# Patient Record
Sex: Male | Born: 1960 | Race: White | Hispanic: No | Smoking: Current some day smoker
Health system: Southern US, Community
[De-identification: ages and names within clinical notes are randomized; demographics above are authoritative.]

## PROBLEM LIST (undated history)

## (undated) DIAGNOSIS — I700 Atherosclerosis of aorta: Secondary | ICD-10-CM

## (undated) DIAGNOSIS — M8448XA Pathological fracture, other site, initial encounter for fracture: Secondary | ICD-10-CM

## (undated) DIAGNOSIS — M25552 Pain in left hip: Secondary | ICD-10-CM

## (undated) DIAGNOSIS — M109 Gout, unspecified: Secondary | ICD-10-CM

## (undated) DIAGNOSIS — H3552 Pigmentary retinal dystrophy: Secondary | ICD-10-CM

## (undated) DIAGNOSIS — K219 Gastro-esophageal reflux disease without esophagitis: Secondary | ICD-10-CM

## (undated) DIAGNOSIS — G8929 Other chronic pain: Secondary | ICD-10-CM

## (undated) DIAGNOSIS — E119 Type 2 diabetes mellitus without complications: Secondary | ICD-10-CM

## (undated) DIAGNOSIS — H547 Unspecified visual loss: Secondary | ICD-10-CM

## (undated) DIAGNOSIS — G4730 Sleep apnea, unspecified: Secondary | ICD-10-CM

## (undated) DIAGNOSIS — I10 Essential (primary) hypertension: Secondary | ICD-10-CM

## (undated) DIAGNOSIS — E785 Hyperlipidemia, unspecified: Secondary | ICD-10-CM

## (undated) DIAGNOSIS — M545 Low back pain: Secondary | ICD-10-CM

## (undated) DIAGNOSIS — F419 Anxiety disorder, unspecified: Secondary | ICD-10-CM

## (undated) HISTORY — DX: Gastro-esophageal reflux disease without esophagitis: K21.9

## (undated) HISTORY — PX: WRIST SURGERY: SHX841

## (undated) HISTORY — DX: Type 2 diabetes mellitus without complications: E11.9

## (undated) HISTORY — DX: Hyperlipidemia, unspecified: E78.5

## (undated) HISTORY — PX: EYE SURGERY: SHX253

## (undated) HISTORY — DX: Essential (primary) hypertension: I10

## (undated) HISTORY — DX: Anxiety disorder, unspecified: F41.9

## (undated) HISTORY — PX: TONSILLECTOMY: SUR1361

## (undated) HISTORY — PX: TIBIA FRACTURE SURGERY: SHX806

## (undated) HISTORY — DX: Atherosclerosis of aorta: I70.0

## (undated) HISTORY — DX: Low back pain: M54.5

## (undated) HISTORY — PX: MULTIPLE TOOTH EXTRACTIONS: SHX2053

## (undated) HISTORY — DX: Pain in left hip: M25.552

## (undated) HISTORY — DX: Other chronic pain: G89.29

## (undated) HISTORY — DX: Hypomagnesemia: E83.42

## (undated) HISTORY — DX: Pathological fracture, other site, initial encounter for fracture: M84.48XA

---

## 1990-10-30 HISTORY — PX: CATARACT EXTRACTION: SUR2

## 1998-06-03 ENCOUNTER — Emergency Department (HOSPITAL_COMMUNITY): Admission: EM | Admit: 1998-06-03 | Discharge: 1998-06-03 | Payer: Self-pay | Admitting: Emergency Medicine

## 1998-09-10 ENCOUNTER — Ambulatory Visit (HOSPITAL_COMMUNITY): Admission: RE | Admit: 1998-09-10 | Discharge: 1998-09-10 | Payer: Self-pay | Admitting: *Deleted

## 1998-09-10 ENCOUNTER — Encounter: Payer: Self-pay | Admitting: *Deleted

## 1999-02-21 ENCOUNTER — Encounter: Payer: Self-pay | Admitting: *Deleted

## 1999-02-21 ENCOUNTER — Ambulatory Visit (HOSPITAL_COMMUNITY): Admission: RE | Admit: 1999-02-21 | Discharge: 1999-02-22 | Payer: Self-pay | Admitting: *Deleted

## 1999-10-31 HISTORY — PX: LAMINECTOMY: SHX219

## 1999-10-31 HISTORY — PX: BACK SURGERY: SHX140

## 1999-12-19 ENCOUNTER — Ambulatory Visit (HOSPITAL_COMMUNITY): Admission: RE | Admit: 1999-12-19 | Discharge: 1999-12-19 | Payer: Self-pay | Admitting: *Deleted

## 1999-12-19 ENCOUNTER — Encounter: Payer: Self-pay | Admitting: *Deleted

## 2000-01-02 ENCOUNTER — Ambulatory Visit (HOSPITAL_COMMUNITY): Admission: RE | Admit: 2000-01-02 | Discharge: 2000-01-03 | Payer: Self-pay | Admitting: *Deleted

## 2000-01-02 ENCOUNTER — Encounter: Payer: Self-pay | Admitting: *Deleted

## 2000-02-22 ENCOUNTER — Encounter: Payer: Self-pay | Admitting: *Deleted

## 2000-02-22 ENCOUNTER — Ambulatory Visit (HOSPITAL_COMMUNITY): Admission: RE | Admit: 2000-02-22 | Discharge: 2000-02-22 | Payer: Self-pay | Admitting: *Deleted

## 2000-06-24 ENCOUNTER — Encounter: Payer: Self-pay | Admitting: *Deleted

## 2000-06-24 ENCOUNTER — Ambulatory Visit (HOSPITAL_COMMUNITY): Admission: RE | Admit: 2000-06-24 | Discharge: 2000-06-24 | Payer: Self-pay | Admitting: *Deleted

## 2000-07-13 ENCOUNTER — Ambulatory Visit (HOSPITAL_COMMUNITY): Admission: RE | Admit: 2000-07-13 | Discharge: 2000-07-13 | Payer: Self-pay | Admitting: *Deleted

## 2000-07-13 ENCOUNTER — Encounter: Payer: Self-pay | Admitting: *Deleted

## 2004-08-22 ENCOUNTER — Ambulatory Visit: Payer: Self-pay | Admitting: Physician Assistant

## 2006-05-24 ENCOUNTER — Emergency Department: Payer: Self-pay | Admitting: Emergency Medicine

## 2006-06-02 ENCOUNTER — Emergency Department: Payer: Self-pay | Admitting: Emergency Medicine

## 2006-07-31 ENCOUNTER — Ambulatory Visit: Payer: Self-pay | Admitting: Specialist

## 2007-09-18 ENCOUNTER — Ambulatory Visit: Payer: Self-pay | Admitting: Specialist

## 2010-02-15 ENCOUNTER — Emergency Department: Payer: Self-pay | Admitting: Unknown Physician Specialty

## 2011-11-18 ENCOUNTER — Emergency Department: Payer: Self-pay | Admitting: Emergency Medicine

## 2011-11-18 LAB — COMPREHENSIVE METABOLIC PANEL
Albumin: 4.6 g/dL (ref 3.4–5.0)
Alkaline Phosphatase: 57 U/L (ref 50–136)
Anion Gap: 10 (ref 7–16)
BUN: 9 mg/dL (ref 7–18)
Bilirubin,Total: 0.5 mg/dL (ref 0.2–1.0)
Calcium, Total: 8.6 mg/dL (ref 8.5–10.1)
Chloride: 103 mmol/L (ref 98–107)
Co2: 30 mmol/L (ref 21–32)
Creatinine: 0.94 mg/dL (ref 0.60–1.30)
EGFR (African American): >60
EGFR (Non-African Amer.): >60
Glucose: 121 mg/dL — ABNORMAL HIGH (ref 65–99)
Osmolality: 285 (ref 275–301)
Potassium: 3.7 mmol/L (ref 3.5–5.1)
SGOT(AST): 56 U/L — ABNORMAL HIGH (ref 15–37)
SGPT (ALT): 52 U/L
Sodium: 143 mmol/L (ref 136–145)
Total Protein: 8.2 g/dL (ref 6.4–8.2)

## 2011-11-18 LAB — CBC
HCT: 45.8 % (ref 40.0–52.0)
HGB: 15.9 g/dL (ref 13.0–18.0)
MCH: 30.8 pg (ref 26.0–34.0)
MCHC: 34.8 g/dL (ref 32.0–36.0)
MCV: 89 fL (ref 80–100)
Platelet: 211 10*3/uL (ref 150–440)
RBC: 5.16 10*6/uL (ref 4.40–5.90)
RDW: 12.9 % (ref 11.5–14.5)
WBC: 9.7 10*3/uL (ref 3.8–10.6)

## 2011-11-18 LAB — ETHANOL
Ethanol %: 0.245 % — ABNORMAL HIGH (ref 0.000–0.080)
Ethanol: 245 mg/dL

## 2011-11-18 LAB — TSH: Thyroid Stimulating Horm: 2.79 u[IU]/mL

## 2011-11-19 LAB — URINALYSIS, COMPLETE
Bacteria: NONE SEEN
Bilirubin,UR: NEGATIVE
Blood: NEGATIVE
Glucose,UR: NEGATIVE mg/dL (ref 0–75)
Ketone: NEGATIVE
Leukocyte Esterase: NEGATIVE
Nitrite: NEGATIVE
Ph: 5 (ref 4.5–8.0)
Protein: NEGATIVE
RBC,UR: NONE SEEN /HPF (ref 0–5)
Specific Gravity: 1.002 (ref 1.003–1.030)
Squamous Epithelial: NONE SEEN
WBC UR: <1 /HPF (ref 0–5)

## 2011-11-19 LAB — DRUG SCREEN, URINE

## 2012-04-19 ENCOUNTER — Ambulatory Visit: Payer: Self-pay | Admitting: Unknown Physician Specialty

## 2012-04-22 LAB — PATHOLOGY REPORT

## 2012-08-23 ENCOUNTER — Ambulatory Visit: Payer: Self-pay | Admitting: Pain Medicine

## 2013-11-17 ENCOUNTER — Ambulatory Visit: Payer: Self-pay | Admitting: Unknown Physician Specialty

## 2013-11-20 LAB — PATHOLOGY REPORT

## 2014-12-11 DIAGNOSIS — E1169 Type 2 diabetes mellitus with other specified complication: Secondary | ICD-10-CM | POA: Diagnosis not present

## 2014-12-11 DIAGNOSIS — M545 Low back pain: Secondary | ICD-10-CM | POA: Diagnosis not present

## 2014-12-11 DIAGNOSIS — I10 Essential (primary) hypertension: Secondary | ICD-10-CM | POA: Diagnosis not present

## 2014-12-11 DIAGNOSIS — G8929 Other chronic pain: Secondary | ICD-10-CM | POA: Diagnosis not present

## 2014-12-11 DIAGNOSIS — E781 Pure hyperglyceridemia: Secondary | ICD-10-CM | POA: Diagnosis not present

## 2014-12-28 DIAGNOSIS — Z79899 Other long term (current) drug therapy: Secondary | ICD-10-CM | POA: Diagnosis not present

## 2014-12-28 DIAGNOSIS — M47816 Spondylosis without myelopathy or radiculopathy, lumbar region: Secondary | ICD-10-CM | POA: Diagnosis not present

## 2014-12-28 DIAGNOSIS — M545 Low back pain: Secondary | ICD-10-CM | POA: Diagnosis not present

## 2014-12-28 DIAGNOSIS — M25562 Pain in left knee: Secondary | ICD-10-CM | POA: Diagnosis not present

## 2014-12-28 DIAGNOSIS — F419 Anxiety disorder, unspecified: Secondary | ICD-10-CM | POA: Diagnosis not present

## 2014-12-28 DIAGNOSIS — G8929 Other chronic pain: Secondary | ICD-10-CM | POA: Diagnosis not present

## 2014-12-30 DIAGNOSIS — M160 Bilateral primary osteoarthritis of hip: Secondary | ICD-10-CM | POA: Diagnosis not present

## 2014-12-30 DIAGNOSIS — M47816 Spondylosis without myelopathy or radiculopathy, lumbar region: Secondary | ICD-10-CM | POA: Diagnosis not present

## 2014-12-30 DIAGNOSIS — M25562 Pain in left knee: Secondary | ICD-10-CM | POA: Diagnosis not present

## 2014-12-30 DIAGNOSIS — G8929 Other chronic pain: Secondary | ICD-10-CM | POA: Diagnosis not present

## 2015-01-06 DIAGNOSIS — M4806 Spinal stenosis, lumbar region: Secondary | ICD-10-CM | POA: Diagnosis not present

## 2015-01-06 DIAGNOSIS — M2578 Osteophyte, vertebrae: Secondary | ICD-10-CM | POA: Diagnosis not present

## 2015-01-06 DIAGNOSIS — M545 Low back pain: Secondary | ICD-10-CM | POA: Diagnosis not present

## 2015-01-06 DIAGNOSIS — M5440 Lumbago with sciatica, unspecified side: Secondary | ICD-10-CM | POA: Diagnosis not present

## 2015-01-06 DIAGNOSIS — M47816 Spondylosis without myelopathy or radiculopathy, lumbar region: Secondary | ICD-10-CM | POA: Diagnosis not present

## 2015-01-06 DIAGNOSIS — M1990 Unspecified osteoarthritis, unspecified site: Secondary | ICD-10-CM | POA: Diagnosis not present

## 2015-02-02 DIAGNOSIS — Z5181 Encounter for therapeutic drug level monitoring: Secondary | ICD-10-CM | POA: Diagnosis not present

## 2015-02-02 DIAGNOSIS — G8929 Other chronic pain: Secondary | ICD-10-CM | POA: Diagnosis not present

## 2015-02-02 DIAGNOSIS — F1120 Opioid dependence, uncomplicated: Secondary | ICD-10-CM | POA: Diagnosis not present

## 2015-02-02 DIAGNOSIS — Z79891 Long term (current) use of opiate analgesic: Secondary | ICD-10-CM | POA: Diagnosis not present

## 2015-02-02 DIAGNOSIS — R748 Abnormal levels of other serum enzymes: Secondary | ICD-10-CM | POA: Diagnosis not present

## 2015-02-16 DIAGNOSIS — M5416 Radiculopathy, lumbar region: Secondary | ICD-10-CM | POA: Diagnosis not present

## 2015-02-16 DIAGNOSIS — M25559 Pain in unspecified hip: Secondary | ICD-10-CM | POA: Diagnosis not present

## 2015-02-16 DIAGNOSIS — G9619 Other disorders of meninges, not elsewhere classified: Secondary | ICD-10-CM | POA: Diagnosis not present

## 2015-02-16 DIAGNOSIS — Z79891 Long term (current) use of opiate analgesic: Secondary | ICD-10-CM | POA: Diagnosis not present

## 2015-02-16 DIAGNOSIS — M545 Low back pain: Secondary | ICD-10-CM | POA: Diagnosis not present

## 2015-02-16 DIAGNOSIS — G8929 Other chronic pain: Secondary | ICD-10-CM | POA: Diagnosis not present

## 2015-03-11 DIAGNOSIS — E785 Hyperlipidemia, unspecified: Secondary | ICD-10-CM | POA: Diagnosis not present

## 2015-03-11 DIAGNOSIS — E669 Obesity, unspecified: Secondary | ICD-10-CM | POA: Diagnosis not present

## 2015-03-11 DIAGNOSIS — I10 Essential (primary) hypertension: Secondary | ICD-10-CM | POA: Diagnosis not present

## 2015-03-11 DIAGNOSIS — E781 Pure hyperglyceridemia: Secondary | ICD-10-CM | POA: Diagnosis not present

## 2015-03-11 DIAGNOSIS — E119 Type 2 diabetes mellitus without complications: Secondary | ICD-10-CM | POA: Diagnosis not present

## 2015-03-17 DIAGNOSIS — M94251 Chondromalacia, right hip: Secondary | ICD-10-CM | POA: Diagnosis not present

## 2015-03-17 DIAGNOSIS — M8700 Idiopathic aseptic necrosis of unspecified bone: Secondary | ICD-10-CM | POA: Diagnosis not present

## 2015-03-17 DIAGNOSIS — M87051 Idiopathic aseptic necrosis of right femur: Secondary | ICD-10-CM | POA: Diagnosis not present

## 2015-03-17 DIAGNOSIS — M94252 Chondromalacia, left hip: Secondary | ICD-10-CM | POA: Diagnosis not present

## 2015-03-17 DIAGNOSIS — R609 Edema, unspecified: Secondary | ICD-10-CM | POA: Diagnosis not present

## 2015-03-17 DIAGNOSIS — M87052 Idiopathic aseptic necrosis of left femur: Secondary | ICD-10-CM | POA: Diagnosis not present

## 2015-06-11 ENCOUNTER — Ambulatory Visit (INDEPENDENT_AMBULATORY_CARE_PROVIDER_SITE_OTHER): Payer: Medicare Other | Admitting: Family Medicine

## 2015-06-11 ENCOUNTER — Encounter: Payer: Self-pay | Admitting: Family Medicine

## 2015-06-11 VITALS — BP 128/82 | HR 59 | Temp 97.9°F | Resp 20 | Ht 68.0 in | Wt 246.7 lb

## 2015-06-11 DIAGNOSIS — E119 Type 2 diabetes mellitus without complications: Secondary | ICD-10-CM | POA: Diagnosis not present

## 2015-06-11 DIAGNOSIS — G8929 Other chronic pain: Secondary | ICD-10-CM

## 2015-06-11 DIAGNOSIS — E1140 Type 2 diabetes mellitus with diabetic neuropathy, unspecified: Secondary | ICD-10-CM | POA: Insufficient documentation

## 2015-06-11 DIAGNOSIS — E785 Hyperlipidemia, unspecified: Secondary | ICD-10-CM | POA: Diagnosis not present

## 2015-06-11 DIAGNOSIS — G4701 Insomnia due to medical condition: Secondary | ICD-10-CM

## 2015-06-11 DIAGNOSIS — F5105 Insomnia due to other mental disorder: Secondary | ICD-10-CM | POA: Insufficient documentation

## 2015-06-11 DIAGNOSIS — I10 Essential (primary) hypertension: Secondary | ICD-10-CM | POA: Insufficient documentation

## 2015-06-11 DIAGNOSIS — H3552 Pigmentary retinal dystrophy: Secondary | ICD-10-CM | POA: Insufficient documentation

## 2015-06-11 DIAGNOSIS — M5416 Radiculopathy, lumbar region: Secondary | ICD-10-CM

## 2015-06-11 DIAGNOSIS — F411 Generalized anxiety disorder: Secondary | ICD-10-CM | POA: Diagnosis not present

## 2015-06-11 DIAGNOSIS — F419 Anxiety disorder, unspecified: Secondary | ICD-10-CM | POA: Insufficient documentation

## 2015-06-11 DIAGNOSIS — E781 Pure hyperglyceridemia: Secondary | ICD-10-CM | POA: Diagnosis not present

## 2015-06-11 DIAGNOSIS — IMO0001 Reserved for inherently not codable concepts without codable children: Secondary | ICD-10-CM | POA: Insufficient documentation

## 2015-06-11 MED ORDER — ATENOLOL 100 MG PO TABS: 100.0000 mg | ORAL_TABLET | Freq: Every day | ORAL | Status: DC

## 2015-06-11 MED ORDER — TIZANIDINE HCL 4 MG PO TABS: 4.0000 mg | ORAL_TABLET | Freq: Every day | ORAL | Status: DC

## 2015-06-11 MED ORDER — LISINOPRIL 2.5 MG PO TABS: 2.5000 mg | ORAL_TABLET | Freq: Every day | ORAL | Status: DC

## 2015-06-11 MED ORDER — CLONAZEPAM 0.5 MG PO TABS: 0.5000 mg | ORAL_TABLET | Freq: Every evening | ORAL | Status: DC | PRN

## 2015-06-11 MED ORDER — OMEGA-3-ACID ETHYL ESTERS 1 G PO CAPS: 2.0000 g | ORAL_CAPSULE | Freq: Two times a day (BID) | ORAL | Status: DC

## 2015-06-11 MED ORDER — ATORVASTATIN CALCIUM 20 MG PO TABS: 20.0000 mg | ORAL_TABLET | Freq: Every day | ORAL | Status: DC

## 2015-06-11 MED ORDER — ESOMEPRAZOLE MAGNESIUM 40 MG PO CPDR: 40.0000 mg | DELAYED_RELEASE_CAPSULE | Freq: Every day | ORAL | Status: DC

## 2015-06-11 MED ORDER — METFORMIN HCL 1000 MG PO TABS: 1000.0000 mg | ORAL_TABLET | Freq: Two times a day (BID) | ORAL | Status: DC

## 2015-06-11 MED ORDER — CLONAZEPAM 1 MG PO TABS: 1.0000 mg | ORAL_TABLET | Freq: Two times a day (BID) | ORAL | Status: DC | PRN

## 2015-06-11 NOTE — Progress Notes (Signed)
Name: Joel Bryant   MRN: [id number]    DOB: Jan 31, 1961   Date:06/11/2015       Progress Note  Subjective  Chief Complaint  Chief Complaint  Patient presents with  . Follow-up    3 mo/medication refill  . Anxiety  . Hyperlipidemia  . Diabetes    Anxiety Presents for follow-up visit. The problem has been unchanged (Pt. was prescribed Clonazepam 0.5mg  BID at his last visit and he was originally on Clonazepam 1mg  BID PRN.). Symptoms include excessive worry and insomnia (difficulty falling asleep, but clonazepam helps with insomnia.). Patient reports no chest pain, nausea, palpitations, panic or shortness of breath. The severity of symptoms is moderate.   Past treatments include benzodiazephines. The treatment provided moderate relief. Compliance with prior treatments has been good.  Hyperlipidemia This is a chronic problem. Recent lipid tests were reviewed and are high. Exacerbating diseases include diabetes and obesity. Pertinent negatives include no chest pain or shortness of breath. Current antihyperlipidemic treatment includes statins.  Diabetes He presents for his follow-up diabetic visit. He has type 2 diabetes mellitus. Pertinent negatives for hypoglycemia include no headaches. Pertinent negatives for diabetes include no chest pain, no foot paresthesias, no polydipsia and no polyuria. Pertinent negatives for diabetic complications include no CVA. Current diabetic treatment includes oral agent (monotherapy). His weight is stable. An ACE inhibitor/angiotensin II receptor blocker is being taken. Eye exam is current.  Hypertension This is a chronic problem. The problem is controlled. Associated symptoms include anxiety. Pertinent negatives include no chest pain, headaches, palpitations or shortness of breath. Past treatments include beta blockers and ACE inhibitors. There is no history of kidney disease, CAD/MI or CVA.  Heartburn He complains of heartburn. He reports no abdominal pain, no  chest pain, no choking, no nausea or no sore throat. The symptoms are aggravated by certain foods. Risk factors include obesity. He has tried a PPI for the symptoms. The treatment provided significant relief.  Back Pain This is a chronic problem. The pain is present in the gluteal and lumbar spine. The pain radiates to the left thigh and right thigh. The pain is at a severity of 4/10. The pain is moderate. The symptoms are aggravated by position and bending. Pertinent negatives include no abdominal pain, bladder incontinence, chest pain or headaches. He has tried analgesics and muscle relaxant (Pt. is on Morphine by Pain Management) for the symptoms. The treatment provided moderate relief.     Past Medical History  Diagnosis Date  . Diabetes mellitus without complication   . Anxiety   . Hyperlipidemia   . Hypertension     Past Surgical History  Procedure Laterality Date  . Laminectomy  2001    lumbar  . Cataract extraction  1992    Insert Prosthetic Lens    Family History  Problem Relation Age of Onset  . Colon polyps Mother   . Retinitis pigmentosa Mother   . Retinitis pigmentosa Maternal Grandmother   . Cancer Maternal Grandfather   . Diabetes Paternal Grandmother     Social History   Social History  . Marital Status: Divorced    Spouse Name: N/A  . Number of Children: N/A  . Years of Education: N/A   Occupational History  . Not on file.   Social History Main Topics  . Smoking status: Former Smoker    Types: Cigarettes  . Smokeless tobacco: Never Used     Comment: Quit smoking 4 years now  . Alcohol Use: 0.0 oz/week  0 Standard drinks or equivalent per week     Comment: Sober 1 month and 12 days   . Drug Use: No  . Sexual Activity: Not on file   Other Topics Concern  . Not on file   Social History Narrative  . No narrative on file     Current outpatient prescriptions:  .  atenolol (TENORMIN) 100 MG tablet, Take 1 tablet by mouth daily., Disp: , Rfl:    .  atorvastatin (LIPITOR) 20 MG tablet, Take 1 tablet by mouth at bedtime., Disp: , Rfl:  .  clonazePAM (KLONOPIN) 1 MG tablet, Take 1 tablet by mouth 2 (two) times daily as needed., Disp: , Rfl:  .  esomeprazole (NEXIUM) 40 MG capsule, Take 1 capsule by mouth daily., Disp: , Rfl:  .  furosemide (LASIX) 40 MG tablet, Take by mouth., Disp: , Rfl:  .  lisinopril (PRINIVIL,ZESTRIL) 2.5 MG tablet, Take 1 tablet by mouth daily., Disp: , Rfl:  .  metFORMIN (GLUCOPHAGE) 1000 MG tablet, Take 1 tablet by mouth 2 (two) times daily., Disp: , Rfl:  .  omega-3 acid ethyl esters (LOVAZA) 1 G capsule, Take 2 capsules by mouth 2 (two) times daily., Disp: , Rfl:  .  tiZANidine (ZANAFLEX) 4 MG tablet, Take 1 tablet by mouth daily., Disp: , Rfl:   No Known Allergies   Review of Systems  HENT: Negative for sore throat.   Respiratory: Negative for choking and shortness of breath.   Cardiovascular: Negative for chest pain and palpitations.  Gastrointestinal: Positive for heartburn. Negative for nausea and abdominal pain.  Genitourinary: Negative for bladder incontinence.  Musculoskeletal: Positive for back pain.  Neurological: Negative for headaches.  Endo/Heme/Allergies: Negative for polydipsia.  Psychiatric/Behavioral: The patient has insomnia (difficulty falling asleep, but clonazepam helps with insomnia.).      Objective  Filed Vitals:   06/11/15 1112  BP: 128/82  Pulse: 59  Temp: 97.9 F (36.6 C)  TempSrc: Oral  Resp: 20  Height: 5\' 8"  (1.727 m)  Weight: 246 lb 11.2 oz (111.902 kg)  SpO2: 96%    Physical Exam  Constitutional: He is oriented to person, place, and time and well-developed, well-nourished, and in no distress.  Cardiovascular: Normal rate and regular rhythm.   Pulmonary/Chest: Effort normal and breath sounds normal.  Abdominal: Soft. Bowel sounds are normal.  Musculoskeletal:       Back:  Neurological: He is alert and oriented to person, place, and time.  Skin: Skin is  warm and dry.  Psychiatric: Memory, affect and judgment normal.  Nursing note and vitals reviewed.   Assessment & Plan  1. Essential hypertension BP stable and controlled on present therapy. - atenolol (TENORMIN) 100 MG tablet; Take 1 tablet (100 mg total) by mouth daily.  Dispense: 90 tablet; Refill: 1  2. Diabetes mellitus without complication  - lisinopril (PRINIVIL,ZESTRIL) 2.5 MG tablet; Take 1 tablet (2.5 mg total) by mouth daily.  Dispense: 90 tablet; Refill: 1 - metFORMIN (GLUCOPHAGE) 1000 MG tablet; Take 1 tablet (1,000 mg total) by mouth 2 (two) times daily.  Dispense: 180 tablet; Refill: 1 - HgB A1c  3. Generalized anxiety disorder Restarted back on clonazepam 1 mg twice a day as needed. Patient is educated in detail about the potential interactions between benzodiazepines and opioids (he is on morphine, prescribed by pain clinic for chronic low back pain). He is also aware of the dependence potential of benzodiazepines and is taking the medication as directed and only as needed. Refills provided  and follow-up in 3 months. - clonazePAM (KLONOPIN) 1 MG tablet; Take 1 tablet (1 mg total) by mouth 2 (two) times daily as needed.  Dispense: 60 tablet; Refill: 2  4. Hypertriglyceridemia  - omega-3 acid ethyl esters (LOVAZA) 1 G capsule; Take 2 capsules (2 g total) by mouth 2 (two) times daily.  Dispense: 360 capsule; Refill: 1  5. Dyslipidemia  - atorvastatin (LIPITOR) 20 MG tablet; Take 1 tablet (20 mg total) by mouth daily at 6 PM.  Dispense: 90 tablet; Refill: 1 - Lipid Profile - Comprehensive metabolic panel  6. Lumbar radiculopathy Followed by pain clinic and on morphine. Takes tizanidine for relief of muscle spasm. - tiZANidine (ZANAFLEX) 4 MG tablet; Take 1 tablet (4 mg total) by mouth daily.  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 He is on clonazepam  1 mg twice a day for anxiety. He is aware of the dependence potential and the interactions of benzodiazepines especially with opioids. Follow-up in 3 months. - clonazePAM (KLONOPIN) 0.5 MG tablet; Take 1 tablet (0.5 mg total) by mouth at bedtime as needed for anxiety.  Dispense: 30 tablet; Refill: 2   Dillion Stowers [facility] 06/11/2015 11:31 AM

## 2015-07-15 ENCOUNTER — Telehealth: Payer: Self-pay | Admitting: Family Medicine

## 2015-07-15 NOTE — Telephone Encounter (Signed)
Completely out Furosemide. Please send to [facility]. Last seen on 03-11-15 next appointment 09-13-15

## 2015-07-19 ENCOUNTER — Other Ambulatory Visit: Payer: Self-pay | Admitting: Family Medicine

## 2015-07-19 DIAGNOSIS — R600 Localized edema: Secondary | ICD-10-CM

## 2015-07-19 MED ORDER — FUROSEMIDE 40 MG PO TABS: 40.0000 mg | ORAL_TABLET | Freq: Every day | ORAL | Status: DC

## 2015-07-19 NOTE — Telephone Encounter (Signed)
Patient completely out requesting refill.Joel Bryant

## 2015-07-19 NOTE — Telephone Encounter (Signed)
Pt said that they have been calling last week to try and get his fluid pills refilled. He is out of them and needs them. phamr is [facility]

## 2015-07-21 NOTE — Telephone Encounter (Signed)
Medication has been refilled and sent to [facility] on 07/19/2015 by Dr. Manuella Ghazi

## 2015-09-13 ENCOUNTER — Ambulatory Visit (INDEPENDENT_AMBULATORY_CARE_PROVIDER_SITE_OTHER): Payer: Medicare Other | Admitting: Family Medicine

## 2015-09-13 ENCOUNTER — Encounter: Payer: Self-pay | Admitting: Family Medicine

## 2015-09-13 ENCOUNTER — Ambulatory Visit: Payer: Medicare Other | Admitting: Family Medicine

## 2015-09-13 VITALS — BP 128/80 | HR 60 | Temp 98.5°F | Resp 16 | Ht 68.0 in | Wt 237.0 lb

## 2015-09-13 DIAGNOSIS — F411 Generalized anxiety disorder: Secondary | ICD-10-CM | POA: Diagnosis not present

## 2015-09-13 DIAGNOSIS — M545 Low back pain, unspecified: Secondary | ICD-10-CM

## 2015-09-13 DIAGNOSIS — E785 Hyperlipidemia, unspecified: Secondary | ICD-10-CM

## 2015-09-13 DIAGNOSIS — I10 Essential (primary) hypertension: Secondary | ICD-10-CM

## 2015-09-13 DIAGNOSIS — E1165 Type 2 diabetes mellitus with hyperglycemia: Secondary | ICD-10-CM | POA: Diagnosis not present

## 2015-09-13 DIAGNOSIS — IMO0001 Reserved for inherently not codable concepts without codable children: Secondary | ICD-10-CM

## 2015-09-13 DIAGNOSIS — G8929 Other chronic pain: Secondary | ICD-10-CM

## 2015-09-13 DIAGNOSIS — Z23 Encounter for immunization: Secondary | ICD-10-CM

## 2015-09-13 DIAGNOSIS — G4701 Insomnia due to medical condition: Secondary | ICD-10-CM | POA: Diagnosis not present

## 2015-09-13 DIAGNOSIS — R600 Localized edema: Secondary | ICD-10-CM | POA: Insufficient documentation

## 2015-09-13 DIAGNOSIS — M109 Gout, unspecified: Secondary | ICD-10-CM | POA: Insufficient documentation

## 2015-09-13 DIAGNOSIS — G4733 Obstructive sleep apnea (adult) (pediatric): Secondary | ICD-10-CM | POA: Insufficient documentation

## 2015-09-13 HISTORY — DX: Low back pain, unspecified: M54.50

## 2015-09-13 HISTORY — DX: Low back pain, unspecified: G89.29

## 2015-09-13 LAB — GLUCOSE, POCT (MANUAL RESULT ENTRY): POC Glucose: 222 mg/dl — AB (ref 70–99)

## 2015-09-13 LAB — POCT GLYCOSYLATED HEMOGLOBIN (HGB A1C): Hemoglobin A1C: 8.6

## 2015-09-13 MED ORDER — SITAGLIPTIN PHOSPHATE 50 MG PO TABS: 50.0000 mg | ORAL_TABLET | Freq: Every day | ORAL | Status: DC

## 2015-09-13 MED ORDER — CLONAZEPAM 0.5 MG PO TABS: 0.5000 mg | ORAL_TABLET | Freq: Every evening | ORAL | Status: DC | PRN

## 2015-09-13 MED ORDER — CLONAZEPAM 1 MG PO TABS: 1.0000 mg | ORAL_TABLET | Freq: Two times a day (BID) | ORAL | Status: DC | PRN

## 2015-09-13 NOTE — Progress Notes (Signed)
Name: Joel Bryant   MRN: [id number]    DOB: September 27, 1961   Date:09/13/2015       Progress Note  Subjective  Chief Complaint  Chief Complaint  Patient presents with  . Follow-up    3 mo  . Diabetes  . Hyperlipidemia  . Medication Refill    Diabetes He presents for his follow-up diabetic visit. He has type 2 diabetes mellitus. His disease course has been worsening (BG increaed in September to over 400, coupled with foot swelling. This coincided with patient's decision to quit alcohol and reduce consumption of sugars.). Hypoglycemia symptoms include headaches (having headaches with elevated sugars) and nervousness/anxiousness. Pertinent negatives for diabetes include no chest pain, no fatigue, no polydipsia and no polyuria. Pertinent negatives for diabetic complications include no CVA. Current diabetic treatment includes oral agent (monotherapy). His weight is stable. He is following a diabetic and generally healthy diet. His breakfast blood glucose range is generally >200 mg/dl. An ACE inhibitor/angiotensin II receptor blocker is being taken.  Hyperlipidemia This is a chronic problem. The problem is controlled. Exacerbating diseases include obesity. Pertinent negatives include no chest pain, leg pain, myalgias or shortness of breath. Current antihyperlipidemic treatment includes statins.  Hypertension This is a chronic problem. The problem is controlled. Associated symptoms include anxiety and headaches (having headaches with elevated sugars). Pertinent negatives include no chest pain, palpitations or shortness of breath. Past treatments include beta blockers and ACE inhibitors. There is no history of kidney disease, CAD/MI or CVA.  Anxiety Presents for follow-up visit. Symptoms include insomnia and nervous/anxious behavior. Patient reports no chest pain, depressed mood, palpitations or shortness of breath. The quality of sleep is good.   Past treatments include benzodiazephines. The treatment  provided significant relief. Compliance with prior treatments has been good.      Past Medical History  Diagnosis Date  . Diabetes mellitus without complication ([place])   . Anxiety   . Hyperlipidemia   . Hypertension     Past Surgical History  Procedure Laterality Date  . Laminectomy  2001    lumbar  . Cataract extraction  1992    Insert Prosthetic Lens    Family History  Problem Relation Age of Onset  . Colon polyps Mother   . Retinitis pigmentosa Mother   . Retinitis pigmentosa Maternal Grandmother   . Cancer Maternal Grandfather   . Diabetes Paternal Grandmother     Social History   Social History  . Marital Status: Divorced    Spouse Name: N/A  . Number of Children: N/A  . Years of Education: N/A   Occupational History  . Not on file.   Social History Main Topics  . Smoking status: Former Smoker    Types: Cigarettes  . Smokeless tobacco: Never Used     Comment: Quit smoking 4 years now  . Alcohol Use: 0.0 oz/week    0 Standard drinks or equivalent per week     Comment: Sober 1 month and 12 days   . Drug Use: No  . Sexual Activity: Not on file   Other Topics Concern  . Not on file   Social History Narrative     Current outpatient prescriptions:  .  atenolol (TENORMIN) 100 MG tablet, Take 1 tablet (100 mg total) by mouth daily., Disp: 90 tablet, Rfl: 1 .  atorvastatin (LIPITOR) 20 MG tablet, Take 1 tablet (20 mg total) by mouth daily at 6 PM., Disp: 90 tablet, Rfl: 1 .  clonazePAM (KLONOPIN) 0.5 MG tablet, Take  1 tablet (0.5 mg total) by mouth at bedtime as needed for anxiety., Disp: 30 tablet, Rfl: 2 .  clonazePAM (KLONOPIN) 1 MG tablet, Take 1 tablet (1 mg total) by mouth 2 (two) times daily as needed., Disp: 60 tablet, Rfl: 2 .  esomeprazole (NEXIUM) 40 MG capsule, Take 1 capsule (40 mg total) by mouth daily., Disp: 90 capsule, Rfl: 1 .  furosemide (LASIX) 40 MG tablet, Take 1 tablet (40 mg total) by mouth daily., Disp: 30 tablet, Rfl: 2 .   lisinopril (PRINIVIL,ZESTRIL) 2.5 MG tablet, Take 1 tablet (2.5 mg total) by mouth daily., Disp: 90 tablet, Rfl: 1 .  metFORMIN (GLUCOPHAGE) 1000 MG tablet, Take 1 tablet (1,000 mg total) by mouth 2 (two) times daily., Disp: 180 tablet, Rfl: 1 .  omega-3 acid ethyl esters (LOVAZA) 1 G capsule, Take 2 capsules (2 g total) by mouth 2 (two) times daily., Disp: 360 capsule, Rfl: 1 .  tiZANidine (ZANAFLEX) 4 MG tablet, Take 1 tablet (4 mg total) by mouth daily., Disp: 90 tablet, Rfl: 1  No Known Allergies   Review of Systems  Constitutional: Negative for fatigue.  Respiratory: Negative for shortness of breath.   Cardiovascular: Negative for chest pain and palpitations.  Gastrointestinal: Negative for abdominal pain.  Musculoskeletal: Negative for myalgias.  Neurological: Positive for headaches (having headaches with elevated sugars).  Endo/Heme/Allergies: Negative for polydipsia.  Psychiatric/Behavioral: The patient is nervous/anxious and has insomnia.     Objective  Filed Vitals:   09/13/15 0919  BP: 128/80  Pulse: 60  Temp: 98.5 F (36.9 C)  TempSrc: Oral  Resp: 16  Height: 5\' 8"  (1.727 m)  Weight: 237 lb (107.502 kg)  SpO2: 97%    Physical Exam  Constitutional: He is oriented to person, place, and time and well-developed, well-nourished, and in no distress.  HENT:  Head: Normocephalic and atraumatic.  Cardiovascular: Normal rate, regular rhythm and normal heart sounds.   Pulmonary/Chest: Effort normal and breath sounds normal.  Abdominal: Soft. Bowel sounds are normal. There is no tenderness.  Neurological: He is alert and oriented to person, place, and time.  Skin: Skin is warm and dry.  Psychiatric: Mood, memory, affect and judgment normal.  Nursing note and vitals reviewed.   Recent Results (from the past 2160 hour(s))  POCT HgB A1C     Status: Abnormal   Collection Time: 09/13/15  9:30 AM  Result Value Ref Range   Hemoglobin A1C 8.6   POCT Glucose (CBG)      Status: Abnormal   Collection Time: 09/13/15  9:30 AM  Result Value Ref Range   POC Glucose 222 (A) 70 - 99 mg/dl     Assessment & Plan  1. Need for immunization against influenza  - Flu Vaccine QUAD 36+ mos PF IM (Fluarix & Fluzone Quad PF)  2. Essential hypertension BP stable and controlled on present therapy.  3. Uncontrolled type 2 diabetes mellitus without complication, without long-term current use of insulin (HCC) Worse with elevated blood sugars. Unclear etiology. He shouldn't has quit alcohol and has decreased sugar consumption and diabetes has gotten worse. We will start on Januvia 50 mg daily. Educated on potential adverse effects. Encouraged to adopt a regular exercise routine. Recheck in 3-4 months. - POCT HgB A1C - POCT Glucose (CBG) - sitaGLIPtin (JANUVIA) 50 MG tablet; Take 1 tablet (50 mg total) by mouth daily.  Dispense: 90 tablet; Refill: 0  4. Generalized anxiety disorder Stable on benzodiazepine therapy. Refills provided. - clonazePAM (KLONOPIN) 1 MG  tablet; Take 1 tablet (1 mg total) by mouth 2 (two) times daily as needed.  Dispense: 180 tablet; Refill: 0  5. Insomnia secondary to chronic pain Stable and responsive to benzodiazepine therapy. Patient aware of the dependence potential associated with benzodiazepines, there are side effects especially in relation to opioid use. He is compliant with the controlled substances agreement - clonazePAM (KLONOPIN) 0.5 MG tablet; Take 1 tablet (0.5 mg total) by mouth at bedtime as needed for anxiety.  Dispense: 90 tablet; Refill: 0  6. Dyslipidemia  - Lipid Profile - Comprehensive Metabolic Panel (CMET)   Aundra Espin [facility] 09/13/2015 9:50 AM

## 2015-09-14 LAB — LIPID PANEL
Chol/HDL Ratio: 3.3 ratio units (ref 0.0–5.0)
Cholesterol, Total: 136 mg/dL (ref 100–199)
HDL: 41 mg/dL (ref >39)
LDL Calculated: 45 mg/dL (ref 0–99)
Triglycerides: 251 mg/dL — ABNORMAL HIGH (ref 0–149)
VLDL Cholesterol Cal: 50 mg/dL — ABNORMAL HIGH (ref 5–40)

## 2015-09-14 LAB — COMPREHENSIVE METABOLIC PANEL
ALT: 31 IU/L (ref 0–44)
AST: 46 IU/L — ABNORMAL HIGH (ref 0–40)
Albumin/Globulin Ratio: 2.2 (ref 1.1–2.5)
Albumin: 4.9 g/dL (ref 3.5–5.5)
Alkaline Phosphatase: 69 IU/L (ref 39–117)
BUN/Creatinine Ratio: 13 (ref 9–20)
BUN: 8 mg/dL (ref 6–24)
Bilirubin Total: 1.1 mg/dL (ref 0.0–1.2)
CO2: 25 mmol/L (ref 18–29)
Calcium: 9.5 mg/dL (ref 8.7–10.2)
Chloride: 92 mmol/L — ABNORMAL LOW (ref 97–106)
Creatinine, Ser: 0.62 mg/dL — ABNORMAL LOW (ref 0.76–1.27)
GFR calc Af Amer: 129 mL/min/{1.73_m2} (ref >59)
GFR calc non Af Amer: 112 mL/min/{1.73_m2} (ref >59)
Globulin, Total: 2.2 g/dL (ref 1.5–4.5)
Glucose: 216 mg/dL — ABNORMAL HIGH (ref 65–99)
Potassium: 5 mmol/L (ref 3.5–5.2)
Sodium: 138 mmol/L (ref 136–144)
Total Protein: 7.1 g/dL (ref 6.0–8.5)

## 2015-09-15 ENCOUNTER — Encounter: Payer: Self-pay | Admitting: Pain Medicine

## 2015-09-22 ENCOUNTER — Encounter: Payer: Self-pay | Admitting: Pain Medicine

## 2015-09-27 ENCOUNTER — Ambulatory Visit: Payer: Medicare Other | Attending: Pain Medicine | Admitting: Pain Medicine

## 2015-09-27 ENCOUNTER — Encounter: Payer: Self-pay | Admitting: Pain Medicine

## 2015-09-27 ENCOUNTER — Other Ambulatory Visit
Admission: RE | Admit: 2015-09-27 | Discharge: 2015-09-27 | Disposition: A | Discharge status: Home / Self Care | Payer: Medicare Other | Source: Ambulatory Visit | Attending: Pain Medicine | Admitting: Pain Medicine

## 2015-09-27 ENCOUNTER — Other Ambulatory Visit: Payer: Self-pay | Admitting: Pain Medicine

## 2015-09-27 VITALS — BP 132/79 | HR 58 | Temp 97.9°F | Resp 18 | Ht 69.0 in | Wt 230.0 lb

## 2015-09-27 DIAGNOSIS — G8929 Other chronic pain: Secondary | ICD-10-CM | POA: Diagnosis not present

## 2015-09-27 DIAGNOSIS — E785 Hyperlipidemia, unspecified: Secondary | ICD-10-CM | POA: Insufficient documentation

## 2015-09-27 DIAGNOSIS — Z5181 Encounter for therapeutic drug level monitoring: Secondary | ICD-10-CM | POA: Insufficient documentation

## 2015-09-27 DIAGNOSIS — Z79899 Other long term (current) drug therapy: Secondary | ICD-10-CM

## 2015-09-27 DIAGNOSIS — M25559 Pain in unspecified hip: Secondary | ICD-10-CM | POA: Diagnosis not present

## 2015-09-27 DIAGNOSIS — Z713 Dietary counseling and surveillance: Secondary | ICD-10-CM | POA: Insufficient documentation

## 2015-09-27 DIAGNOSIS — G9619 Other disorders of meninges, not elsewhere classified: Secondary | ICD-10-CM | POA: Diagnosis not present

## 2015-09-27 DIAGNOSIS — M5441 Lumbago with sciatica, right side: Secondary | ICD-10-CM

## 2015-09-27 DIAGNOSIS — F1190 Opioid use, unspecified, uncomplicated: Secondary | ICD-10-CM | POA: Diagnosis not present

## 2015-09-27 DIAGNOSIS — M4726 Other spondylosis with radiculopathy, lumbar region: Secondary | ICD-10-CM

## 2015-09-27 DIAGNOSIS — M48061 Spinal stenosis, lumbar region without neurogenic claudication: Secondary | ICD-10-CM | POA: Insufficient documentation

## 2015-09-27 DIAGNOSIS — M961 Postlaminectomy syndrome, not elsewhere classified: Secondary | ICD-10-CM | POA: Insufficient documentation

## 2015-09-27 DIAGNOSIS — M545 Low back pain, unspecified: Secondary | ICD-10-CM

## 2015-09-27 DIAGNOSIS — Z87891 Personal history of nicotine dependence: Secondary | ICD-10-CM | POA: Diagnosis not present

## 2015-09-27 DIAGNOSIS — K219 Gastro-esophageal reflux disease without esophagitis: Secondary | ICD-10-CM | POA: Diagnosis not present

## 2015-09-27 DIAGNOSIS — E8881 Metabolic syndrome: Secondary | ICD-10-CM | POA: Insufficient documentation

## 2015-09-27 DIAGNOSIS — F1120 Opioid dependence, uncomplicated: Secondary | ICD-10-CM | POA: Diagnosis not present

## 2015-09-27 DIAGNOSIS — M879 Osteonecrosis, unspecified: Secondary | ICD-10-CM | POA: Insufficient documentation

## 2015-09-27 DIAGNOSIS — M47816 Spondylosis without myelopathy or radiculopathy, lumbar region: Secondary | ICD-10-CM | POA: Insufficient documentation

## 2015-09-27 DIAGNOSIS — M5442 Lumbago with sciatica, left side: Secondary | ICD-10-CM

## 2015-09-27 DIAGNOSIS — Z86010 Personal history of colonic polyps: Secondary | ICD-10-CM | POA: Insufficient documentation

## 2015-09-27 DIAGNOSIS — I10 Essential (primary) hypertension: Secondary | ICD-10-CM | POA: Insufficient documentation

## 2015-09-27 DIAGNOSIS — E669 Obesity, unspecified: Secondary | ICD-10-CM | POA: Insufficient documentation

## 2015-09-27 DIAGNOSIS — Z79891 Long term (current) use of opiate analgesic: Secondary | ICD-10-CM | POA: Insufficient documentation

## 2015-09-27 DIAGNOSIS — F419 Anxiety disorder, unspecified: Secondary | ICD-10-CM | POA: Diagnosis not present

## 2015-09-27 DIAGNOSIS — G894 Chronic pain syndrome: Secondary | ICD-10-CM | POA: Diagnosis not present

## 2015-09-27 DIAGNOSIS — M47896 Other spondylosis, lumbar region: Secondary | ICD-10-CM | POA: Insufficient documentation

## 2015-09-27 DIAGNOSIS — Z23 Encounter for immunization: Secondary | ICD-10-CM | POA: Insufficient documentation

## 2015-09-27 DIAGNOSIS — M7989 Other specified soft tissue disorders: Secondary | ICD-10-CM | POA: Insufficient documentation

## 2015-09-27 DIAGNOSIS — IMO0001 Reserved for inherently not codable concepts without codable children: Secondary | ICD-10-CM | POA: Insufficient documentation

## 2015-09-27 DIAGNOSIS — G96198 Other disorders of meninges, not elsewhere classified: Secondary | ICD-10-CM | POA: Insufficient documentation

## 2015-09-27 DIAGNOSIS — M539 Dorsopathy, unspecified: Secondary | ICD-10-CM

## 2015-09-27 DIAGNOSIS — E119 Type 2 diabetes mellitus without complications: Secondary | ICD-10-CM | POA: Insufficient documentation

## 2015-09-27 DIAGNOSIS — E1169 Type 2 diabetes mellitus with other specified complication: Secondary | ICD-10-CM | POA: Insufficient documentation

## 2015-09-27 DIAGNOSIS — M25562 Pain in left knee: Secondary | ICD-10-CM

## 2015-09-27 DIAGNOSIS — Z7189 Other specified counseling: Secondary | ICD-10-CM

## 2015-09-27 DIAGNOSIS — Z029 Encounter for administrative examinations, unspecified: Secondary | ICD-10-CM | POA: Insufficient documentation

## 2015-09-27 HISTORY — DX: Other chronic pain: G89.29

## 2015-09-27 LAB — MAGNESIUM: Magnesium: 1.3 mg/dL — ABNORMAL LOW (ref 1.7–2.4)

## 2015-09-27 LAB — SEDIMENTATION RATE: Sed Rate: 4 mm/hr (ref 0–20)

## 2015-09-27 LAB — C-REACTIVE PROTEIN: CRP: <0.5 mg/dL (ref <1.0)

## 2015-09-27 MED ORDER — MAGNESIUM OXIDE -MG SUPPLEMENT 500 MG PO CAPS: 1.0000 | ORAL_CAPSULE | Freq: Every day | ORAL | Status: DC

## 2015-09-27 MED ORDER — MORPHINE SULFATE ER 15 MG PO TBCR: 15.0000 mg | EXTENDED_RELEASE_TABLET | Freq: Two times a day (BID) | ORAL | Status: DC

## 2015-09-27 NOTE — Progress Notes (Signed)
Safety precautions to be maintained throughout the outpatient stay will include: orient to surroundings, keep bed in low position, maintain call bell within reach at all times, provide assistance with transfer out of bed and ambulation. MS contin pill count #0

## 2015-09-27 NOTE — Progress Notes (Signed)
Patient's Name: Joel Bryant MRN: [id number] DOB: 04-26-1961 DOS: 09/27/2015  Primary Reason(s) for Visit: Encounter for Medication Management CC: Back Pain and Hip Pain   HPI:   Joel Bryant is a 54 y.o. year old, male patient, who returns today as an established patient. He has Anxiety disorder; Lumbar radiculopathy; Hypertriglyceridemia; BP (high blood pressure); Dyslipidemia; Diabetes mellitus type 2, uncontrolled, without complications ([place]); Brash; Insomnia secondary to anxiety; Continuous opioid dependence ([place]); Retinitis pigmentosa; Edema leg; Gout; Obstructive apnea; Diabetes mellitus, type 2 ([place]); Can't get food down; H/O adenomatous polyp of colon; Leg swelling; Dysmetabolic syndrome; Adiposity; Pneumococcal vaccination given; Post laminectomy syndrome; Dietary counseling and surveillance; Long term current use of opiate analgesic; Long term prescription opiate use; Opiate use; Encounter for therapeutic drug level monitoring; Encounter for chronic pain management; Lumbar spondylosis; Failed back surgical syndrome; Epidural fibrosis; Chronic pain; Chronic low back pain; and Hypomagnesemia on his problem list.. His primarily concern today is the Back Pain and Hip Pain     The patient returns today for follow-up evaluation and medication management. Today we talked about his low magnesium levels as well as his elevated liver enzymes, which on follow-up have improved. The patient was counseled on these two.  Today's Pain Score: 4 , clinically he looks like a 1/10. Reported level of pain is incompatible with clinical obrservations. This may be secondary to a possible lack of understanding on how the pain scale works. Pain Type: Chronic pain Pain Location: Back (hips) Pain Orientation: Lower Pain Descriptors / Indicators: Sharp Pain Frequency: Constant  Date of Last Visit: 06/24/15 Service Provided on Last Visit: Med Refill  Pharmacotherapy Review:   Side-effects or Adverse reactions:  None reported. Effectiveness: Described as relatively effective, allowing for increase in activities of daily living (ADL). Onset of action: Within expected pharmacological parameters. Duration of action: Within normal limits for medication. Peak effect: Timing and results are as within normal expected parameters. [place] PMP: Compliant with practice rules and regulations. UDS Results:  Lab Results  Component Value Date   THCU NEGATIVE 11/18/2011   PCPSCRNUR NEGATIVE 11/18/2011   MDMA NEGATIVE 11/18/2011   AMPHETMU NEGATIVE 11/18/2011   METHADONE NEGATIVE 11/18/2011   ETOH 245 11/18/2011    UDS Interpretation: Patient appears to be compliant with practice rules and regulations. Medication Assessment Form: Reviewed. Patient indicates being compliant with therapy Treatment compliance: Compliant. Substance Use Disorder (SUD) Risk Level: Moderate Pharmacologic Plan: Continue therapy as is. Because he has been compliant, we will extend the period between visits, assuming that he stays like this.  Last Available Lab Work: Office Visit on 09/13/2015  Component Date Value Ref Range Status  . Hemoglobin A1C 09/13/2015 8.6   Final  . POC Glucose 09/13/2015 222* 70 - 99 mg/dl Final  . Cholesterol, Total 09/13/2015 136  100 - 199 mg/dL Final  . Triglycerides 09/13/2015 251* 0 - 149 mg/dL Final  . HDL 09/13/2015 41  >39 mg/dL Final  . VLDL Cholesterol Cal 09/13/2015 50* 5 - 40 mg/dL Final  . LDL Calculated 09/13/2015 45  0 - 99 mg/dL Final  . Chol/HDL Ratio 09/13/2015 3.3  0.0 - 5.0 ratio units Final  . Glucose 09/13/2015 216* 65 - 99 mg/dL Final  . BUN 09/13/2015 8  6 - 24 mg/dL Final  . Creatinine, Ser 09/13/2015 0.62* 0.76 - 1.27 mg/dL Final  . GFR calc non Af Amer 09/13/2015 112  >59 mL/min/1.73 Final  . GFR calc Af Amer 09/13/2015 129  >59 mL/min/1.73 Final  .  BUN/Creatinine Ratio 09/13/2015 13  9 - 20 Final  . Sodium 09/13/2015 138  136 - 144 mmol/L Final  . Potassium 09/13/2015 5.0  3.5  - 5.2 mmol/L Final  . Chloride 09/13/2015 92* 97 - 106 mmol/L Final  . CO2 09/13/2015 25  18 - 29 mmol/L Final  . Calcium 09/13/2015 9.5  8.7 - 10.2 mg/dL Final  . Total Protein 09/13/2015 7.1  6.0 - 8.5 g/dL Final  . Albumin 09/13/2015 4.9  3.5 - 5.5 g/dL Final  . Globulin, Total 09/13/2015 2.2  1.5 - 4.5 g/dL Final  . Albumin/Globulin Ratio 09/13/2015 2.2  1.1 - 2.5 Final  . Bilirubin Total 09/13/2015 1.1  0.0 - 1.2 mg/dL Final  . Alkaline Phosphatase 09/13/2015 69  39 - 117 IU/L Final  . AST 09/13/2015 46* 0 - 40 IU/L Final  . ALT 09/13/2015 31  0 - 44 IU/L Final    Allergies: Joel Bryant has No Known Allergies.  Meds: The patient has a current medication list which includes the following prescription(s): atenolol, atorvastatin, clonazepam, clonazepam, esomeprazole, furosemide, lisinopril, metformin, morphine, omega-3 acid ethyl esters, sitagliptin, tizanidine, magnesium oxide, morphine, and morphine. Requested Prescriptions   Signed Prescriptions Disp Refills  . Magnesium Oxide 500 MG CAPS 100 capsule PRN    Sig: Take 1 capsule (500 mg total) by mouth daily.  Marland Kitchen morphine (MS CONTIN) 15 MG 12 hr tablet 60 tablet 0    Sig: Take 1 tablet (15 mg total) by mouth every 12 (twelve) hours.  Marland Kitchen morphine (MS CONTIN) 15 MG 12 hr tablet 60 tablet 0    Sig: Take 1 tablet (15 mg total) by mouth every 12 (twelve) hours.  Marland Kitchen morphine (MS CONTIN) 15 MG 12 hr tablet 60 tablet 0    Sig: Take 1 tablet (15 mg total) by mouth every 12 (twelve) hours.    ROS: Constitutional: Afebrile, no chills, well hydrated and well nourished Gastrointestinal: negative Musculoskeletal:negative Neurological: negative Behavioral/Psych: negative  PFSH: Medical:  Joel Bryant  has a past medical history of Diabetes mellitus without complication ([place]); Anxiety; Hyperlipidemia; Hypertension; GERD (gastroesophageal reflux disease); Hypomagnesemia; Hyperlipidemia; and Chronic LBP (09/13/2015). Family: family history  includes Cancer in his maternal grandfather; Colon polyps in his mother; Diabetes in his paternal grandmother; Retinitis pigmentosa in his maternal grandmother and mother. Surgical:  has past surgical history that includes Laminectomy (2001); Cataract extraction (1992); and Wrist surgery (Right). Tobacco:  reports that he has quit smoking. His smoking use included Cigarettes. He has never used smokeless tobacco. Alcohol:  reports that he drinks alcohol. Drug:  reports that he does not use illicit drugs.  Physical Exam: Vitals:  Today's Vitals   09/27/15 0758 09/27/15 0801  BP: 132/79   Pulse: 58   Temp: 97.9 F (36.6 C)   Resp: 18   Height: 5\' 9"  (1.753 m)   Weight: 230 lb (104.327 kg)   SpO2: 99%   PainSc: 4  4   PainLoc: Back   Calculated BMI: Body mass index is 33.95 kg/(m^2). General appearance: alert, cooperative, appears stated age, no distress and mildly obese Eyes: conjunctivae/corneas clear. PERRL, EOM's intact. Fundi benign. Lungs: No evidence respiratory distress, no audible rales or ronchi and no use of accessory muscles of respiration Neck: no adenopathy, no carotid bruit, no JVD, supple, symmetrical, trachea midline and thyroid not enlarged, symmetric, no tenderness/mass/nodules Back: symmetric, no curvature. ROM normal. No CVA tenderness. Extremities: extremities normal, atraumatic, no cyanosis or edema Pulses: 2+ and symmetric Skin: Skin color, texture,  turgor normal. No rashes or lesions Neurologic: Grossly normal    Assessment: Encounter Diagnosis:  Primary Diagnosis: Chronic pain [G89.29]  Plan: Rielly was seen today for back pain and hip pain.  Diagnoses and all orders for this visit:  Chronic pain -     Cancel: COMPLETE METABOLIC PANEL WITH GFR; Future -     C-reactive protein; Future -     Magnesium; Future -     Sedimentation rate; Future -     Vitamin D2,D3 Panel; Future -     morphine (MS CONTIN) 15 MG 12 hr tablet; Take 1 tablet (15 mg total)  by mouth every 12 (twelve) hours. -     morphine (MS CONTIN) 15 MG 12 hr tablet; Take 1 tablet (15 mg total) by mouth every 12 (twelve) hours. -     morphine (MS CONTIN) 15 MG 12 hr tablet; Take 1 tablet (15 mg total) by mouth every 12 (twelve) hours.  Long term current use of opiate analgesic -     Drugs of abuse screen w/o alc, rtn urine-sln -     Drugs of abuse screen w/o alc, rtn urine-sln; Standing  Long term prescription opiate use  Opiate use  Encounter for therapeutic drug level monitoring  Encounter for chronic pain management  Osteoarthritis of spine with radiculopathy, lumbar region  Failed back surgical syndrome  Epidural fibrosis  Chronic low back pain  Hypomagnesemia -     Magnesium; Future -     Magnesium Oxide 500 MG CAPS; Take 1 capsule (500 mg total) by mouth daily.     Patient Instructions  Labwork to be drawn as discussed   Medications discontinued today:  Medications Discontinued During This Encounter  Medication Reason  . morphine (MS CONTIN) 15 MG 12 hr tablet Reorder   Medications administered today:  Mr. Whicker had no medications administered during this visit.  Primary Care Physician: Keith Rake, MD Location: [facility] Note by: Gaege Sangalang A. Dossie Arbour, M.D, DABA, DABAPM, DABPM, DABIPP, FIPP

## 2015-09-27 NOTE — Patient Instructions (Signed)
Labwork to be drawn as discussed

## 2015-09-30 LAB — 25-HYDROXY VITAMIN D LCMS D2+D3
25-Hydroxy, Vitamin D-2: <1 ng/mL
25-Hydroxy, Vitamin D-3: 17 ng/mL
25-Hydroxy, Vitamin D: 17 ng/mL — ABNORMAL LOW

## 2015-10-01 LAB — TOXASSURE SELECT 13 (MW), URINE: PDF: 0

## 2015-10-05 NOTE — Progress Notes (Signed)
Quick Note:  Normal levels of Vitamin D for our Lab are between 30 and 100 ng/mL. The results of this test indicate that this patient has low levels of Vitamin D, compatible with a deficiency (<20 ng/ml), and/or insufficiency (20-30 ng/ml). Common causes include: dietary insufficiency; inadequate sun exposure; inability to absorb vitamin D from the intestines; or inability to process it due to kidney or liver disease. Associated complications may include hypocalcemia, hypophosphatemia, and reduced bone density. In addition, it is associated with fatigue, weakness, bone pain, joint pain, and muscle pain. The patient may benefit from taking over-the-counter Vitamin D3 supplements. I recommend 2,000 IU once a day, preferably with a Calcium supplement. ______ 

## 2015-10-05 NOTE — Progress Notes (Signed)
Quick Note:  The results of this test were unexpected. No therapeutic levels of the prescribed medication were found. Results will need to be reviewed with patient. Possible causes include: PRN use; increased intake with early depletion of medication; or opioid deveation. We will be paying attention to pill counts to assist with diferential. ______ 

## 2015-12-13 ENCOUNTER — Encounter: Payer: Self-pay | Admitting: Family Medicine

## 2015-12-13 ENCOUNTER — Ambulatory Visit (INDEPENDENT_AMBULATORY_CARE_PROVIDER_SITE_OTHER): Payer: Medicare Other | Admitting: Family Medicine

## 2015-12-13 DIAGNOSIS — F411 Generalized anxiety disorder: Secondary | ICD-10-CM

## 2015-12-13 DIAGNOSIS — M5416 Radiculopathy, lumbar region: Secondary | ICD-10-CM | POA: Diagnosis not present

## 2015-12-13 DIAGNOSIS — E785 Hyperlipidemia, unspecified: Secondary | ICD-10-CM | POA: Diagnosis not present

## 2015-12-13 DIAGNOSIS — E781 Pure hyperglyceridemia: Secondary | ICD-10-CM | POA: Diagnosis not present

## 2015-12-13 DIAGNOSIS — I10 Essential (primary) hypertension: Secondary | ICD-10-CM

## 2015-12-13 DIAGNOSIS — K219 Gastro-esophageal reflux disease without esophagitis: Secondary | ICD-10-CM

## 2015-12-13 DIAGNOSIS — G4701 Insomnia due to medical condition: Secondary | ICD-10-CM | POA: Diagnosis not present

## 2015-12-13 DIAGNOSIS — IMO0001 Reserved for inherently not codable concepts without codable children: Secondary | ICD-10-CM

## 2015-12-13 DIAGNOSIS — G8929 Other chronic pain: Secondary | ICD-10-CM

## 2015-12-13 DIAGNOSIS — E1165 Type 2 diabetes mellitus with hyperglycemia: Secondary | ICD-10-CM | POA: Diagnosis not present

## 2015-12-13 MED ORDER — ATORVASTATIN CALCIUM 20 MG PO TABS: 20.0000 mg | ORAL_TABLET | Freq: Every day | ORAL | Status: DC

## 2015-12-13 MED ORDER — LISINOPRIL 2.5 MG PO TABS: 2.5000 mg | ORAL_TABLET | Freq: Every day | ORAL | Status: DC

## 2015-12-13 MED ORDER — CLONAZEPAM 0.5 MG PO TABS: 0.5000 mg | ORAL_TABLET | Freq: Every evening | ORAL | Status: DC | PRN

## 2015-12-13 MED ORDER — SITAGLIPTIN PHOSPHATE 50 MG PO TABS: 50.0000 mg | ORAL_TABLET | Freq: Every day | ORAL | Status: DC

## 2015-12-13 MED ORDER — TIZANIDINE HCL 4 MG PO TABS: 4.0000 mg | ORAL_TABLET | Freq: Every day | ORAL | Status: DC

## 2015-12-13 MED ORDER — METFORMIN HCL 1000 MG PO TABS: 1000.0000 mg | ORAL_TABLET | Freq: Two times a day (BID) | ORAL | Status: DC

## 2015-12-13 MED ORDER — ATENOLOL 100 MG PO TABS: 100.0000 mg | ORAL_TABLET | Freq: Every day | ORAL | Status: DC

## 2015-12-13 MED ORDER — OMEGA-3-ACID ETHYL ESTERS 1 G PO CAPS: 2.0000 g | ORAL_CAPSULE | Freq: Two times a day (BID) | ORAL | Status: DC

## 2015-12-13 MED ORDER — ESOMEPRAZOLE MAGNESIUM 40 MG PO CPDR: 40.0000 mg | DELAYED_RELEASE_CAPSULE | Freq: Every day | ORAL | Status: DC

## 2015-12-13 MED ORDER — CLONAZEPAM 1 MG PO TABS: 1.0000 mg | ORAL_TABLET | Freq: Two times a day (BID) | ORAL | Status: DC | PRN

## 2015-12-13 NOTE — Progress Notes (Signed)
Name: Joel Bryant   MRN: [id number]    DOB: August 09, 1961   Date:12/13/2015       Progress Note  Subjective  Chief Complaint  Chief Complaint  Patient presents with  . Follow-up    3 mo  . Diabetes    Diabetes He presents for his follow-up diabetic visit. He has type 2 diabetes mellitus. His disease course has been improving. There are no hypoglycemic associated symptoms. Pertinent negatives for hypoglycemia include no headaches or nervousness/anxiousness. Pertinent negatives for diabetes include no blurred vision, no chest pain, no fatigue, no foot paresthesias, no polydipsia and no polyuria. Pertinent negatives for diabetic complications include no CVA. Current diabetic treatment includes oral agent (dual therapy). His weight is stable. He is following a diabetic diet. He monitors blood glucose at home 1-2 x per day. His breakfast blood glucose range is generally 110-130 mg/dl. An ACE inhibitor/angiotensin II receptor blocker is being taken.  Hyperlipidemia This is a chronic problem. The problem is uncontrolled. Recent lipid tests were reviewed and are high (Elevated Triglycerides, normal TC and LDL.). Exacerbating diseases include diabetes and obesity. Pertinent negatives include no chest pain or shortness of breath. Current antihyperlipidemic treatment includes statins (Lovaza plus Atorvastatin.). Risk factors for coronary artery disease include diabetes mellitus, dyslipidemia, obesity, male sex and stress.  Hypertension This is a chronic problem. The problem is controlled. Pertinent negatives include no anxiety, blurred vision, chest pain, headaches, palpitations or shortness of breath. Past treatments include ACE inhibitors and beta blockers. There is no history of kidney disease, CAD/MI or CVA.  Anxiety Presents for follow-up visit. Patient reports no chest pain, depressed mood, excessive worry, insomnia, muscle tension, nausea, nervous/anxious behavior, palpitations, panic or shortness of  breath. The quality of sleep is good.   Past treatments include benzodiazephines.  Back Pain This is a chronic problem. The problem occurs daily. The pain is present in the lumbar spine. The pain is at a severity of 3/10. The symptoms are aggravated by position. Pertinent negatives include no abdominal pain, chest pain or headaches. He has tried muscle relaxant (Tizanidine 4mg  daily. Also seen by pain Management clinic.) for the symptoms.  Gastroesophageal Reflux He reports no abdominal pain, no belching, no chest pain, no dysphagia or no nausea. This is a chronic problem. The problem has been unchanged. Pertinent negatives include no fatigue. He has tried a PPI for the symptoms.     Past Medical History  Diagnosis Date  . Diabetes mellitus without complication ([place])   . Anxiety   . Hyperlipidemia   . Hypertension   . GERD (gastroesophageal reflux disease)   . Hypomagnesemia   . Hyperlipidemia   . Chronic LBP 09/13/2015  . Chronic left hip pain 09/27/2015    Past Surgical History  Procedure Laterality Date  . Laminectomy  2001    lumbar  . Cataract extraction  1992    Insert Prosthetic Lens  . Wrist surgery Right     x2    Family History  Problem Relation Age of Onset  . Colon polyps Mother   . Retinitis pigmentosa Mother   . Retinitis pigmentosa Maternal Grandmother   . Cancer Maternal Grandfather   . Diabetes Paternal Grandmother     Social History   Social History  . Marital Status: Divorced    Spouse Name: N/A  . Number of Children: N/A  . Years of Education: N/A   Occupational History  . Not on file.   Social History Main Topics  . Smoking  status: Former Smoker    Types: Cigarettes  . Smokeless tobacco: Never Used     Comment: Quit smoking 4 years now  . Alcohol Use: 0.0 oz/week    0 Standard drinks or equivalent per week  . Drug Use: No  . Sexual Activity: Not on file   Other Topics Concern  . Not on file   Social History Narrative      Current outpatient prescriptions:  .  atenolol (TENORMIN) 100 MG tablet, Take 1 tablet (100 mg total) by mouth daily., Disp: 90 tablet, Rfl: 1 .  atorvastatin (LIPITOR) 20 MG tablet, Take 1 tablet (20 mg total) by mouth daily at 6 PM., Disp: 90 tablet, Rfl: 1 .  clonazePAM (KLONOPIN) 0.5 MG tablet, Take 1 tablet (0.5 mg total) by mouth at bedtime as needed for anxiety., Disp: 90 tablet, Rfl: 0 .  clonazePAM (KLONOPIN) 1 MG tablet, Take 1 tablet (1 mg total) by mouth 2 (two) times daily as needed., Disp: 180 tablet, Rfl: 0 .  esomeprazole (NEXIUM) 40 MG capsule, Take 1 capsule (40 mg total) by mouth daily., Disp: 90 capsule, Rfl: 1 .  furosemide (LASIX) 40 MG tablet, Take 1 tablet (40 mg total) by mouth daily., Disp: 30 tablet, Rfl: 2 .  lisinopril (PRINIVIL,ZESTRIL) 2.5 MG tablet, Take 1 tablet (2.5 mg total) by mouth daily., Disp: 90 tablet, Rfl: 1 .  Magnesium Oxide 500 MG CAPS, Take 1 capsule (500 mg total) by mouth daily., Disp: 100 capsule, Rfl: PRN .  metFORMIN (GLUCOPHAGE) 1000 MG tablet, Take 1 tablet (1,000 mg total) by mouth 2 (two) times daily., Disp: 180 tablet, Rfl: 1 .  morphine (MS CONTIN) 15 MG 12 hr tablet, Take 1 tablet (15 mg total) by mouth every 12 (twelve) hours., Disp: 60 tablet, Rfl: 0 .  morphine (MS CONTIN) 15 MG 12 hr tablet, Take 1 tablet (15 mg total) by mouth every 12 (twelve) hours., Disp: 60 tablet, Rfl: 0 .  morphine (MS CONTIN) 15 MG 12 hr tablet, Take 1 tablet (15 mg total) by mouth every 12 (twelve) hours., Disp: 60 tablet, Rfl: 0 .  omega-3 acid ethyl esters (LOVAZA) 1 G capsule, Take 2 capsules (2 g total) by mouth 2 (two) times daily., Disp: 360 capsule, Rfl: 1 .  sitaGLIPtin (JANUVIA) 50 MG tablet, Take 1 tablet (50 mg total) by mouth daily., Disp: 90 tablet, Rfl: 0 .  tiZANidine (ZANAFLEX) 4 MG tablet, Take 1 tablet (4 mg total) by mouth daily., Disp: 90 tablet, Rfl: 1  No Known Allergies   Review of Systems  Constitutional: Negative for fatigue.   Eyes: Negative for blurred vision and double vision.  Respiratory: Negative for shortness of breath.   Cardiovascular: Negative for chest pain and palpitations.  Gastrointestinal: Negative for dysphagia, nausea, vomiting, abdominal pain and blood in stool (has hemorrhoids that bleed occasionally.).  Musculoskeletal: Positive for back pain.  Neurological: Negative for headaches.  Endo/Heme/Allergies: Negative for polydipsia.  Psychiatric/Behavioral: Negative for depression. The patient is not nervous/anxious and does not have insomnia.      Objective  Filed Vitals:   12/13/15 1353  BP: 133/78  Pulse: 65  Temp: 98.5 F (36.9 C)  TempSrc: Oral  Resp: 17  Height: 5\' 9"  (1.753 m)  Weight: 242 lb 3.2 oz (109.861 kg)  SpO2: 96%    Physical Exam  Constitutional: He is oriented to person, place, and time and well-developed, well-nourished, and in no distress.  HENT:  Head: Normocephalic and atraumatic.  Eyes: Pupils  are equal, round, and reactive to light.  Cardiovascular: Normal rate, regular rhythm and normal heart sounds.   No murmur heard. Pulmonary/Chest: Effort normal and breath sounds normal. He has no wheezes.  Abdominal: Soft. Bowel sounds are normal. There is no tenderness.  Musculoskeletal: He exhibits tenderness. He exhibits no edema.  Neurological: He is alert and oriented to person, place, and time.  Skin: Skin is warm and dry.  Psychiatric: Mood, memory, affect and judgment normal.  Nursing note and vitals reviewed.    Assessment & Plan  1. Dyslipidemia  - atorvastatin (LIPITOR) 20 MG tablet; Take 1 tablet (20 mg total) by mouth daily at 6 PM.  Dispense: 90 tablet; Refill: 1 - Lipid Profile - Comprehensive Metabolic Panel (CMET)  2. Essential hypertension  - atenolol (TENORMIN) 100 MG tablet; Take 1 tablet (100 mg total) by mouth daily.  Dispense: 90 tablet; Refill: 1  3. Hypertriglyceridemia  - omega-3 acid ethyl esters (LOVAZA) 1 g capsule; Take 2  capsules (2 g total) by mouth 2 (two) times daily.  Dispense: 360 capsule; Refill: 1 - Lipid Profile  4. Insomnia secondary to chronic pain  - clonazePAM (KLONOPIN) 0.5 MG tablet; Take 1 tablet (0.5 mg total) by mouth at bedtime as needed for anxiety.  Dispense: 90 tablet; Refill: 0  5. Generalized anxiety disorder  - clonazePAM (KLONOPIN) 1 MG tablet; Take 1 tablet (1 mg total) by mouth 2 (two) times daily as needed.  Dispense: 180 tablet; Refill: 0  6. Lumbar radiculopathy  - tiZANidine (ZANAFLEX) 4 MG tablet; Take 1 tablet (4 mg total) by mouth daily.  Dispense: 90 tablet; Refill: 1  7. Uncontrolled type 2 diabetes mellitus without complication, without long-term current use of insulin ([facility])  - lisinopril (PRINIVIL,ZESTRIL) 2.5 MG tablet; Take 1 tablet (2.5 mg total) by mouth daily.  Dispense: 90 tablet; Refill: 1 - metFORMIN (GLUCOPHAGE) 1000 MG tablet; Take 1 tablet (1,000 mg total) by mouth 2 (two) times daily.  Dispense: 180 tablet; Refill: 1 - sitaGLIPtin (JANUVIA) 50 MG tablet; Take 1 tablet (50 mg total) by mouth daily.  Dispense: 90 tablet; Refill: 0 - POCT HgB A1C - POCT Glucose (CBG)  8. Gastroesophageal reflux disease, esophagitis presence not specified  - esomeprazole (NEXIUM) 40 MG capsule; Take 1 capsule (40 mg total) by mouth daily.  Dispense: 90 capsule; Refill: 1    Jossalyn Forgione [facility] 12/13/2015 2:18 PM

## 2015-12-20 ENCOUNTER — Encounter: Payer: Self-pay | Admitting: Pain Medicine

## 2015-12-27 ENCOUNTER — Encounter: Payer: Self-pay | Admitting: Pain Medicine

## 2015-12-27 ENCOUNTER — Ambulatory Visit: Payer: Medicare Other | Attending: Pain Medicine | Admitting: Pain Medicine

## 2015-12-27 VITALS — BP 150/93 | HR 63 | Temp 98.1°F | Resp 18 | Ht 69.0 in | Wt 235.0 lb

## 2015-12-27 DIAGNOSIS — G8929 Other chronic pain: Secondary | ICD-10-CM | POA: Insufficient documentation

## 2015-12-27 DIAGNOSIS — M87851 Other osteonecrosis, right femur: Secondary | ICD-10-CM | POA: Insufficient documentation

## 2015-12-27 DIAGNOSIS — M25562 Pain in left knee: Secondary | ICD-10-CM | POA: Insufficient documentation

## 2015-12-27 DIAGNOSIS — E669 Obesity, unspecified: Secondary | ICD-10-CM | POA: Insufficient documentation

## 2015-12-27 DIAGNOSIS — Z9889 Other specified postprocedural states: Secondary | ICD-10-CM | POA: Insufficient documentation

## 2015-12-27 DIAGNOSIS — M545 Low back pain, unspecified: Secondary | ICD-10-CM

## 2015-12-27 DIAGNOSIS — M879 Osteonecrosis, unspecified: Secondary | ICD-10-CM

## 2015-12-27 DIAGNOSIS — G4709 Other insomnia: Secondary | ICD-10-CM | POA: Diagnosis not present

## 2015-12-27 DIAGNOSIS — M4806 Spinal stenosis, lumbar region: Secondary | ICD-10-CM | POA: Insufficient documentation

## 2015-12-27 DIAGNOSIS — E8881 Metabolic syndrome: Secondary | ICD-10-CM | POA: Insufficient documentation

## 2015-12-27 DIAGNOSIS — M25559 Pain in unspecified hip: Secondary | ICD-10-CM

## 2015-12-27 DIAGNOSIS — M109 Gout, unspecified: Secondary | ICD-10-CM | POA: Insufficient documentation

## 2015-12-27 DIAGNOSIS — E1165 Type 2 diabetes mellitus with hyperglycemia: Secondary | ICD-10-CM | POA: Diagnosis not present

## 2015-12-27 DIAGNOSIS — K219 Gastro-esophageal reflux disease without esophagitis: Secondary | ICD-10-CM | POA: Diagnosis not present

## 2015-12-27 DIAGNOSIS — I10 Essential (primary) hypertension: Secondary | ICD-10-CM | POA: Diagnosis not present

## 2015-12-27 DIAGNOSIS — Z5181 Encounter for therapeutic drug level monitoring: Secondary | ICD-10-CM

## 2015-12-27 DIAGNOSIS — E781 Pure hyperglyceridemia: Secondary | ICD-10-CM | POA: Insufficient documentation

## 2015-12-27 DIAGNOSIS — R12 Heartburn: Secondary | ICD-10-CM | POA: Diagnosis not present

## 2015-12-27 DIAGNOSIS — E785 Hyperlipidemia, unspecified: Secondary | ICD-10-CM | POA: Insufficient documentation

## 2015-12-27 DIAGNOSIS — M47896 Other spondylosis, lumbar region: Secondary | ICD-10-CM

## 2015-12-27 DIAGNOSIS — M549 Dorsalgia, unspecified: Secondary | ICD-10-CM | POA: Diagnosis present

## 2015-12-27 DIAGNOSIS — M87852 Other osteonecrosis, left femur: Secondary | ICD-10-CM | POA: Insufficient documentation

## 2015-12-27 DIAGNOSIS — M47816 Spondylosis without myelopathy or radiculopathy, lumbar region: Secondary | ICD-10-CM | POA: Diagnosis not present

## 2015-12-27 DIAGNOSIS — Z79891 Long term (current) use of opiate analgesic: Secondary | ICD-10-CM | POA: Diagnosis not present

## 2015-12-27 DIAGNOSIS — F419 Anxiety disorder, unspecified: Secondary | ICD-10-CM | POA: Insufficient documentation

## 2015-12-27 DIAGNOSIS — Z87891 Personal history of nicotine dependence: Secondary | ICD-10-CM | POA: Diagnosis not present

## 2015-12-27 DIAGNOSIS — G4733 Obstructive sleep apnea (adult) (pediatric): Secondary | ICD-10-CM | POA: Insufficient documentation

## 2015-12-27 MED ORDER — MORPHINE SULFATE ER 15 MG PO TBCR: 15.0000 mg | EXTENDED_RELEASE_TABLET | Freq: Two times a day (BID) | ORAL | Status: DC

## 2015-12-27 NOTE — Progress Notes (Signed)
Patient's Name: Joel Bryant MRN: [id number] DOB: 07-Feb-1961 DOS: 12/27/2015  Primary Reason(s) for Visit: Encounter for Medication Management CC: Back Pain   HPI  Joel Bryant is a 55 y.o. year old, male patient, who returns today as an established patient. 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); Dietary counseling and surveillance; Long term current use of opiate analgesic; Long term prescription opiate use; Opiate use (30 MME/Day); Encounter for therapeutic drug level monitoring; Encounter for chronic pain management; Lumbar spondylosis; Failed back surgical syndrome; Epidural fibrosis; Chronic pain; Chronic low back pain (Location of Primary Source of Pain) (Bilateral) (L>R); Hypomagnesemia; Encounter for long-term (current) use of other high-risk medications; Encounter for opiate analgesic use agreement; Opioid use agreement exists; Chronic left knee pain; Lumbar spinal stenosis (L4-5); Lumbar foraminal stenosis (Bilateral L3-4 and L4-5); Lumbar facet hypertrophy (Bilateral, Multilevel); Lumbar facet syndrome (Bilateral); 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 His primarily concern today is the Back Pain   The patient returns to the clinic today for pharmacological management of his chronic pain. He indicates his primary pain to be in the lower back and left being worse than  the right. His secondary worst pain is in the hips were he has known aseptic necrosis of the hips.  Pain Assessment: Self-Reported Pain Score: 5  Reported level is compatible with observation Pain Type: Chronic pain Pain Location: Back Pain Orientation: Lower Pain Descriptors / Indicators: Aching, Dull Pain Frequency: Constant  Date of Last Visit: 09/27/15 Service Provided on Last Visit: Med Refill  Controlled Substance Pharmacotherapy Assessment  Analgesic: MS Contin 15 mg every 12 hours (30 mg/day) MME/day: 30 mg/day Pharmacokinetics: Onset of action (Liberation/Absorption): Within expected pharmacological parameters. (30 minutes) Time to Peak effect (Distribution): Timing and results are as within normal expected parameters. (4-6 hours) Duration of action (Metabolism/Excretion): Within normal limits for medication. (8-12 hours) Pharmacodynamics: Analgesic Effect: 100% Activity Facilitation: Medication(s) allow patient to sit, stand, walk, and do the basic ADLs Perceived Effectiveness: Described as relatively effective, allowing for increase in activities of daily living (ADL) Side-effects or Adverse reactions: None reported Monitoring: [place] PMP: Compliant with practice rules and regulations UDS Results/interpretation: Last UDS done on 09/27/2015 came back with unexpected results since it had no detectable morphine levels. The patient had failed his last prescription prior to this UDS on 08/27/2015. October has 31 days and the maximum he should've been without medication pain 24 hours. He failed his last prescription on 11/26/2015 and according to my calculations she should be running out on 12/26/2015, yesterday. Today we had a long conversation with regards to this and we will be keeping an eye on the results of today's UDS. We'll make every attempt to have him come in before he runs out. Medication Assessment Form: Reviewed. Patient indicates being compliant with therapy Treatment  compliance: Compliant Risk Assessment: Substance Use Disorder (SUD) Risk Level: Low Opioid Risk Tool (ORT) Score: Total Score: 9 High Risk for Opioid Abuse (Score >8) Depression Scale Score:    Pharmacologic Plan: Continue therapy as is  Lab Work: Illicit Drugs  Lab Results  Component Value Date   THCU NEGATIVE 11/18/2011   PCPSCRNUR NEGATIVE 11/18/2011   MDMA NEGATIVE 11/18/2011   AMPHETMU NEGATIVE 11/18/2011   METHADONE NEGATIVE 11/18/2011   ETOH 245 11/18/2011    Inflammation Markers Lab Results  Component Value Date   ESRSEDRATE 4 09/27/2015   CRP <0.5 09/27/2015    Renal Function Lab Results  Component Value Date   BUN 8 09/13/2015   CREATININE 0.62* 09/13/2015   GFRAA 129 09/13/2015   GFRNONAA 112 09/13/2015    Hepatic Function Lab Results  Component Value Date   AST 46* 09/13/2015   ALT 31 09/13/2015   ALBUMIN 4.9 09/13/2015    Electrolytes Lab Results  Component Value Date   NA 138 09/13/2015   K 5.0 09/13/2015   CL 92* 09/13/2015   CALCIUM 9.5 09/13/2015   MG 1.3* 09/27/2015    Allergies  Joel Bryant has No Known Allergies.  Meds  The patient has a current medication list which includes the following prescription(s): aspirin, atenolol, atorvastatin, biotin, vitamin d3, clonazepam, clonazepam, diphenhydramine, esomeprazole, furosemide, lisinopril, magnesium oxide, metformin, morphine, multiple vitamins-minerals, omega-3 acid ethyl esters, sitagliptin, tizanidine, vitamin b-12, morphine, and morphine.  Current Outpatient Prescriptions on File Prior to Visit  Medication Sig  . atenolol (TENORMIN) 100 MG tablet Take 1 tablet (100 mg total) by mouth daily.  Marland Kitchen atorvastatin (LIPITOR) 20 MG tablet Take 1 tablet (20 mg total) by mouth daily at 6 PM.  . clonazePAM (KLONOPIN) 0.5 MG tablet Take 1 tablet (0.5 mg total) by mouth at bedtime as needed for anxiety.  . clonazePAM (KLONOPIN) 1 MG tablet Take 1 tablet (1 mg total) by mouth 2 (two) times daily as  needed.  Marland Kitchen esomeprazole (NEXIUM) 40 MG capsule Take 1 capsule (40 mg total) by mouth daily.  . furosemide (LASIX) 40 MG tablet Take 1 tablet (40 mg total) by mouth daily.  Marland Kitchen lisinopril (PRINIVIL,ZESTRIL) 2.5 MG tablet Take 1 tablet (2.5 mg total) by mouth daily.  . Magnesium Oxide 500 MG CAPS Take 1 capsule (500 mg total) by mouth daily.  . metFORMIN (GLUCOPHAGE) 1000 MG tablet Take 1 tablet (1,000 mg total) by mouth 2 (two) times daily.  Marland Kitchen omega-3 acid ethyl esters (LOVAZA) 1 g capsule Take 2 capsules (2 g total) by mouth 2 (two) times daily.  . sitaGLIPtin (JANUVIA) 50 MG tablet Take 1 tablet (50 mg total) by mouth daily.  Marland Kitchen tiZANidine (ZANAFLEX) 4 MG tablet Take 1 tablet (4 mg total) by mouth daily.   No current facility-administered medications on file prior to visit.    ROS  Constitutional: Afebrile, no chills, well hydrated and well nourished Gastrointestinal: negative Musculoskeletal:negative Neurological: negative Behavioral/Psych: negative  PFSH  Medical:  Mr. Brindle  has a past medical history of Diabetes mellitus without complication ([place]); Anxiety; Hyperlipidemia; Hypertension; GERD (gastroesophageal reflux disease); Hypomagnesemia; Hyperlipidemia; Chronic LBP (09/13/2015); and Chronic left hip pain (09/27/2015). Family: family history includes Alcohol abuse in his father; Cancer in his maternal grandfather; Colon polyps in his mother; Diabetes in his paternal grandmother; Retinitis pigmentosa in his maternal grandmother and mother. Surgical:  has past surgical history that includes Laminectomy (2001); Cataract extraction (1992); and Wrist surgery (Right). Tobacco:  reports that he has quit smoking. His smoking use included Cigarettes. He has never used smokeless tobacco. Alcohol:  reports that he drinks alcohol. Drug:  reports that he does not use illicit drugs.  Physical Exam  Vitals:  Today's Vitals   12/27/15 0804 12/27/15 [id number]  BP: 150/93   Pulse: 63   Temp: 98.1  F (36.7 C)   TempSrc: Oral   Resp: 18   Height: 5\' 9"  (1.753 m)   Weight: 235 lb (106.595 kg)   SpO2: 98%   PainSc:  5     Calculated BMI: Body mass index is 34.69 kg/(m^2).  General appearance: alert, cooperative, appears stated age, no distress and mildly obese Eyes: PERLA Respiratory: No evidence respiratory distress, no audible rales or ronchi and no use of accessory muscles of respiration  Cervical Spine Inspection: Normal anatomy Alignment: Symetrical ROM: Adequate  Upper Extremities Inspection: No gross anomalies detected ROM: Adequate Sensory: Normal Motor: Unremarkable  Thoracic Spine Inspection: No gross anomalies detected Alignment: Symetrical ROM: Adequate  Lumbar Spine Inspection: No gross anomalies detected Alignment: Symetrical ROM: Decreased  Gait: Antalgic (limping)  Lower Extremities Inspection: No gross anomalies detected ROM: Decreased for both hips Sensory:  Normal Motor: Unremarkable  Assessment & Plan  Primary Diagnosis & Pertinent Problem List: The primary encounter diagnosis was Chronic pain. 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  Visit Diagnosis: 1. Chronic pain   2. Encounter for therapeutic drug level monitoring   3. Long term current use of opiate analgesic   4. 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 Chronic hip pain, unspecified laterality   6. Chronic low back pain   7. Lumbar facet hypertrophy (Bilateral, Multilevel)   8. Lumbar facet syndrome (Bilateral)   9. Lumbar spondylosis, unspecified spinal osteoarthritis     Problem-specific  Plan(s): No problem-specific assessment & plan notes found for this encounter.   Plan of Care  Pharmacotherapy (Medications Ordered): Meds ordered this encounter  Medications  . morphine (MS CONTIN) 15 MG 12 hr tablet    Sig: Take 1 tablet (15 mg total) by mouth every 12 (twelve) hours.    Dispense:  60 tablet    Refill:  0    Do not place this medication, or any other prescription from our practice, on "Automatic Refill". Patient may have prescription filled one day early if pharmacy is closed on scheduled refill date. Do not fill until: 12/27/15 To last until: 01/26/16  . morphine (MS CONTIN) 15 MG 12 hr tablet    Sig: Take 1 tablet (15 mg total) by mouth every 12 (twelve) hours.    Dispense:  60 tablet    Refill:  0    Do not place this medication, or any other prescription from our practice, on "Automatic Refill". Patient may have prescription filled one day early if pharmacy is closed on scheduled refill date. Do not fill until: 01/26/16 To last until: 02/25/16  . morphine (MS CONTIN) 15 MG 12 hr tablet    Sig: Take 1 tablet (15 mg total) by mouth every 12 (twelve) hours.    Dispense:  60 tablet    Refill:  0    Do not place this medication, or any other prescription from our practice, on "Automatic Refill". Patient may have prescription filled one day early if pharmacy is closed on scheduled refill date. Do not fill until: 02/25/16 To last until: 03/26/16    [facility] & Procedure Ordered: Orders Placed This Encounter  Procedures  . LUMBAR FACET(MEDIAL BRANCH NERVE  BLOCK) MBNB    Standing Status: Standing     Number of Occurrences: 1     Standing Expiration Date: 12/26/2016    Scheduling Instructions:     Side: Bilateral     Level: L2, L3, L4, L5, & S1 Medial Branch Nerve     Sedation: With Sedation.     Timeframe: PRN Procedure. Patient will call to schedule.    Order Specific Question:  Where will this procedure be performed?    Answer:  [facility]  . HIP  INJECTION    Standing Status: Standing     Number of Occurrences: 1     Standing Expiration Date: 12/26/2016    Scheduling Instructions:     Side: Bilateral     Sedation: With Sedation.     Timeframe: PRN Procedure. Patient will call.  Raelene Bott Select 13 (MW), Urine    Volume: 30 ml(s). Minimum 3 ml of urine is needed. Document temperature of fresh sample. Indications: Long term (current) use of opiate analgesic [id number]    Imaging Ordered: None  Interventional Therapies: Scheduled: None at this time. PRN Procedures:  1. Diagnostic, bilateral, lumbar facet block under fluoroscopic guidance and IV sedation for the low back pain.  2. Diagnostic, bilateral, intra-articular hip injection under fluoroscopic guidance and IV sedation for the hip pain.    Referral(s) or Consult(s): None at this point.  Medications administered during this visit: Mr. Plumley had no medications administered during this visit.  Future Appointments Date Time Provider [place]  03/10/2016 10:20 AM Roselee Nova, MD [place] None  03/22/2016 8:00 AM Milinda Pointer, MD [facility] None    Primary Care Physician: Keith Rake, MD Location: [facility] Note by: Kathlen Brunswick. Dossie Arbour, M.D, DABA, DABAPM, DABPM, DABIPP, FIPP  Pain Score Disclaimer: We use the NRS-11 scale. This is a self-reported, subjective measurement of pain severity with only modest accuracy. It is used primarily to identify changes within a particular patient. It must be understood that outpatient pain scales are significantly less accurate that those used for research, where they can be applied under ideal controlled circumstances with minimal exposure to variables. In reality, the score is likely to be a combination of pain intensity and pain affect, where pain affect describes the degree of emotional arousal or changes in action readiness caused by the sensory experience of pain. Factors such as  social and work situation, setting, emotional state, anxiety levels, expectation, and prior pain experience may influence pain perception and show large inter-individual differences that may also be affected by time variables.

## 2015-12-27 NOTE — Progress Notes (Signed)
Safety precautions to be maintained throughout the outpatient stay will include: orient to surroundings, keep bed in low position, maintain call bell within reach at all times, provide assistance with transfer out of bed and ambulation.  0/60 pills remain morphine sulfate 15mg  filled 11/26/15   Here today for med refill

## 2016-01-01 LAB — TOXASSURE SELECT 13 (MW), URINE: PDF: 0

## 2016-01-17 ENCOUNTER — Telehealth: Payer: Self-pay | Admitting: Family Medicine

## 2016-01-17 DIAGNOSIS — R600 Localized edema: Secondary | ICD-10-CM

## 2016-01-17 MED ORDER — FUROSEMIDE 40 MG PO TABS: 40.0000 mg | ORAL_TABLET | Freq: Every day | ORAL | Status: DC

## 2016-01-17 NOTE — Telephone Encounter (Signed)
Medication has been refilled and sent to [facility] °

## 2016-01-17 NOTE — Telephone Encounter (Signed)
Patient stated that [facility] has been faxing [place] since last week requesting refill on Furosemide. He stated that he will be out by today. Please send refill on pharmacy.

## 2016-03-10 ENCOUNTER — Ambulatory Visit (INDEPENDENT_AMBULATORY_CARE_PROVIDER_SITE_OTHER): Payer: Medicare Other | Admitting: Family Medicine

## 2016-03-10 ENCOUNTER — Encounter: Payer: Self-pay | Admitting: Family Medicine

## 2016-03-10 DIAGNOSIS — F411 Generalized anxiety disorder: Secondary | ICD-10-CM | POA: Diagnosis not present

## 2016-03-10 DIAGNOSIS — G4701 Insomnia due to medical condition: Secondary | ICD-10-CM | POA: Diagnosis not present

## 2016-03-10 DIAGNOSIS — G8929 Other chronic pain: Secondary | ICD-10-CM

## 2016-03-10 DIAGNOSIS — R600 Localized edema: Secondary | ICD-10-CM

## 2016-03-10 DIAGNOSIS — E1140 Type 2 diabetes mellitus with diabetic neuropathy, unspecified: Secondary | ICD-10-CM

## 2016-03-10 DIAGNOSIS — E785 Hyperlipidemia, unspecified: Secondary | ICD-10-CM | POA: Diagnosis not present

## 2016-03-10 DIAGNOSIS — E781 Pure hyperglyceridemia: Secondary | ICD-10-CM | POA: Diagnosis not present

## 2016-03-10 LAB — GLUCOSE, POCT (MANUAL RESULT ENTRY): POC Glucose: 130 mg/dl — AB (ref 70–99)

## 2016-03-10 LAB — POCT GLYCOSYLATED HEMOGLOBIN (HGB A1C): Hemoglobin A1C: 5.9

## 2016-03-10 MED ORDER — SITAGLIPTIN PHOSPHATE 50 MG PO TABS: 50.0000 mg | ORAL_TABLET | Freq: Every day | ORAL | Status: DC

## 2016-03-10 MED ORDER — PREGABALIN 50 MG PO CAPS: 50.0000 mg | ORAL_CAPSULE | Freq: Three times a day (TID) | ORAL | Status: DC

## 2016-03-10 MED ORDER — CLONAZEPAM 0.5 MG PO TABS: 0.5000 mg | ORAL_TABLET | Freq: Every evening | ORAL | Status: DC | PRN

## 2016-03-10 MED ORDER — CLONAZEPAM 1 MG PO TABS: 1.0000 mg | ORAL_TABLET | Freq: Two times a day (BID) | ORAL | Status: DC | PRN

## 2016-03-10 MED ORDER — FUROSEMIDE 40 MG PO TABS: 40.0000 mg | ORAL_TABLET | Freq: Every day | ORAL | Status: DC

## 2016-03-10 NOTE — Progress Notes (Signed)
Name: Joel Bryant   MRN: [id number]    DOB: May 12, 1961   Date:03/10/2016       Progress Note  Subjective  Chief Complaint  Chief Complaint  Patient presents with  . Diabetes    follow up  . Hypertension  . Anxiety    Diabetes He presents for his follow-up diabetic visit. He has type 2 diabetes mellitus. His disease course has been improving. There are no hypoglycemic associated symptoms. There are no diabetic associated symptoms. Pertinent negatives for diabetes include no chest pain, no fatigue, no polydipsia and no polyuria. Symptoms are improving. Diabetic complications include peripheral neuropathy (Has been on Lyrica in the past which helped, requestign to be restarted on Lyrica.). Current diabetic treatment includes oral agent (dual therapy). He is following a diabetic diet. His breakfast blood glucose range is generally 110-130 mg/dl.  Anxiety Presents for follow-up visit. The problem has been unchanged. Symptoms include excessive worry, insomnia and irritability. Patient reports no chest pain or shortness of breath.   Past treatments include benzodiazephines. The treatment provided significant relief. Compliance with prior treatments has been good.   Swelling of Feet:  Pt. Presents with bilateral feet swelling, relieved with Furosemide 40 mg daily. Requesting refill for furosemide  Past Medical History  Diagnosis Date  . Diabetes mellitus without complication ([place])   . Anxiety   . Hyperlipidemia   . Hypertension   . GERD (gastroesophageal reflux disease)   . Hypomagnesemia   . Hyperlipidemia   . Chronic LBP 09/13/2015  . Chronic left hip pain 09/27/2015    Past Surgical History  Procedure Laterality Date  . Laminectomy  2001    lumbar  . Cataract extraction  1992    Insert Prosthetic Lens  . Wrist surgery Right     x2    Family History  Problem Relation Age of Onset  . Colon polyps Mother   . Retinitis pigmentosa Mother   . Retinitis pigmentosa Maternal  Grandmother   . Cancer Maternal Grandfather   . Diabetes Paternal Grandmother   . Alcohol abuse Father     Social History   Social History  . Marital Status: Divorced    Spouse Name: N/A  . Number of Children: N/A  . Years of Education: N/A   Occupational History  . Not on file.   Social History Main Topics  . Smoking status: Former Smoker    Types: Cigarettes  . Smokeless tobacco: Never Used     Comment: Quit smoking 4 years now  . Alcohol Use: 0.0 oz/week    0 Standard drinks or equivalent per week     Comment: occassional/seldom  . Drug Use: No  . Sexual Activity: Not on file   Other Topics Concern  . Not on file   Social History Narrative     Current outpatient prescriptions:  .  aspirin 81 MG tablet, Take 81 mg by mouth daily., Disp: , Rfl:  .  atenolol (TENORMIN) 100 MG tablet, Take 1 tablet (100 mg total) by mouth daily., Disp: 90 tablet, Rfl: 1 .  atorvastatin (LIPITOR) 20 MG tablet, Take 1 tablet (20 mg total) by mouth daily at 6 PM., Disp: 90 tablet, Rfl: 1 .  Biotin 5000 MCG CAPS, Take 5,000 capsules by mouth 2 (two) times daily., Disp: , Rfl:  .  Cholecalciferol (VITAMIN D3) 2000 units TABS, Take 2,000 tablets by mouth daily., Disp: , Rfl:  .  clonazePAM (KLONOPIN) 0.5 MG tablet, Take 1 tablet (0.5 mg total) by  mouth at bedtime as needed for anxiety., Disp: 90 tablet, Rfl: 0 .  clonazePAM (KLONOPIN) 1 MG tablet, Take 1 tablet (1 mg total) by mouth 2 (two) times daily as needed., Disp: 180 tablet, Rfl: 0 .  diphenhydrAMINE (SOMINEX) 25 MG tablet, Take 25 mg by mouth at bedtime as needed for sleep., Disp: , Rfl:  .  esomeprazole (NEXIUM) 40 MG capsule, Take 1 capsule (40 mg total) by mouth daily., Disp: 90 capsule, Rfl: 1 .  furosemide (LASIX) 40 MG tablet, Take 1 tablet (40 mg total) by mouth daily., Disp: 90 tablet, Rfl: 0 .  lisinopril (PRINIVIL,ZESTRIL) 2.5 MG tablet, Take 1 tablet (2.5 mg total) by mouth daily., Disp: 90 tablet, Rfl: 1 .  Magnesium Oxide  500 MG CAPS, Take 1 capsule (500 mg total) by mouth daily., Disp: 100 capsule, Rfl: PRN .  metFORMIN (GLUCOPHAGE) 1000 MG tablet, Take 1 tablet (1,000 mg total) by mouth 2 (two) times daily., Disp: 180 tablet, Rfl: 1 .  morphine (MS CONTIN) 15 MG 12 hr tablet, Take 1 tablet (15 mg total) by mouth every 12 (twelve) hours., Disp: 60 tablet, Rfl: 0 .  morphine (MS CONTIN) 15 MG 12 hr tablet, Take 1 tablet (15 mg total) by mouth every 12 (twelve) hours., Disp: 60 tablet, Rfl: 0 .  morphine (MS CONTIN) 15 MG 12 hr tablet, Take 1 tablet (15 mg total) by mouth every 12 (twelve) hours., Disp: 60 tablet, Rfl: 0 .  Multiple Vitamins-Minerals (SENTRY SENIOR PO), Take 1 tablet by mouth., Disp: , Rfl:  .  omega-3 acid ethyl esters (LOVAZA) 1 g capsule, Take 2 capsules (2 g total) by mouth 2 (two) times daily., Disp: 360 capsule, Rfl: 1 .  sitaGLIPtin (JANUVIA) 50 MG tablet, Take 1 tablet (50 mg total) by mouth daily., Disp: 90 tablet, Rfl: 0 .  tiZANidine (ZANAFLEX) 4 MG tablet, Take 1 tablet (4 mg total) by mouth daily., Disp: 90 tablet, Rfl: 1 .  vitamin B-12 (CYANOCOBALAMIN) 500 MCG tablet, Take 500 mcg by mouth daily., Disp: , Rfl:   No Known Allergies   Review of Systems  Constitutional: Positive for irritability. Negative for fever, chills and fatigue.  Respiratory: Negative for shortness of breath.   Cardiovascular: Positive for leg swelling. Negative for chest pain.  Neurological: Positive for tingling.  Endo/Heme/Allergies: Negative for polydipsia.  Psychiatric/Behavioral: The patient has insomnia.     Objective  Filed Vitals:   03/10/16 0959  BP: 138/84  Pulse: 71  Temp: 98.4 F (36.9 C)  TempSrc: Oral  Resp: 18  Height: 5\' 9"  (1.753 m)  Weight: 249 lb 4.8 oz (113.082 kg)  SpO2: 94%    Physical Exam  Constitutional: He is oriented to person, place, and time and well-developed, well-nourished, and in no distress.  Cardiovascular: Normal rate and regular rhythm.   Pulmonary/Chest:  Effort normal and breath sounds normal.  Musculoskeletal:       Right ankle: He exhibits swelling. Tenderness.       Left ankle: He exhibits swelling. Tenderness.  Neurological: He is alert and oriented to person, place, and time.  Psychiatric: Mood, memory, affect and judgment normal.  Nursing note and vitals reviewed.   Assessment & Plan  1. Generalized anxiety disorder Stable and responsive to clonazepam taken twice daily during the day. - clonazePAM (KLONOPIN) 1 MG tablet; Take 1 tablet (1 mg total) by mouth 2 (two) times daily as needed.  Dispense: 180 tablet; Refill: 0  2. Bilateral edema of lower extremity  -  furosemide (LASIX) 40 MG tablet; Take 1 tablet (40 mg total) by mouth daily.  Dispense: 90 tablet; Refill: 0  3. Insomnia secondary to chronic pain  - clonazePAM (KLONOPIN) 0.5 MG tablet; Take 1 tablet (0.5 mg total) by mouth at bedtime as needed for anxiety.  Dispense: 90 tablet; Refill: 0  4. Controlled type 2 diabetes mellitus with diabetic neuropathy, without long-term current use of insulin ([place]) Well-controlled diabetes, will start on Lyrica 50 mg 3 times a day for peripheral neuropathy. Follow-up in one month - POCT HgB A1C - POCT Glucose (CBG) - sitaGLIPtin (JANUVIA) 50 MG tablet; Take 1 tablet (50 mg total) by mouth daily.  Dispense: 90 tablet; Refill: 0 - pregabalin (LYRICA) 50 MG capsule; Take 1 capsule (50 mg total) by mouth 3 (three) times daily.  Dispense: 270 capsule; Refill: 0   Daevion Navarette [facility] A[facility] 03/10/2016 10:22 AM

## 2016-03-11 LAB — COMPREHENSIVE METABOLIC PANEL
ALT: 33 IU/L (ref 0–44)
AST: 33 IU/L (ref 0–40)
Albumin/Globulin Ratio: 2.4 — ABNORMAL HIGH (ref 1.2–2.2)
Albumin: 4.6 g/dL (ref 3.5–5.5)
Alkaline Phosphatase: 50 IU/L (ref 39–117)
BUN/Creatinine Ratio: 12 (ref 9–20)
BUN: 9 mg/dL (ref 6–24)
Bilirubin Total: 0.8 mg/dL (ref 0.0–1.2)
CO2: 28 mmol/L (ref 18–29)
Calcium: 9.2 mg/dL (ref 8.7–10.2)
Chloride: 101 mmol/L (ref 96–106)
Creatinine, Ser: 0.74 mg/dL — ABNORMAL LOW (ref 0.76–1.27)
GFR calc Af Amer: 120 mL/min/{1.73_m2} (ref >59)
GFR calc non Af Amer: 104 mL/min/{1.73_m2} (ref >59)
Globulin, Total: 1.9 g/dL (ref 1.5–4.5)
Glucose: 109 mg/dL — ABNORMAL HIGH (ref 65–99)
Potassium: 5.2 mmol/L (ref 3.5–5.2)
Sodium: 144 mmol/L (ref 134–144)
Total Protein: 6.5 g/dL (ref 6.0–8.5)

## 2016-03-11 LAB — LIPID PANEL
Chol/HDL Ratio: 2.9 ratio units (ref 0.0–5.0)
Cholesterol, Total: 121 mg/dL (ref 100–199)
HDL: 42 mg/dL (ref >39)
LDL Calculated: 47 mg/dL (ref 0–99)
Triglycerides: 159 mg/dL — ABNORMAL HIGH (ref 0–149)
VLDL Cholesterol Cal: 32 mg/dL (ref 5–40)

## 2016-03-22 ENCOUNTER — Encounter: Payer: Self-pay | Admitting: Pain Medicine

## 2016-03-22 ENCOUNTER — Ambulatory Visit: Payer: Medicare Other | Attending: Pain Medicine | Admitting: Pain Medicine

## 2016-03-22 VITALS — BP 145/82 | HR 58 | Temp 98.5°F | Resp 16 | Ht 69.0 in | Wt 240.0 lb

## 2016-03-22 DIAGNOSIS — M961 Postlaminectomy syndrome, not elsewhere classified: Secondary | ICD-10-CM

## 2016-03-22 DIAGNOSIS — R12 Heartburn: Secondary | ICD-10-CM | POA: Diagnosis not present

## 2016-03-22 DIAGNOSIS — M4806 Spinal stenosis, lumbar region: Secondary | ICD-10-CM | POA: Insufficient documentation

## 2016-03-22 DIAGNOSIS — M549 Dorsalgia, unspecified: Secondary | ICD-10-CM | POA: Diagnosis present

## 2016-03-22 DIAGNOSIS — K219 Gastro-esophageal reflux disease without esophagitis: Secondary | ICD-10-CM | POA: Diagnosis not present

## 2016-03-22 DIAGNOSIS — H3552 Pigmentary retinal dystrophy: Secondary | ICD-10-CM | POA: Insufficient documentation

## 2016-03-22 DIAGNOSIS — F1190 Opioid use, unspecified, uncomplicated: Secondary | ICD-10-CM

## 2016-03-22 DIAGNOSIS — M87850 Other osteonecrosis, pelvis: Secondary | ICD-10-CM | POA: Insufficient documentation

## 2016-03-22 DIAGNOSIS — M25559 Pain in unspecified hip: Secondary | ICD-10-CM | POA: Diagnosis present

## 2016-03-22 DIAGNOSIS — Z87891 Personal history of nicotine dependence: Secondary | ICD-10-CM | POA: Diagnosis not present

## 2016-03-22 DIAGNOSIS — G9619 Other disorders of meninges, not elsewhere classified: Secondary | ICD-10-CM | POA: Diagnosis not present

## 2016-03-22 DIAGNOSIS — M25551 Pain in right hip: Secondary | ICD-10-CM | POA: Insufficient documentation

## 2016-03-22 DIAGNOSIS — I10 Essential (primary) hypertension: Secondary | ICD-10-CM | POA: Insufficient documentation

## 2016-03-22 DIAGNOSIS — Z79899 Other long term (current) drug therapy: Secondary | ICD-10-CM | POA: Diagnosis not present

## 2016-03-22 DIAGNOSIS — M25562 Pain in left knee: Secondary | ICD-10-CM | POA: Insufficient documentation

## 2016-03-22 DIAGNOSIS — Z79891 Long term (current) use of opiate analgesic: Secondary | ICD-10-CM | POA: Insufficient documentation

## 2016-03-22 DIAGNOSIS — F5105 Insomnia due to other mental disorder: Secondary | ICD-10-CM | POA: Diagnosis not present

## 2016-03-22 DIAGNOSIS — E781 Pure hyperglyceridemia: Secondary | ICD-10-CM | POA: Diagnosis not present

## 2016-03-22 DIAGNOSIS — M545 Low back pain, unspecified: Secondary | ICD-10-CM

## 2016-03-22 DIAGNOSIS — G8929 Other chronic pain: Secondary | ICD-10-CM | POA: Diagnosis not present

## 2016-03-22 DIAGNOSIS — E785 Hyperlipidemia, unspecified: Secondary | ICD-10-CM | POA: Diagnosis not present

## 2016-03-22 DIAGNOSIS — G4733 Obstructive sleep apnea (adult) (pediatric): Secondary | ICD-10-CM | POA: Insufficient documentation

## 2016-03-22 DIAGNOSIS — M539 Dorsopathy, unspecified: Secondary | ICD-10-CM

## 2016-03-22 DIAGNOSIS — E1140 Type 2 diabetes mellitus with diabetic neuropathy, unspecified: Secondary | ICD-10-CM | POA: Insufficient documentation

## 2016-03-22 DIAGNOSIS — E8881 Metabolic syndrome: Secondary | ICD-10-CM | POA: Diagnosis not present

## 2016-03-22 DIAGNOSIS — Z86010 Personal history of colonic polyps: Secondary | ICD-10-CM | POA: Insufficient documentation

## 2016-03-22 DIAGNOSIS — Z6835 Body mass index (BMI) 35.0-35.9, adult: Secondary | ICD-10-CM | POA: Insufficient documentation

## 2016-03-22 DIAGNOSIS — Z5181 Encounter for therapeutic drug level monitoring: Secondary | ICD-10-CM | POA: Diagnosis not present

## 2016-03-22 DIAGNOSIS — Z7982 Long term (current) use of aspirin: Secondary | ICD-10-CM | POA: Insufficient documentation

## 2016-03-22 DIAGNOSIS — Z7984 Long term (current) use of oral hypoglycemic drugs: Secondary | ICD-10-CM | POA: Insufficient documentation

## 2016-03-22 DIAGNOSIS — M47816 Spondylosis without myelopathy or radiculopathy, lumbar region: Secondary | ICD-10-CM | POA: Diagnosis not present

## 2016-03-22 DIAGNOSIS — M109 Gout, unspecified: Secondary | ICD-10-CM | POA: Diagnosis not present

## 2016-03-22 DIAGNOSIS — M25552 Pain in left hip: Secondary | ICD-10-CM | POA: Insufficient documentation

## 2016-03-22 DIAGNOSIS — R600 Localized edema: Secondary | ICD-10-CM | POA: Diagnosis not present

## 2016-03-22 DIAGNOSIS — F419 Anxiety disorder, unspecified: Secondary | ICD-10-CM | POA: Insufficient documentation

## 2016-03-22 MED ORDER — MORPHINE SULFATE ER 15 MG PO TBCR: 15.0000 mg | EXTENDED_RELEASE_TABLET | Freq: Two times a day (BID) | ORAL | Status: DC

## 2016-03-22 MED ORDER — MORPHINE SULFATE ER 15 MG PO TBCR
15.0000 mg | EXTENDED_RELEASE_TABLET | Freq: Two times a day (BID) | ORAL | Status: DC
Start: 2016-03-22 — End: 2016-06-06

## 2016-03-22 NOTE — Patient Instructions (Signed)
Facet Blocks Patient Information  Description: The facets are joints in the spine between the vertebrae.  Like any joints in the body, facets can become irritated and painful.  Arthritis can also effect the facets.  By injecting steroids and local anesthetic in and around these joints, we can temporarily block the nerve supply to them.  Steroids act directly on irritated nerves and tissues to reduce selling and inflammation which often leads to decreased pain.  Facet blocks may be done anywhere along the spine from the neck to the low back depending upon the location of your pain.   After numbing the skin with local anesthetic (like Novocaine), a small needle is passed onto the facet joints under x-ray guidance.  You may experience a sensation of pressure while this is being done.  The entire block usually lasts about 15-25 minutes.   Conditions which may be treated by facet blocks:   Low back/buttock pain  Neck/shoulder pain  Certain types of headaches  Preparation for the injection:  1. Do not eat any solid food or dairy products within 8 hours of your appointment. 2. You may drink clear liquid up to 3 hours before appointment.  Clear liquids include water, black coffee, juice or soda.  No milk or cream please. 3. You may take your regular medication, including pain medications, with a sip of water before your appointment.  Diabetics should hold regular insulin (if taken separately) and take 1/2 normal NPH dose the morning of the procedure.  Carry some sugar containing items with you to your appointment. 4. A driver must accompany you and be prepared to drive you home after your procedure. 5. Bring all your current medications with you. 6. An IV may be inserted and sedation may be given at the discretion of the physician. 7. A blood pressure cuff, EKG and other monitors will often be applied during the procedure.  Some patients may need to have extra oxygen administered for a short  period. 8. You will be asked to provide medical information, including your allergies and medications, prior to the procedure.  We must know immediately if you are taking blood thinners (like Coumadin/Warfarin) or if you are allergic to IV iodine contrast (dye).  We must know if you could possible be pregnant.  Possible side-effects:   Bleeding from needle site  Infection (rare, may require surgery)  Nerve injury (rare)  Numbness & tingling (temporary)  Difficulty urinating (rare, temporary)  Spinal headache (a headache worse with upright posture)  Light-headedness (temporary)  Pain at injection site (serveral days)  Decreased blood pressure (rare, temporary)  Weakness in arm/leg (temporary)  Pressure sensation in back/neck (temporary)   Call if you experience:   Fever/chills associated with headache or increased back/neck pain  Headache worsened by an upright position  New onset, weakness or numbness of an extremity below the injection site  Hives or difficulty breathing (go to the emergency room)  Inflammation or drainage at the injection site(s)  Severe back/neck pain greater than usual  New symptoms which are concerning to you  Please note:  Although the local anesthetic injected can often make your back or neck feel good for several hours after the injection, the pain will likely return. It takes 3-7 days for steroids to work.  You may not notice any pain relief for at least one week.  If effective, we will often do a series of 2-3 injections spaced 3-6 weeks apart to maximally decrease your pain.  After the initial   series, you may be a candidate for a more permanent nerve block of the facets.  If you have any questions, please call [phone] [facility] 

## 2016-03-22 NOTE — Progress Notes (Signed)
Patient's Name: Joel Bryant  Patient type: Established  MRN: [id number]  Service setting: Ambulatory outpatient  DOB: 01-25-1961  Location: [facility]  DOS: 03/22/2016  Primary Care Physician: Keith Rake, MD  Note by: Kathlen Brunswick. Dossie Arbour, M.D, DABA, Sarita Haver, DABPM, Milagros Evener, FIPP  Referring Physician: Roselee Nova, MD  Specialty: Board-Certified Interventional Pain Management  Last Visit to Pain Management: 12/27/2015   Primary Reason(s) for Visit: Encounter for prescription drug management (Level of risk: moderate) CC: Back Pain and Hip Pain   HPI  Joel Bryant is a 55 y.o. year old, male patient, who returns today as an established patient. 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); Dietary counseling and surveillance; Long term current use of opiate analgesic; Long term prescription opiate use; Opiate use (30 MME/Day); Encounter for therapeutic drug level monitoring; Encounter for chronic pain management; Lumbar spondylosis; Failed back surgical syndrome; Epidural fibrosis; Chronic pain; Chronic low back pain (Location of Primary Source of Pain) (Bilateral) (L>R); Hypomagnesemia; Encounter for long-term (current) use of other high-risk medications; Encounter for opiate analgesic use agreement; Opioid use agreement exists; Chronic left knee pain; Lumbar spinal stenosis (L4-5); Lumbar foraminal stenosis (Bilateral L3-4 and L4-5); Lumbar facet hypertrophy (Bilateral, Multilevel); Lumbar facet  syndrome (Bilateral); 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 His primarily concern today is the Back Pain and Hip Pain   Pain Assessment: Self-Reported Pain Score: 2  Reported level is compatible with observation Pain Type: Chronic pain Pain Location: Back Pain Orientation: Lower Pain Descriptors / Indicators: Sharp, Throbbing Pain Frequency: Intermittent  The patient comes into the clinics today for pharmacological management of his chronic pain. I last saw this patient on 12/27/2015. The patient  reports that he does not use illicit drugs. His body mass index is 35.43 kg/(m^2).  Date of Last Visit: 12/27/15 Service Provided on Last Visit: Med Refill  Controlled Substance Pharmacotherapy Assessment & REMS (Risk Evaluation and Mitigation Strategy)  Analgesic: MS Contin (morphine ER) 15 mg every 12 hours (30 mg/day) Pill Count: Morphine pill count #6/60 Filled 02-25-16 MME/day: 30 mg/day Pharmacokinetics: Onset of action (Liberation/Absorption): Within expected pharmacological parameters Time to Peak effect (Distribution): Timing and results are as within normal expected parameters Duration of action (Metabolism/Excretion): Within normal limits for medication Pharmacodynamics: Analgesic Effect: More than 50% Activity Facilitation: Medication(s) allow patient to sit, stand, walk, and do the basic ADLs Perceived Effectiveness: Described as relatively effective, allowing for increase in activities of daily living (ADL) Side-effects or Adverse reactions: None reported Monitoring: [place] PMP: Online review of the past 32-month period conducted. Compliant with practice rules and regulations UDS Results/interpretation: Last UDS done on 12/27/2015 came back within normal limits with no unexpected results. The patient is still combining a benzodiazepine  with the opioids despite the fact that he was told of the CDC guidelines and warnings. Medication Assessment Form: Reviewed. Patient indicates being compliant with therapy Treatment compliance: Compliant Risk Assessment: Aberrant Behavior: None observed today Substance Use Disorder (SUD) Risk Level: Moderate-to-high Risk of opioid abuse or dependence:  0.7-3.0% with doses ? 36 MME/day and 6.1-26% with doses ? 120 MME/day. Opioid Risk Tool (ORT) Score: Total Score: 6 Moderate Risk for SUD (Score between 4-7) Depression Scale Score: PHQ-2: PHQ-2 Total Score: 0 No depression (0) PHQ-9: PHQ-9 Total Score: 0 No depression (0-4)  Pharmacologic Plan: No change in therapy, at this time  Laboratory Chemistry  Inflammation Markers Lab Results  Component Value Date   ESRSEDRATE 4 09/27/2015   CRP <0.5 09/27/2015    Renal Function Lab Results  Component Value Date   BUN 9 03/10/2016   CREATININE 0.74* 03/10/2016   GFRAA 120 03/10/2016   GFRNONAA 104 03/10/2016    Hepatic Function Lab Results  Component Value Date   AST 33 03/10/2016   ALT 33 03/10/2016   ALBUMIN 4.6 03/10/2016    Electrolytes Lab Results  Component Value Date   NA 144 03/10/2016   K 5.2 03/10/2016   CL 101 03/10/2016   CALCIUM 9.2 03/10/2016   MG 1.3* 09/27/2015    Pain Modulating Vitamins No results found for: [place], VD125OH2TOT, [id number], [id number], VITAMINB12  Coagulation Parameters No results found for: INR, LABPROT  Note: I personally reviewed the above data. Results made available to patient.  Recent Diagnostic Imaging  No results found.  Meds  The patient has a current medication list which includes the following prescription(s): aspirin, atenolol, atorvastatin, biotin, vitamin d3, clonazepam, clonazepam, diphenhydramine-acetaminophen, esomeprazole, furosemide, lisinopril, magnesium oxide, metformin, morphine, morphine, morphine, multiple vitamins-minerals, omega-3 acid ethyl esters,  pregabalin, sitagliptin, tizanidine, and vitamin b-12.  Current Outpatient Prescriptions on File Prior to Visit  Medication Sig  . aspirin 81 MG tablet Take 81 mg by mouth daily.  Marland Kitchen atenolol (TENORMIN) 100 MG tablet Take 1 tablet (100 mg total) by mouth daily.  Marland Kitchen atorvastatin (LIPITOR) 20 MG tablet Take 1 tablet (20 mg total) by mouth daily at 6 PM.  . Biotin 5000 MCG CAPS Take 5,000 capsules by mouth 2 (two) times daily.  . Cholecalciferol (VITAMIN D3) 2000 units TABS Take 2,000 tablets by mouth daily.  . clonazePAM (KLONOPIN) 0.5 MG tablet Take 1 tablet (0.5 mg total) by mouth at bedtime as needed for anxiety.  . clonazePAM (KLONOPIN) 1 MG tablet Take 1 tablet (1 mg total) by mouth 2 (two) times daily as needed.  Marland Kitchen esomeprazole (NEXIUM) 40 MG capsule Take 1 capsule (40 mg total) by mouth daily.  . furosemide (LASIX) 40 MG tablet Take 1 tablet (40 mg total) by mouth daily.  Marland Kitchen lisinopril (PRINIVIL,ZESTRIL) 2.5 MG tablet Take 1 tablet (2.5 mg total) by mouth daily.  . Magnesium Oxide 500 MG CAPS Take 1 capsule (500 mg total) by mouth daily.  . metFORMIN (GLUCOPHAGE) 1000 MG tablet Take 1 tablet (1,000 mg total) by mouth 2 (two) times daily.  . Multiple Vitamins-Minerals (SENTRY SENIOR PO) Take 1 tablet by mouth.  . omega-3 acid ethyl esters (LOVAZA) 1 g capsule Take 2 capsules (2 g total) by mouth 2 (two) times daily.  . pregabalin (LYRICA) 50 MG capsule Take 1 capsule (50 mg total) by mouth 3 (three) times daily.  . sitaGLIPtin (JANUVIA) 50 MG tablet Take 1 tablet (50 mg total) by mouth daily.  Marland Kitchen tiZANidine (ZANAFLEX) 4 MG tablet Take 1 tablet (4 mg total) by mouth daily.  . vitamin B-12 (CYANOCOBALAMIN) 500 MCG tablet Take 500 mcg by mouth daily.   No current facility-administered medications on file prior to visit.    ROS  Constitutional: Denies any fever or chills Gastrointestinal: No reported hemesis,  hematochezia, vomiting, or acute GI distress Musculoskeletal: Denies any acute  onset joint swelling, redness, loss of ROM, or weakness Neurological: No reported episodes of acute onset apraxia, aphasia, dysarthria, agnosia, amnesia, paralysis, loss of coordination, or loss of consciousness  Allergies  Mr. Lecher has No Known Allergies.  [place]  Medical:  Mr. Mccuskey  has a past medical history of Diabetes mellitus without complication ([place]); Anxiety; Hyperlipidemia; Hypertension; GERD (gastroesophageal reflux disease); Hypomagnesemia; Hyperlipidemia; Chronic LBP (09/13/2015); and Chronic left hip pain (09/27/2015). Family: family history includes Alcohol abuse in his father; Cancer in his maternal grandfather; Colon polyps in his mother; Diabetes in his paternal grandmother; Retinitis pigmentosa in his maternal grandmother and mother. Surgical:  has past surgical history that includes Laminectomy (2001); Cataract extraction (1992); and Wrist surgery (Right). Tobacco:  reports that he has quit smoking. His smoking use included Cigarettes. He has never used smokeless tobacco. Alcohol:  reports that he drinks alcohol. Drug:  reports that he does not use illicit drugs.  Constitutional Exam  Vitals: Blood pressure 145/82, pulse 58, temperature 98.5 F (36.9 C), resp. rate 16, height 5\' 9"  (1.753 m), weight 240 lb (108.863 kg), SpO2 99 %. General appearance: Well nourished, well developed, and well hydrated. In no acute distress Calculated BMI/Body habitus: Body mass index is 35.43 kg/(m^2). (35-39.9 kg/m2) Severe obesity (Class II) - 136% higher incidence of chronic pain Psych/Mental status: Alert and oriented x 3 (person, place, & time) Eyes: PERLA Respiratory: No evidence of acute respiratory distress  Cervical Spine Exam  Inspection: No masses, redness, or swelling Alignment: Symmetrical ROM: Functional: ROM is within functional limits [facility]) Stability: No instability detected Muscle strength & Tone: Functionally intact Sensory: Unimpaired Palpation: No complaints of  tenderness  Upper Extremity (UE) Exam    Side: Right upper extremity  Side: Left upper extremity  Inspection: No masses, redness, swelling, or asymmetry  Inspection: No masses, redness, swelling, or asymmetry  ROM:  ROM:  Functional: ROM is within functional limits [facility])  Functional: ROM is within functional limits [facility])  Muscle strength & Tone: Functionally intact  Muscle strength & Tone: Functionally intact  Sensory: Unimpaired  Sensory: Unimpaired  Palpation: Non-contributory  Palpation: Non-contributory   Thoracic Spine Exam  Inspection: No masses, redness, or swelling Alignment: Symmetrical ROM: Functional: ROM is within functional limits [facility]) Stability: No instability detected Sensory: Unimpaired Muscle strength & Tone: Functionally intact Palpation: No complaints of tenderness  Lumbar Spine Exam  Inspection: No masses, redness, or swelling Alignment: Symmetrical ROM: Functional: Decreased ROM Stability: No instability detected Muscle strength & Tone: Functionally intact Sensory: Unimpaired Palpation: Tender Provocative Tests: Lumbar Hyperextension and rotation test: Positive for bilateral lumbar facet pain Patrick's Maneuver: deferred  Gait & Posture Assessment  Ambulation: Unassisted Gait: Unaffected Posture: WNL  Lower Extremity Exam    Side: Right lower extremity  Side: Left lower extremity  Inspection: No masses, redness, swelling, or asymmetry ROM:  Inspection: No masses, redness, swelling, or asymmetry ROM:  Functional: ROM is within functional limits [facility])  Functional: ROM is within functional limits [facility])  Muscle strength & Tone: Functionally intact  Muscle strength & Tone: Functionally intact  Sensory: Unimpaired  Sensory: Unimpaired  Palpation: Non-contributory  Palpation: Non-contributory   Assessment & Plan  Primary Diagnosis & Pertinent Problem List: The primary encounter diagnosis was Chronic pain. Diagnoses of Encounter for therapeutic drug  level monitoring, Long term current use of opiate analgesic, Opiate use (30 MME/Day), Chronic low back pain (Location of Primary Source of Pain) (Bilateral) (L>R),  and Failed back surgical syndrome were also pertinent to this visit.  Visit Diagnosis: 1. Chronic pain   2. Encounter for therapeutic drug level monitoring   3. Long term current use of opiate analgesic   4. Opiate use (30 MME/Day)   5. Chronic low back pain (Location of Primary Source of Pain) (Bilateral) (L>R)   6. Failed back surgical syndrome     Problems updated and reviewed during this visit: Problem  Opiate use (30 MME/Day)   Analgesic: MS Contin (morphine ER) 15 mg every 12 hours (30 mg/day)     Problem-specific Plan(s): No problem-specific assessment & plan notes found for this encounter.  No new assessment & plan notes have been filed under this hospital service since the last note was generated. Service: Pain Management   Plan of Care   Problem List Items Addressed This Visit      High   Chronic low back pain (Location of Primary Source of Pain) (Bilateral) (L>R) (Chronic)   Relevant Medications   morphine (MS CONTIN) 15 MG 12 hr tablet   morphine (MS CONTIN) 15 MG 12 hr tablet   morphine (MS CONTIN) 15 MG 12 hr tablet   Other Relevant Orders   LUMBAR FACET(MEDIAL BRANCH NERVE BLOCK) MBNB   Chronic pain - Primary (Chronic)   Relevant Medications   morphine (MS CONTIN) 15 MG 12 hr tablet   morphine (MS CONTIN) 15 MG 12 hr tablet   morphine (MS CONTIN) 15 MG 12 hr tablet   Failed back surgical syndrome (Chronic)   Relevant Medications   morphine (MS CONTIN) 15 MG 12 hr tablet   morphine (MS CONTIN) 15 MG 12 hr tablet   morphine (MS CONTIN) 15 MG 12 hr tablet     Medium   Encounter for therapeutic drug level monitoring   Long term current use of opiate analgesic (Chronic)   Relevant Orders   ToxASSURE Select 13 (MW), Urine   Opiate use (30 MME/Day) (Chronic)       Pharmacotherapy  (Medications Ordered): Meds ordered this encounter  Medications  . morphine (MS CONTIN) 15 MG 12 hr tablet    Sig: Take 1 tablet (15 mg total) by mouth every 12 (twelve) hours.    Dispense:  60 tablet    Refill:  0    Do not place this medication, or any other prescription from our practice, on "Automatic Refill". Patient may have prescription filled one day early if pharmacy is closed on scheduled refill date. Do not fill until: 03/26/16 To last until: 04/25/16  . morphine (MS CONTIN) 15 MG 12 hr tablet    Sig: Take 1 tablet (15 mg total) by mouth every 12 (twelve) hours.    Dispense:  60 tablet    Refill:  0    Do not place this medication, or any other prescription from our practice, on "Automatic Refill". Patient may have prescription filled one day early if pharmacy is closed on scheduled refill date. Do not fill until: 04/25/16 To last until: 05/25/16  . morphine (MS CONTIN) 15 MG 12 hr tablet    Sig: Take 1 tablet (15 mg total) by mouth every 12 (twelve) hours.    Dispense:  60 tablet    Refill:  0    Do not place this medication, or any other prescription from our practice, on "Automatic Refill". Patient may have prescription filled one day early if pharmacy is closed on scheduled refill date. Do not fill until: 05/25/16 To last until:  06/24/16    Lab-work & Procedure Ordered: Orders Placed This Encounter  Procedures  . LUMBAR FACET(MEDIAL BRANCH NERVE BLOCK) MBNB  . ToxASSURE Select 13 (MW), Urine    Imaging Ordered: None  Interventional Therapies: Scheduled:  None at this time    Considering:   1. Diagnostic bilateral lumbar facet block under fluoroscopic guidance and IV sedation.  2. Possible spinal cord stimulator trial.   PRN Procedures:  Diagnostic bilateral lumbar facet block under fluoroscopic guidance and IV sedation.   Referral(s) or Consult(s): None at this time.  New Prescriptions   No medications on file    Medications administered during this  visit: Mr. Contreraz had no medications administered during this visit.  Requested PM Follow-up: Return in about 2 months (around 06/05/2016) for Medication Management, (3-Mo), Procedure (PRN - Patient will call).  Future Appointments Date Time Provider [place]  06/09/2016 10:20 AM Roselee Nova, MD [place] None    Primary Care Physician: Keith Rake, MD Location: [facility] Note by: Kathlen Brunswick. Dossie Arbour, M.D, DABA, DABAPM, DABPM, DABIPP, FIPP  Pain Score Disclaimer: We use the NRS-11 scale. This is a self-reported, subjective measurement of pain severity with only modest accuracy. It is used primarily to identify changes within a particular patient. It must be understood that outpatient pain scales are significantly less accurate that those used for research, where they can be applied under ideal controlled circumstances with minimal exposure to variables. In reality, the score is likely to be a combination of pain intensity and pain affect, where pain affect describes the degree of emotional arousal or changes in action readiness caused by the sensory experience of pain. Factors such as social and work situation, setting, emotional state, anxiety levels, expectation, and prior pain experience may influence pain perception and show large inter-individual differences that may also be affected by time variables.  Patient instructions provided during this appointment: There are no Patient Instructions on file for this visit.

## 2016-03-22 NOTE — Progress Notes (Signed)
Safety precautions to be maintained throughout the outpatient stay will include: orient to surroundings, keep bed in low position, maintain call bell within reach at all times, provide assistance with transfer out of bed and ambulation. Morphine pill count #6/60  Filled 02-25-16

## 2016-03-23 ENCOUNTER — Telehealth: Payer: Self-pay | Admitting: Family Medicine

## 2016-03-23 NOTE — Telephone Encounter (Signed)
Pt needs results on labs.

## 2016-03-23 NOTE — Telephone Encounter (Signed)
Called and informed patient of recent lab results, mainly triglycerides have improved considerably from 6 months ago, continue on [place] and recheck in 3 months

## 2016-03-30 LAB — TOXASSURE SELECT 13 (MW), URINE

## 2016-06-06 NOTE — Progress Notes (Signed)
Patient's Name: Joel Bryant  Patient type: Established  MRN: [id number]  Service setting: Ambulatory outpatient  DOB: [birth year]  Location: [facility]  DOS: 06/07/2016  Primary Care Physician: Keith Rake, MD  Note by: Kathlen Brunswick. Dossie Arbour, M.D  Referring Physician: Roselee Nova, MD  Specialty: Interventional Pain Management  Last Visit to Pain Management: 03/22/2016   Primary Reason(s) for Visit: Encounter for prescription drug management (Level of risk: moderate) CC: Back Pain (low)   HPI  [name] is a 55 y.o. year old, male patient, who returns today as an established patient. He has Anxiety disorder; Hypertriglyceridemia; BP (high blood pressure); Dyslipidemia; Controlled type 2 diabetes mellitus with diabetic neuropathy, without long-term current use of insulin ([place]); Brash; Insomnia secondary to anxiety; Retinitis pigmentosa; Edema leg; Gout; Obstructive apnea; Diabetes mellitus, type 2 ([place]); Can't get food down; H/O adenomatous polyp of colon; Leg swelling; Dysmetabolic syndrome; Adiposity; Pneumococcal vaccination given; Post Laminectomy Syndrome (L4-5 Hemilaminectomy and partial Facetectomy); Dietary counseling and surveillance; Long term current use of opiate analgesic; Long term prescription opiate use; Opiate use (30 MME/Day); Encounter for therapeutic drug level monitoring; Encounter for chronic pain management; Lumbar spondylosis; Failed back surgical syndrome; Epidural fibrosis; Chronic pain; Chronic low back pain (Location of Primary Source of Pain) (Bilateral) (L>R); Hypomagnesemia; Encounter for long-term (current) use of other high-risk medications; Encounter for opiate analgesic use agreement; Opioid use agreement exists; Chronic left knee pain; Lumbar spinal stenosis (L4-5); 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 His primarily concern today is the Back Pain (low)   Pain Assessment: Self-Reported Pain Score: 2              Reported level is compatible with observation       Pain Type: Chronic pain Pain Location: Back Pain Orientation: Lower Pain Descriptors / Indicators: Shooting, Dull Pain Frequency: Constant  The patient comes into the clinics today for pharmacological management of his chronic pain. I last saw this patient on 03/22/2016. The patient  reports that he does not use drugs. His body mass index is 36.92 kg/m. The patient indicates doing extremely well with his current regimen and he is not requesting any changes. He is aware that he has some PRN procedures that he can request, depending on whether or not he has a flareup.  Date of Last Visit: 03/22/16 Service Provided on Last Visit: Med Refill  Controlled Substance Pharmacotherapy Assessment & REMS (Risk Evaluation and Mitigation Strategy)  Analgesic: MS Contin (morphine ER) 15 mg every 12 hours (30 mg/day) MME/day: 30 mg/day Pill Count: Bottle labeled morphine sulfate 15 mg #33/60  Filled 05-25-16 Pharmacokinetics: Onset of action (Liberation/Absorption): Within expected pharmacological parameters Time to Peak effect (Distribution): Timing and results are as within normal expected parameters Duration of action (Metabolism/Excretion): Within normal limits for medication Pharmacodynamics: Analgesic Effect: More than 50% Activity Facilitation: Medication(s) allow patient to sit, stand, walk, and do the basic ADLs Perceived Effectiveness: Described as relatively effective, allowing for increase in activities of daily living (ADL) Side-effects or Adverse reactions: None reported Monitoring: [place] PMP: Online review of the past 54-month period conducted. Compliant with practice rules and  regulations Last UDS on record: ToxAssure Select 13  Date Value Ref Range Status  03/22/2016 FINAL  Final    Comment:    ====================================================================  TOXASSURE SELECT 13 (MW) ==================================================================== Test                             Result       Flag       Units Drug Present and Declared for Prescription Verification   7-aminoclonazepam              251          EXPECTED   ng/mg creat    7-aminoclonazepam is an expected metabolite of clonazepam. Source    of clonazepam is a scheduled prescription medication.   Morphine                       11645        EXPECTED   ng/mg creat    Potential sources of large amounts of morphine in the absence of    codeine include administration of morphine or use of heroin. ==================================================================== Test                      Result    Flag   Units      Ref Range   Creatinine              73               mg/dL      >=20 ==================================================================== Declared Medications:  The flagging and interpretation on this report are based on the  following declared medications.  Unexpected results may arise from  inaccuracies in the declared medications.  **Note: The testing scope of this panel includes these medications:  Clonazepam  Morphine  **Note: The testing scope of this panel does not include following  reported medications:  Acetaminophen  Aspirin  Atenolol  Atorvastatin  Cholecalciferol  Cyanocobalamin  Diphenhydramine  Esomeprazole  Furosemide  Lisinopril  Magnesium Oxide  Metformin  Multivitamin  Pregabalin  Sitagliptin  Supplement (Omega-3)  Tizanidine  Vitamin B (Biotin) ==================================================================== For clinical consultation, please call [phone]. ====================================================================    UDS  interpretation: Compliant Patient informed of the CDC guidelines and recommendations to stay away from the concomitant use of benzodiazepines and opioids due to the increased risk of respiratory depression and death. Medication Assessment Form: Reviewed. Patient indicates being compliant with therapy Treatment compliance: Compliant Risk Assessment: Aberrant Behavior: None observed today Substance Use Disorder (SUD) Risk Level: Moderate-to-high Risk of opioid abuse or dependence: 0.7-3.0% with doses ? 36 MME/day and 6.1-26% with doses ? 120 MME/day. Opioid Risk Tool (ORT) Score: Total Score: 21 High Risk for Opioid Abuse (Score >8) Depression Scale Score: PHQ-2: PHQ-2 Total Score: 0 No depression (0) PHQ-9: PHQ-9 Total Score: 0 No depression (0-4)  Pharmacologic Plan: No change in therapy, at this time  Laboratory Chemistry  Inflammation Markers Lab Results  Component Value Date   ESRSEDRATE 4 09/27/2015   CRP <0.5 09/27/2015    Renal Function Lab Results  Component Value Date   BUN 9 03/10/2016   CREATININE 0.74 (L) 03/10/2016   GFRAA 120 03/10/2016   GFRNONAA 104 03/10/2016    Hepatic Function Lab Results  Component Value Date   AST 33 03/10/2016   ALT 33 03/10/2016   ALBUMIN 4.6 03/10/2016    Electrolytes Lab Results  Component Value Date   NA 144 03/10/2016   K 5.2 03/10/2016   CL 101 03/10/2016   CALCIUM 9.2 03/10/2016   MG 1.3 (L) 09/27/2015  Pain Modulating Vitamins Lab Results  Component Value Date   25OHVITD1 17 (L) 09/27/2015   25OHVITD2 <1.0 09/27/2015   25OHVITD3 17 09/27/2015    Coagulation Parameters Lab Results  Component Value Date   PLT 211 11/18/2011    Cardiovascular Lab Results  Component Value Date   HGB 15.9 11/18/2011   HCT 45.8 11/18/2011    Note: Lab results reviewed.  Recent Diagnostic Imaging  Lumbosacral Imaging: Lumbar MR w/wo contrast:  Results for orders placed in visit on 06/24/00  MR Lumbar Spine W Wo  Contrast   Narrative FINDINGS HISTORY:  L4-5 LATERAL DISK.  LOW BACK PAIN. LUMBAR SPINE SERIES: THERE ARE FIVE LUMBAR TYPE VERTEBRAL BODIES WITH MILD SCOLIOSIS CONVEX TO THE LEFT.  THERE IS DISK SPACE NARROWING AT L4-5 AND, TO A LESSER EXTENT, AT L5-S1.  THERE HAS BEEN LEFT HEMILAMINECTOMY AT L4-5.  THERE IS FACET DEGENERATION BILATERALLY AT L3-4 AND L5-S1.  I THINK THERE HAS BEEN PARTIAL FACETECTOMY ON THE LEFT AT L4-5. IMPRESSION AS ABOVE. MRI OF THE LUMBAR SPINE: MULTIPLANAR T1 AND T2-WEIGHTED IMAGING WAS PERFORMED INCLUDING IMAGING AFTER INTRAVENOUS ADMINISTRATION OF OMNISCAN. COMPARISON IS MADE TO A PREOPERATIVE STUDY PERFORMED AT [facility] ON 08/23/98. THERE IS NO ABNORMALITY AT L3-4 OR ABOVE.  THE DISKS SHOW MINIMAL DEGENERATION BUT THERE IS NO HERNIATION.  THE CANAL AND FORAMINA ARE WIDELY PATENT.  THERE MAY BE MILD FACET DEGENERATION AT L3- 4. AT L4-5, THE PATIENT HAS HAD BOTH HEMILAMINECTOMY AND PARTIAL FACETECTOMY FOR RESECTION OF A LEFT FORAMINAL TO EXTRAFORAMINAL DISK HERNIATION.  THE DISK IS SEEN TO BE DEGENERATED AND PROTRUDES MODERATELY IN A DIFFUSE FASHION.  THERE IS ENHANCING MATERIAL WITHIN THE FORAMINAL AND EXTRAFORAMINAL REGION ON THE LEFT CONSISTENT WITH SURGERY IN THIS AREA.  THERE IS SOME MASS EFFECT UPON THE LEFT SIDE OF THE THECAL SAC. AT L5-S1, THE DISK APPEARS NORMAL.  THERE IS MILD FACET DEGENERATION. IMPRESSION AT THE OPERATIVE LEVEL OF L4-5, THERE IS BROAD BASED PROTRUSION OF DISK MATERIAL.  THERE IS ENHANCING MATERIAL WITHIN THE FORAMINAL TO EXTRAFORAMINAL REGION ON THE LEFT AT L4-5 CONSISTENT WITH FIBROSIS.  IT IS DIFFICULT TO BE SURE THAT THERE IS NOT SOME ENHANCING DISK MATERIAL IN THIS AREA BUT I THINK FIBROSIS IS MORE LIKELY.  CERTAINLY THIS WOULD SURROUND THE LEFT L4 NERVE ROOT. ALL OF THESE FACTORS DO COMBINE TO RESULT IN SOME MASS EFFECT UPON THE LEFT SIDE OF THE THECAL SAC.  THE LEFT HEMILAMINECTOMY HAS AFFORDED DECOMPRESSION OF  THE CENTRAL CANAL, HOWEVER.   Lumbar CT w/wo contrast:  Results for orders placed in visit on 09/10/98  CT L Spine Ltd Wo Or W/ Cm   Narrative FINDINGS CLINICAL DATA:  HNP AT L4-5, LEFT. LUMBAR DISCOGRAPHY AND POST-DISCOGRAM CT: THE PATIENT WAS GIVEN INFORMED CONSENT INCLUDING THE POSSIBILITY OF DISC SPACE INFECTION.  1 GRAM OF ANCEF WAS GIVEN IV.  A SMALL ALIQUOT OF ANCEF WAS ALSO ADDED TO THE CONTRAST. THE PATIENT WAS ALSO GIVEN INFORMED CONSENT FOR INCREASED PAIN AND NEUROLOGIC DEFICIT POST-PROCEDURE. THE PATIENT AGREED TO PROCEED AND WAS PERSONALLY EXPLAINED THESE RISKS AND BENEFITS BY MYSELF. THE BACK WAS SCRUBBED FOR FIVE MINUTES WITH A BETADINE SCRUB SPONGE. THE BACK WAS AGAIN PAINTED WITH BETADINE OINTMENT AND STERILE DRAPES WERE APPLIED.  LOCAL ANESTHESIA WAS PROVIDED WITH 1 PERCENT LIDOCAINE AND THE DISC SPACE ENTERED USING A 15 CM, 22 GAUGE CHIBA NEEDLE.  THE RESULTS ARE AS FOLLOWS: L4-5:  OPENING PRESSURE - 20 PSI.  PRESSURE AT PAIN RESPONSE - 40 PSI.  TOTAL  VOLUME - 3 ML. INTENSITY OF PAIN - 7 OUT OF 10. QUALITY OF THE PAIN WAS DESCRIBED BY THE PATIENT AS CENTRAL AND TO THE LEFT.  HE DID NOT DESCRIBE THE EXACT LEFT LEG PAIN WHICH HE USUALLY HAS UNTIL AFTER ABOUT TWO HOURS ON THE FLOOR IN SHORT STAY WHEN HE SAID HE DEVELOPED PAIN WHICH HE TYPICALLY HAS AT HOME OR WHILE WORKING EXCEPT IT WAS SLIGHTLY MORE SEVERE. POST-DISCOGRAM CT: ONLY THE L4-5 LEVEL WAS EXAMINED. L4-5:  THERE IS A POSTEROLATERAL ANNULAR RENT ON THE LEFT WITH CONTRAST EXTRUSION INTO THE FORAMEN. THIS CORRESPONDS TO THE OBSERVED DISC PROTRUSION ON MRI.  LEFT L-4 NERVE ROOT ENCROACHMENT IS OBSERVED. IMPRESSION   Lumbar CT w contrast:  Results for orders placed in visit on 07/13/00  CT Lumbar Spine W Contrast   Narrative FINDINGS CLINICAL DATA:  BACK AND LEFT LEG PAIN. LUMBAR DISCOGRAPHY: THE PATIENT WAS GIVEN INFORMED CONSENT INCLUDING THE RISKS OF INFECTION, PAIN, AND INCREASED NEUROLOGIC DEFICIT. THE  POSSIBILITY OF INFECTION WAS FURTHER DISCUSSED WITH THE PATIENT WITH REGARD TO DISCITIS AND OSTEOMYELITIS, WHICH IF THAT OCCURRED, WOULD REQUIRE INTRAVENOUS ANTIBIOTICS. THE PATIENT UNDERSTOOD AND AGREED TO PROCEED. THE PATIENT WAS GIVEN 1 GRAM OF ANCEF IV PRIOR TO THE PROCEDURE. THE BACK WAS SCRUBBED WITH A BETADINE SCRUB SPONGE FOR FIVE MINUTES FOLLOWED BY AN APPLICATION OF BETADINE SOLUTION. STERILE DRAPES WERE APPLIED. MASK AND CAP WERE USED BY EVERYONE IN THE ROOM. [place] AT L3-4 AND L4-5 FROM A LEFT PARASPINOUS APPROACH. THERE WAS NO CLEAR CUT PARASPINOUS APPROACH AVAILABLE TO L5-S1 AND THEREFORE, A TRANSDURAL APPROACH WAS USED. RESULTS WERE AS FOLLOWS: L3-4:  OPENING PRESSURE 8. PRESSURE AT PAIN RESPONSE WAS 40 PSI.  QUANTITY OF CONTRAST: 1.5 ML; LEVEL OF PAIN WAS NEAR ZERO.  LOCATION OF PAIN WAS MILD BACK PRESSURE. NO REAL PAIN. L4-5:  OPENING PRESSURE WAS 10 PSI. PRESSURE AT PAIN RESPONSE WAS 10 PSI. QUANTITY OF CONTRAST:  2 ML, LEVEL OF PAIN WAS 4 TO 5 ON A SCALE OF 1 TO 10, WITH 10 BEING MOST SEVERE.  THERE WAS PAIN ACROSS THE WHOLE BACK WITH SOME EXTENSION INTO THE LEFT HIP. THIS APPEARED TO BE CONCORDANT WITH THE PATIENT'S PAIN AT HOME. L5-S1:  OPENING PRESSURE WAS 20 PSI.  PRESSURE AT PAIN RESPONSE WAS 40 PSI. QUANTITY OF CONTRAST: 1.5 ML, LEVEL OF PAIN WAS 4 ON A SCALE OF 1 TO 10.  HE DID HAVE SOME DISCOMFORT IN THE CENTER OF HIS BACK BUT IT WAS DIFFERENT THAN THE KIND OF PAIN WHICH HE EXPERIENCED AT HOME. IT WAS MORE OF A PRESSURE SENSATION THAN TRUE PAIN. POST-DISCOGRAM CT: THE LOWER THREE DISC SPACES WERE EXAMINED. L3-4:  NORMAL INTERSPACE. L4-5:  PREVIOUS SURGERY HAS BEEN PERFORMED ON THE LEFT. PARTIAL FACETECTOMY HAS ALSO BEEN PERFORMED AT THIS LEVEL. THERE IS AN ANNULAR RENT WITH SUBLIGAMENTOUS EXTRUSION OF CONTRAST ON THE LEFT, CORRELATING WITH THE PATIENT'S POSITIVE DISCOGRAM AT THIS LEVEL. L5-S1:  NORMAL INTERSPACE. A NEEDLE TRACT THROUGH  WHICH THE TRANSDURAL APPROACH WAS ACCOMPLISHED IS SEEN, BUT DOES NOT REPRESENT AN ANNULAR TEAR. IMPRESSION ABNORMAL LUMBAR DISCOGRAPHY AT L4-5 WITH CONCORDANT REDUCTION OF THE PATIENT'S BACK PAIN AS WELL AS CT SHOWING POST-SURGICAL CHANGES AT L4-5 ON THE LEFT, CORRELATING WITH AN ANNULAR RENT.  SEE REPORT. L3-4 AND L5-S1 APPEAR TO BE RELATIVELY NORMAL INTERSPACES.  SEE REPORT.   Lumbar DG (Complete) 4+V:  Results for orders placed in visit on 06/24/00  DG Lumbar Spine Complete   Narrative FINDINGS HISTORY:  L4-5  LATERAL DISK.  LOW BACK PAIN. LUMBAR SPINE SERIES: THERE ARE FIVE LUMBAR TYPE VERTEBRAL BODIES WITH MILD SCOLIOSIS CONVEX TO THE LEFT.  THERE IS DISK SPACE NARROWING AT L4-5 AND, TO A LESSER EXTENT, AT L5-S1.  THERE HAS BEEN LEFT HEMILAMINECTOMY AT L4-5.  THERE IS FACET DEGENERATION BILATERALLY AT L3-4 AND L5-S1.  I THINK THERE HAS BEEN PARTIAL FACETECTOMY ON THE LEFT AT L4-5. IMPRESSION AS ABOVE. MRI OF THE LUMBAR SPINE: MULTIPLANAR T1 AND T2-WEIGHTED IMAGING WAS PERFORMED INCLUDING IMAGING AFTER INTRAVENOUS ADMINISTRATION OF OMNISCAN. COMPARISON IS MADE TO A PREOPERATIVE STUDY PERFORMED AT [facility] ON 08/23/98. THERE IS NO ABNORMALITY AT L3-4 OR ABOVE.  THE DISKS SHOW MINIMAL DEGENERATION BUT THERE IS NO HERNIATION.  THE CANAL AND FORAMINA ARE WIDELY PATENT.  THERE MAY BE MILD FACET DEGENERATION AT L3- 4. AT L4-5, THE PATIENT HAS HAD BOTH HEMILAMINECTOMY AND PARTIAL FACETECTOMY FOR RESECTION OF A LEFT FORAMINAL TO EXTRAFORAMINAL DISK HERNIATION.  THE DISK IS SEEN TO BE DEGENERATED AND PROTRUDES MODERATELY IN A DIFFUSE FASHION.  THERE IS ENHANCING MATERIAL WITHIN THE FORAMINAL AND EXTRAFORAMINAL REGION ON THE LEFT CONSISTENT WITH SURGERY IN THIS AREA.  THERE IS SOME MASS EFFECT UPON THE LEFT SIDE OF THE THECAL SAC. AT L5-S1, THE DISK APPEARS NORMAL.  THERE IS MILD FACET DEGENERATION. IMPRESSION AT THE OPERATIVE LEVEL OF L4-5, THERE IS BROAD BASED PROTRUSION  OF DISK MATERIAL.  THERE IS ENHANCING MATERIAL WITHIN THE FORAMINAL TO EXTRAFORAMINAL REGION ON THE LEFT AT L4-5 CONSISTENT WITH FIBROSIS.  IT IS DIFFICULT TO BE SURE THAT THERE IS NOT SOME ENHANCING DISK MATERIAL IN THIS AREA BUT I THINK FIBROSIS IS MORE LIKELY.  CERTAINLY THIS WOULD SURROUND THE LEFT L4 NERVE ROOT. ALL OF THESE FACTORS DO COMBINE TO RESULT IN SOME MASS EFFECT UPON THE LEFT SIDE OF THE THECAL SAC.  THE LEFT HEMILAMINECTOMY HAS AFFORDED DECOMPRESSION OF THE CENTRAL CANAL, HOWEVER.   Knee Imaging: Knee-L MR w contrast:  Results for orders placed in visit on 07/31/06  MR Knee Left  Wo Contrast   Narrative * PRIOR REPORT IMPORTED FROM AN EXTERNAL SYSTEM *   PRIOR REPORT IMPORTED FROM THE [employer] WORKFLOW SYSTEM   REASON FOR EXAM:    Left knee pain  COMMENTS:   PROCEDURE:     MR  - MR KNEE LT  WO CONTRAST  - Jul 31 2006 12:13PM   RESULT:   HISTORY: LEFT knee pain.   COMPARISON STUDIES: No recent.   PROCEDURE AND FINDINGS: Multiplanar/multisequence imaging of the LEFT knee  is obtained.  The      quadriceps and patellar tendons are intact.  The  cruciate ligaments are intact.  The medial and lateral menisci are intact.  No bony abnormalities are identified.   IMPRESSION:   1)No acute or focal abnormalities are identified.   Thank you for the opportunity to contribute to the care of your patient.       Note: Imaging results reviewed.  Meds  The patient has a current medication list which includes the following prescription(s): atenolol, atorvastatin, clonazepam, clonazepam, diphenhydramine-acetaminophen, esomeprazole, furosemide, lisinopril, magnesium oxide, metformin, multiple vitamins-minerals, omega-3 acid ethyl esters, pregabalin, sitagliptin, tizanidine, vitamin b-12, aspirin, biotin, vitamin d3, morphine, morphine, and morphine.  Current Outpatient Prescriptions on File Prior to Visit  Medication Sig  . atenolol (TENORMIN) 100 MG tablet Take 1 tablet (100  mg total) by mouth daily.  Marland Kitchen atorvastatin (LIPITOR) 20 MG tablet Take 1 tablet (20 mg total) by mouth daily at 6 PM.  .  clonazePAM (KLONOPIN) 0.5 MG tablet Take 1 tablet (0.5 mg total) by mouth at bedtime as needed for anxiety.  . clonazePAM (KLONOPIN) 1 MG tablet Take 1 tablet (1 mg total) by mouth 2 (two) times daily as needed.  . diphenhydramine-acetaminophen (TYLENOL PM) 25-500 MG TABS tablet Take 1 tablet by mouth at bedtime as needed.  Marland Kitchen esomeprazole (NEXIUM) 40 MG capsule Take 1 capsule (40 mg total) by mouth daily.  . furosemide (LASIX) 40 MG tablet Take 1 tablet (40 mg total) by mouth daily.  Marland Kitchen lisinopril (PRINIVIL,ZESTRIL) 2.5 MG tablet Take 1 tablet (2.5 mg total) by mouth daily.  . Magnesium Oxide 500 MG CAPS Take 1 capsule (500 mg total) by mouth daily.  . metFORMIN (GLUCOPHAGE) 1000 MG tablet Take 1 tablet (1,000 mg total) by mouth 2 (two) times daily.  . Multiple Vitamins-Minerals (SENTRY SENIOR PO) Take 1 tablet by mouth.  . omega-3 acid ethyl esters (LOVAZA) 1 g capsule Take 2 capsules (2 g total) by mouth 2 (two) times daily.  . pregabalin (LYRICA) 50 MG capsule Take 1 capsule (50 mg total) by mouth 3 (three) times daily.  . sitaGLIPtin (JANUVIA) 50 MG tablet Take 1 tablet (50 mg total) by mouth daily.  Marland Kitchen tiZANidine (ZANAFLEX) 4 MG tablet Take 1 tablet (4 mg total) by mouth daily.  . vitamin B-12 (CYANOCOBALAMIN) 500 MCG tablet Take 500 mcg by mouth daily.  Marland Kitchen aspirin 81 MG tablet Take 81 mg by mouth daily.  . Biotin 5000 MCG CAPS Take 5,000 capsules by mouth 2 (two) times daily.  . Cholecalciferol (VITAMIN D3) 2000 units TABS Take 2,000 tablets by mouth daily.   No current facility-administered medications on file prior to visit.     ROS  Constitutional: Denies any fever or chills Gastrointestinal: No reported hemesis, hematochezia, vomiting, or acute GI distress Musculoskeletal: Denies any acute onset joint swelling, redness, loss of ROM, or weakness Neurological: No  reported episodes of acute onset apraxia, aphasia, dysarthria, agnosia, amnesia, paralysis, loss of coordination, or loss of consciousness  Allergies  Mr. Heineman has No Known Allergies.  [place]  Medical:  Mr. Shippen  has a past medical history of Anxiety; Chronic LBP (09/13/2015); Chronic left hip pain (09/27/2015); Diabetes mellitus without complication ([place]); GERD (gastroesophageal reflux disease); Hyperlipidemia; Hyperlipidemia; Hypertension; and Hypomagnesemia. Family: family history includes Alcohol abuse in his father; Cancer in his maternal grandfather; Colon polyps in his mother; Diabetes in his paternal grandmother; Retinitis pigmentosa in his maternal grandmother and mother. Surgical:  has a past surgical history that includes Laminectomy (2001); Cataract extraction (1992); and Wrist surgery (Right). Tobacco:  reports that he has quit smoking. His smoking use included Cigarettes. He has never used smokeless tobacco. Alcohol:  reports that he drinks alcohol. Drug:  reports that he does not use drugs.  Constitutional Exam  Vitals: Blood pressure (!) 160/85, pulse (!) 57, temperature 98.9 F (37.2 C), resp. rate 20, height 5\' 9"  (1.753 m), weight 250 lb (113.4 kg), SpO2 100 %. General appearance: Well nourished, well developed, and well hydrated. In no acute distress Calculated BMI/Body habitus: Body mass index is 36.92 kg/m. (35-39.9 kg/m2) Severe obesity (Class II) - 136% higher incidence of chronic pain Psych/Mental status: Alert and oriented x 3 (person, place, & time) Eyes: PERLA Respiratory: No evidence of acute respiratory distress  Cervical Spine Exam  Inspection: No masses, redness, or swelling Alignment: Symmetrical Functional ROM: ROM appears unrestricted Stability: No instability detected Muscle strength & Tone: Functionally intact Sensory: Unimpaired Palpation: Non-contributory  Upper Extremity (UE) Exam    Side: Right upper extremity  Side: Left upper extremity   Inspection: No masses, redness, swelling, or asymmetry  Inspection: No masses, redness, swelling, or asymmetry  Functional ROM: ROM appears unrestricted  Functional ROM: ROM appears unrestricted  Muscle strength & Tone: Functionally intact  Muscle strength & Tone: Functionally intact  Sensory: Unimpaired  Sensory: Unimpaired  Palpation: Non-contributory  Palpation: Non-contributory   Thoracic Spine Exam  Inspection: No masses, redness, or swelling Alignment: Symmetrical Functional ROM: ROM appears unrestricted Stability: No instability detected Sensory: Unimpaired Muscle strength & Tone: Functionally intact Palpation: Non-contributory  Lumbar Spine Exam  Inspection: No masses, redness, or swelling Alignment: Symmetrical Functional ROM: ROM appears unrestricted Stability: No instability detected Muscle strength & Tone: Functionally intact Sensory: Unimpaired Palpation: Non-contributory Provocative Tests: Lumbar Hyperextension and rotation test: evaluation deferred today       Patrick's Maneuver: evaluation deferred today              Gait & Posture Assessment  Ambulation: Unassisted Gait: Relatively normal for age and body habitus Posture: WNL   Lower Extremity Exam    Side: Right lower extremity  Side: Left lower extremity  Inspection: No masses, redness, swelling, or asymmetry  Inspection: No masses, redness, swelling, or asymmetry  Functional ROM: ROM appears unrestricted  Functional ROM: ROM appears unrestricted  Muscle strength & Tone: Functionally intact  Muscle strength & Tone: Functionally intact  Sensory: Unimpaired  Sensory: Unimpaired  Palpation: Non-contributory  Palpation: Non-contributory    Assessment & Plan  Primary Diagnosis & Pertinent Problem List: The primary encounter diagnosis was Chronic pain. Diagnoses of Long term current use of opiate analgesic, Opiate use (30 MME/Day), Encounter for chronic pain management, Chronic low back pain (Location of  Primary Source of Pain) (Bilateral) (L>R), Chronic hip pain, unspecified laterality, and Post Laminectomy Syndrome (L4-5 Hemilaminectomy and partial Facetectomy) were also pertinent to this visit.  Visit Diagnosis: 1. Chronic pain   2. Long term current use of opiate analgesic   3. Opiate use (30 MME/Day)   4. Encounter for chronic pain management   5. Chronic low back pain (Location of Primary Source of Pain) (Bilateral) (L>R)   6. Chronic hip pain, unspecified laterality   7. Post Laminectomy Syndrome (L4-5 Hemilaminectomy and partial Facetectomy)     Problems updated and reviewed during this visit: No problems updated.  Problem-specific Plan(s): No problem-specific Assessment & Plan notes found for this encounter.  No new Assessment & Plan notes have been filed under this hospital service since the last note was generated. 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)   Relevant Medications   morphine (MS CONTIN) 15 MG 12 hr tablet   morphine (MS CONTIN) 15 MG 12 hr tablet   morphine (MS CONTIN) 15 MG 12 hr tablet   Chronic low back pain (Location of Primary Source of Pain) (Bilateral) (L>R) (Chronic)   Relevant Medications   morphine (MS CONTIN) 15 MG 12 hr tablet   morphine (MS CONTIN) 15 MG 12 hr tablet   morphine (MS CONTIN) 15 MG 12 hr tablet   Chronic pain - Primary (Chronic)   Relevant Medications   morphine (MS CONTIN) 15 MG 12 hr tablet   morphine (MS CONTIN) 15 MG 12 hr tablet   morphine (MS CONTIN) 15  MG 12 hr tablet   Post Laminectomy Syndrome (L4-5 Hemilaminectomy and partial Facetectomy) (Chronic)     Medium   Encounter for chronic pain management   Long term current use of opiate analgesic (Chronic)   Relevant Orders   ToxASSURE Select 13 (MW), Urine   Opiate use (30 MME/Day) (Chronic)     Other Visit Diagnoses   None.      Pharmacotherapy (Medications Ordered): Meds ordered this encounter  Medications  . morphine (MS CONTIN) 15 MG 12 hr tablet    Sig: Take 1 tablet (15 mg total) by mouth every 12 (twelve) hours.    Dispense:  60 tablet    Refill:  0    Do not place this medication, or any other prescription from our practice, on "Automatic Refill". Patient may have prescription filled one day early if pharmacy is closed on scheduled refill date. Do not fill until: 06/24/16 To last until: 07/24/16  . morphine (MS CONTIN) 15 MG 12 hr tablet    Sig: Take 1 tablet (15 mg total) by mouth every 12 (twelve) hours.    Dispense:  60 tablet    Refill:  0    Do not place this medication, or any other prescription from our practice, on "Automatic Refill". Patient may have prescription filled one day early if pharmacy is closed on scheduled refill date. Do not fill until: 07/24/16 To last until: 08/23/16  . morphine (MS CONTIN) 15 MG 12 hr tablet    Sig: Take 1 tablet (15 mg total) by mouth every 12 (twelve) hours.    Dispense:  60 tablet    Refill:  0    Do not place this medication, or any other prescription from our practice, on "Automatic Refill". Patient may have prescription filled one day early if pharmacy is closed on scheduled refill date. Do not fill until: 08/23/16 To last until: 09/22/16    [facility] Procedure Ordered: Orders Placed This Encounter  Procedures  . ToxASSURE Select 13 (MW), Urine    Imaging Ordered: None  Interventional Therapies: Scheduled:  None at this time    Considering:  1. Diagnostic bilateral lumbar facet block under fluoroscopic guidance and IV sedation. 2. Possible spinal cord stimulator trial.   PRN Procedures:  1. Diagnostic bilateral lumbar facet block under fluoroscopic guidance and IV sedation.   Referral(s) or Consult(s): None at this time.  New Prescriptions   No medications on file    Medications administered  during this visit: Mr. Miro had no medications administered during this visit.  Requested PM Follow-up: Return in 3 months (on 09/06/2016) for (3-Mo) Med-Mgmt.  Future Appointments Date Time Provider [place]  06/09/2016 10:20 AM Roselee Nova, MD [place] None  09/06/2016 8:20 AM Milinda Pointer, MD [facility] None    Primary Care Physician: Keith Rake, MD Location: [facility] Note by: Kathlen Brunswick. Dossie Arbour, M.D, DABA, DABAPM, DABPM, DABIPP, FIPP  Pain Score Disclaimer: We use the NRS-11 scale. This is a self-reported, subjective measurement of pain severity with only modest accuracy. It is used primarily to identify changes within a particular patient. It must be understood that outpatient pain scales are significantly less accurate that those used for research, where they can be applied under ideal controlled circumstances with minimal exposure to variables. In reality, the score is likely to be a combination of pain intensity and pain affect, where pain affect describes the degree of emotional arousal or changes in action readiness caused by the  sensory experience of pain. Factors such as social and work situation, setting, emotional state, anxiety levels, expectation, and prior pain experience may influence pain perception and show large inter-individual differences that may also be affected by time variables.  Patient instructions provided during this appointment: There are no Patient Instructions on file for this visit.

## 2016-06-07 ENCOUNTER — Ambulatory Visit: Payer: Medicare Other | Attending: Pain Medicine | Admitting: Pain Medicine

## 2016-06-07 ENCOUNTER — Encounter: Payer: Self-pay | Admitting: Pain Medicine

## 2016-06-07 VITALS — BP 160/85 | HR 57 | Temp 98.9°F | Resp 20 | Ht 69.0 in | Wt 250.0 lb

## 2016-06-07 DIAGNOSIS — Z7982 Long term (current) use of aspirin: Secondary | ICD-10-CM | POA: Insufficient documentation

## 2016-06-07 DIAGNOSIS — I10 Essential (primary) hypertension: Secondary | ICD-10-CM | POA: Diagnosis not present

## 2016-06-07 DIAGNOSIS — F1190 Opioid use, unspecified, uncomplicated: Secondary | ICD-10-CM | POA: Diagnosis not present

## 2016-06-07 DIAGNOSIS — E1140 Type 2 diabetes mellitus with diabetic neuropathy, unspecified: Secondary | ICD-10-CM | POA: Diagnosis not present

## 2016-06-07 DIAGNOSIS — M109 Gout, unspecified: Secondary | ICD-10-CM | POA: Insufficient documentation

## 2016-06-07 DIAGNOSIS — H3552 Pigmentary retinal dystrophy: Secondary | ICD-10-CM | POA: Diagnosis not present

## 2016-06-07 DIAGNOSIS — G4700 Insomnia, unspecified: Secondary | ICD-10-CM | POA: Diagnosis not present

## 2016-06-07 DIAGNOSIS — M545 Low back pain, unspecified: Secondary | ICD-10-CM

## 2016-06-07 DIAGNOSIS — Z86010 Personal history of colonic polyps: Secondary | ICD-10-CM | POA: Diagnosis not present

## 2016-06-07 DIAGNOSIS — E781 Pure hyperglyceridemia: Secondary | ICD-10-CM | POA: Insufficient documentation

## 2016-06-07 DIAGNOSIS — R12 Heartburn: Secondary | ICD-10-CM | POA: Insufficient documentation

## 2016-06-07 DIAGNOSIS — E785 Hyperlipidemia, unspecified: Secondary | ICD-10-CM | POA: Insufficient documentation

## 2016-06-07 DIAGNOSIS — E8881 Metabolic syndrome: Secondary | ICD-10-CM | POA: Insufficient documentation

## 2016-06-07 DIAGNOSIS — M4806 Spinal stenosis, lumbar region: Secondary | ICD-10-CM | POA: Diagnosis not present

## 2016-06-07 DIAGNOSIS — M25562 Pain in left knee: Secondary | ICD-10-CM | POA: Diagnosis not present

## 2016-06-07 DIAGNOSIS — F419 Anxiety disorder, unspecified: Secondary | ICD-10-CM | POA: Insufficient documentation

## 2016-06-07 DIAGNOSIS — Z9889 Other specified postprocedural states: Secondary | ICD-10-CM | POA: Diagnosis not present

## 2016-06-07 DIAGNOSIS — Z79891 Long term (current) use of opiate analgesic: Secondary | ICD-10-CM | POA: Diagnosis not present

## 2016-06-07 DIAGNOSIS — G8929 Other chronic pain: Secondary | ICD-10-CM

## 2016-06-07 DIAGNOSIS — Z7984 Long term (current) use of oral hypoglycemic drugs: Secondary | ICD-10-CM | POA: Insufficient documentation

## 2016-06-07 DIAGNOSIS — M961 Postlaminectomy syndrome, not elsewhere classified: Secondary | ICD-10-CM | POA: Insufficient documentation

## 2016-06-07 DIAGNOSIS — M5126 Other intervertebral disc displacement, lumbar region: Secondary | ICD-10-CM | POA: Insufficient documentation

## 2016-06-07 DIAGNOSIS — R609 Edema, unspecified: Secondary | ICD-10-CM | POA: Insufficient documentation

## 2016-06-07 DIAGNOSIS — E669 Obesity, unspecified: Secondary | ICD-10-CM | POA: Insufficient documentation

## 2016-06-07 DIAGNOSIS — M25559 Pain in unspecified hip: Secondary | ICD-10-CM | POA: Diagnosis not present

## 2016-06-07 DIAGNOSIS — Z7189 Other specified counseling: Secondary | ICD-10-CM | POA: Diagnosis not present

## 2016-06-07 DIAGNOSIS — Z87891 Personal history of nicotine dependence: Secondary | ICD-10-CM | POA: Insufficient documentation

## 2016-06-07 DIAGNOSIS — G4733 Obstructive sleep apnea (adult) (pediatric): Secondary | ICD-10-CM | POA: Diagnosis not present

## 2016-06-07 DIAGNOSIS — Z6839 Body mass index (BMI) 39.0-39.9, adult: Secondary | ICD-10-CM | POA: Insufficient documentation

## 2016-06-07 MED ORDER — MORPHINE SULFATE ER 15 MG PO TBCR: 15.0000 mg | EXTENDED_RELEASE_TABLET | Freq: Two times a day (BID) | ORAL | 0 refills | Status: DC

## 2016-06-07 NOTE — Progress Notes (Signed)
Safety precautions to be maintained throughout the outpatient stay will include: orient to surroundings, keep bed in low position, maintain call bell within reach at all times, provide assistance with transfer out of bed and ambulation.  Bottle labeled morphine sulfate 15 mg #33/60  Filled 05-25-16

## 2016-06-09 ENCOUNTER — Encounter: Payer: Self-pay | Admitting: Family Medicine

## 2016-06-09 ENCOUNTER — Ambulatory Visit (INDEPENDENT_AMBULATORY_CARE_PROVIDER_SITE_OTHER): Payer: Medicare Other | Admitting: Family Medicine

## 2016-06-09 DIAGNOSIS — M5416 Radiculopathy, lumbar region: Secondary | ICD-10-CM

## 2016-06-09 DIAGNOSIS — G4701 Insomnia due to medical condition: Secondary | ICD-10-CM | POA: Diagnosis not present

## 2016-06-09 DIAGNOSIS — K219 Gastro-esophageal reflux disease without esophagitis: Secondary | ICD-10-CM

## 2016-06-09 DIAGNOSIS — E785 Hyperlipidemia, unspecified: Secondary | ICD-10-CM | POA: Diagnosis not present

## 2016-06-09 DIAGNOSIS — F411 Generalized anxiety disorder: Secondary | ICD-10-CM | POA: Diagnosis not present

## 2016-06-09 DIAGNOSIS — E781 Pure hyperglyceridemia: Secondary | ICD-10-CM | POA: Diagnosis not present

## 2016-06-09 DIAGNOSIS — G8929 Other chronic pain: Secondary | ICD-10-CM | POA: Diagnosis not present

## 2016-06-09 DIAGNOSIS — E1140 Type 2 diabetes mellitus with diabetic neuropathy, unspecified: Secondary | ICD-10-CM | POA: Diagnosis not present

## 2016-06-09 DIAGNOSIS — I10 Essential (primary) hypertension: Secondary | ICD-10-CM

## 2016-06-09 MED ORDER — PREGABALIN 50 MG PO CAPS: 50.0000 mg | ORAL_CAPSULE | Freq: Three times a day (TID) | ORAL | 0 refills | Status: DC

## 2016-06-09 MED ORDER — METFORMIN HCL 1000 MG PO TABS: 1000.0000 mg | ORAL_TABLET | Freq: Two times a day (BID) | ORAL | 1 refills | Status: DC

## 2016-06-09 MED ORDER — SITAGLIPTIN PHOSPHATE 50 MG PO TABS: 50.0000 mg | ORAL_TABLET | Freq: Every day | ORAL | 0 refills | Status: DC

## 2016-06-09 MED ORDER — CLONAZEPAM 0.5 MG PO TABS: 0.5000 mg | ORAL_TABLET | Freq: Every evening | ORAL | 0 refills | Status: DC | PRN

## 2016-06-09 MED ORDER — OMEGA-3-ACID ETHYL ESTERS 1 G PO CAPS: 2.0000 g | ORAL_CAPSULE | Freq: Two times a day (BID) | ORAL | 1 refills | Status: DC

## 2016-06-09 MED ORDER — ATORVASTATIN CALCIUM 20 MG PO TABS: 20.0000 mg | ORAL_TABLET | Freq: Every day | ORAL | 1 refills | Status: DC

## 2016-06-09 MED ORDER — ATENOLOL 100 MG PO TABS: 100.0000 mg | ORAL_TABLET | Freq: Every day | ORAL | 1 refills | Status: DC

## 2016-06-09 MED ORDER — TIZANIDINE HCL 4 MG PO TABS
4.0000 mg | ORAL_TABLET | Freq: Every day | ORAL | 1 refills | Status: DC
Start: 2016-06-09 — End: 2016-12-07

## 2016-06-09 MED ORDER — LISINOPRIL 2.5 MG PO TABS
2.5000 mg | ORAL_TABLET | Freq: Every day | ORAL | 1 refills | Status: DC
Start: 2016-06-09 — End: 2016-12-07

## 2016-06-09 MED ORDER — ESOMEPRAZOLE MAGNESIUM 40 MG PO CPDR: 40.0000 mg | DELAYED_RELEASE_CAPSULE | Freq: Every day | ORAL | 1 refills | Status: DC

## 2016-06-09 MED ORDER — CLONAZEPAM 1 MG PO TABS: 1.0000 mg | ORAL_TABLET | Freq: Two times a day (BID) | ORAL | 0 refills | Status: DC | PRN

## 2016-06-09 NOTE — Progress Notes (Signed)
Name: Joel Bryant   MRN: [id number]    DOB: [birth year]   Date:06/09/2016       Progress Note  Subjective  Chief Complaint  Chief Complaint  Patient presents with  . Follow-up    3 mo  . Medication Refill    Diabetes  He presents for his follow-up diabetic visit. He has type 2 diabetes mellitus. His disease course has been improving. There are no diabetic associated symptoms. Pertinent negatives for diabetes include no chest pain, no fatigue, no polydipsia and no polyuria. Symptoms are improving. Diabetic complications include peripheral neuropathy (Has been on Lyrica in the past which helped, requestign to be restarted on Lyrica.). Current diabetic treatment includes oral agent (dual therapy). He is following a diabetic diet. His breakfast blood glucose range is generally 110-130 mg/dl.  Anxiety  Presents for follow-up visit. The problem has been unchanged. Symptoms include excessive worry, insomnia and irritability. Patient reports no chest pain or shortness of breath. The severity of symptoms is moderate and causing significant distress.   Past treatments include benzodiazephines. The treatment provided significant relief. Compliance with prior treatments has been good.  Hypertension  This is a chronic problem. The problem is unchanged. The problem is controlled. Associated symptoms include anxiety. Pertinent negatives include no chest pain or shortness of breath. Past treatments include beta blockers and ACE inhibitors.     Past Medical History:  Diagnosis Date  . Anxiety   . Chronic LBP 09/13/2015  . Chronic left hip pain 09/27/2015  . Diabetes mellitus without complication ([place])   . GERD (gastroesophageal reflux disease)   . Hyperlipidemia   . Hyperlipidemia   . Hypertension   . Hypomagnesemia     Past Surgical History:  Procedure Laterality Date  . CATARACT EXTRACTION  1992   Insert Prosthetic Lens  . LAMINECTOMY  2001   lumbar  . WRIST SURGERY Right    x2     Family History  Problem Relation Age of Onset  . Colon polyps Mother   . Retinitis pigmentosa Mother   . Retinitis pigmentosa Maternal Grandmother   . Cancer Maternal Grandfather   . Diabetes Paternal Grandmother   . Alcohol abuse Father     Social History   Social History  . Marital status: Divorced    Spouse name: N/A  . Number of children: N/A  . Years of education: N/A   Occupational History  . Not on file.   Social History Main Topics  . Smoking status: Former Smoker    Types: Cigarettes  . Smokeless tobacco: Never Used     Comment: Quit smoking 4 years now  . Alcohol use 0.0 oz/week     Comment: occassional/seldom  . Drug use: No  . Sexual activity: Not on file   Other Topics Concern  . Not on file   Social History Narrative  . No narrative on file     Current Outpatient Prescriptions:  .  aspirin 81 MG tablet, Take 81 mg by mouth daily., Disp: , Rfl:  .  atenolol (TENORMIN) 100 MG tablet, Take 1 tablet (100 mg total) by mouth daily., Disp: 90 tablet, Rfl: 1 .  atorvastatin (LIPITOR) 20 MG tablet, Take 1 tablet (20 mg total) by mouth daily at 6 PM., Disp: 90 tablet, Rfl: 1 .  Biotin 5000 MCG CAPS, Take 5,000 capsules by mouth 2 (two) times daily., Disp: , Rfl:  .  Cholecalciferol (VITAMIN D3) 2000 units TABS, Take 2,000 tablets by mouth daily., Disp: ,  Rfl:  .  clonazePAM (KLONOPIN) 0.5 MG tablet, Take 1 tablet (0.5 mg total) by mouth at bedtime as needed for anxiety., Disp: 90 tablet, Rfl: 0 .  clonazePAM (KLONOPIN) 1 MG tablet, Take 1 tablet (1 mg total) by mouth 2 (two) times daily as needed., Disp: 180 tablet, Rfl: 0 .  diphenhydramine-acetaminophen (TYLENOL PM) 25-500 MG TABS tablet, Take 1 tablet by mouth at bedtime as needed., Disp: , Rfl:  .  esomeprazole (NEXIUM) 40 MG capsule, Take 1 capsule (40 mg total) by mouth daily., Disp: 90 capsule, Rfl: 1 .  furosemide (LASIX) 40 MG tablet, Take 1 tablet (40 mg total) by mouth daily., Disp: 90 tablet,  Rfl: 0 .  lisinopril (PRINIVIL,ZESTRIL) 2.5 MG tablet, Take 1 tablet (2.5 mg total) by mouth daily., Disp: 90 tablet, Rfl: 1 .  Magnesium Oxide 500 MG CAPS, Take 1 capsule (500 mg total) by mouth daily., Disp: 100 capsule, Rfl: PRN .  metFORMIN (GLUCOPHAGE) 1000 MG tablet, Take 1 tablet (1,000 mg total) by mouth 2 (two) times daily., Disp: 180 tablet, Rfl: 1 .  morphine (MS CONTIN) 15 MG 12 hr tablet, Take 1 tablet (15 mg total) by mouth every 12 (twelve) hours., Disp: 60 tablet, Rfl: 0 .  morphine (MS CONTIN) 15 MG 12 hr tablet, Take 1 tablet (15 mg total) by mouth every 12 (twelve) hours., Disp: 60 tablet, Rfl: 0 .  morphine (MS CONTIN) 15 MG 12 hr tablet, Take 1 tablet (15 mg total) by mouth every 12 (twelve) hours., Disp: 60 tablet, Rfl: 0 .  Multiple Vitamins-Minerals (SENTRY SENIOR PO), Take 1 tablet by mouth., Disp: , Rfl:  .  omega-3 acid ethyl esters (LOVAZA) 1 g capsule, Take 2 capsules (2 g total) by mouth 2 (two) times daily., Disp: 360 capsule, Rfl: 1 .  pregabalin (LYRICA) 50 MG capsule, Take 1 capsule (50 mg total) by mouth 3 (three) times daily., Disp: 270 capsule, Rfl: 0 .  sitaGLIPtin (JANUVIA) 50 MG tablet, Take 1 tablet (50 mg total) by mouth daily., Disp: 90 tablet, Rfl: 0 .  tiZANidine (ZANAFLEX) 4 MG tablet, Take 1 tablet (4 mg total) by mouth daily., Disp: 90 tablet, Rfl: 1 .  vitamin B-12 (CYANOCOBALAMIN) 500 MCG tablet, Take 500 mcg by mouth daily., Disp: , Rfl:   No Known Allergies   Review of Systems  Constitutional: Positive for irritability. Negative for fatigue.  Respiratory: Negative for shortness of breath.   Cardiovascular: Negative for chest pain.  Endo/Heme/Allergies: Negative for polydipsia.  Psychiatric/Behavioral: The patient has insomnia.     Objective  Vitals:   06/09/16 1014  BP: 140/74  Pulse: 65  Resp: 17  Temp: 98 F (36.7 C)  TempSrc: Oral  SpO2: 97%  Weight: 249 lb 4.8 oz (113.1 kg)  Height: 5\' 9"  (1.753 m)    Physical Exam   Constitutional: He is oriented to person, place, and time and well-developed, well-nourished, and in no distress.  Cardiovascular: Normal rate and regular rhythm.   Pulmonary/Chest: Effort normal and breath sounds normal.  Musculoskeletal:       Right ankle: He exhibits no swelling. No tenderness.       Left ankle: He exhibits swelling. Tenderness.  Neurological: He is alert and oriented to person, place, and time.  Psychiatric: Mood, memory, affect and judgment normal.  Nursing note and vitals reviewed.   Assessment & Plan  1. Controlled type 2 diabetes mellitus with diabetic neuropathy, without long-term current use of insulin (HCC) Stable, refills on Januvia  provided. Patient to return in one week to obtain an A1c and glucose - pregabalin (LYRICA) 50 MG capsule; Take 1 capsule (50 mg total) by mouth 3 (three) times daily.  Dispense: 270 capsule; Refill: 0 - sitaGLIPtin (JANUVIA) 50 MG tablet; Take 1 tablet (50 mg total) by mouth daily.  Dispense: 90 tablet; Refill: 0  2. Dyslipidemia  - atorvastatin (LIPITOR) 20 MG tablet; Take 1 tablet (20 mg total) by mouth daily at 6 PM.  Dispense: 90 tablet; Refill: 1  3. Essential hypertension  - atenolol (TENORMIN) 100 MG tablet; Take 1 tablet (100 mg total) by mouth daily.  Dispense: 90 tablet; Refill: 1  4. Gastroesophageal reflux disease, esophagitis presence not specified  - esomeprazole (NEXIUM) 40 MG capsule; Take 1 capsule (40 mg total) by mouth daily.  Dispense: 90 capsule; Refill: 1  5. Generalized anxiety disorder Symptoms improved and responsive to clonazepam taken twice daily as needed - clonazePAM (KLONOPIN) 1 MG tablet; Take 1 tablet (1 mg total) by mouth 2 (two) times daily as needed.  Dispense: 180 tablet; Refill: 0  6. Hypertriglyceridemia  - omega-3 acid ethyl esters (LOVAZA) 1 g capsule; Take 2 capsules (2 g total) by mouth 2 (two) times daily.  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  Dispense: 90 tablet; Refill: 0  8. Lumbar radiculopathy Being followed by pain clinic. - tiZANidine (ZANAFLEX) 4 MG tablet; Take 1 tablet (4 mg total) by mouth daily.  Dispense: 90 tablet; Refill: 1    Ezequias Lard [facility] 06/09/2016 11:19 AM

## 2016-06-17 LAB — TOXASSURE SELECT 13 (MW), URINE: PDF: 0

## 2016-08-29 DIAGNOSIS — Z961 Presence of intraocular lens: Secondary | ICD-10-CM | POA: Diagnosis not present

## 2016-08-29 DIAGNOSIS — H3552 Pigmentary retinal dystrophy: Secondary | ICD-10-CM | POA: Diagnosis not present

## 2016-09-06 ENCOUNTER — Ambulatory Visit: Payer: Medicare Other | Admitting: Family Medicine

## 2016-09-06 ENCOUNTER — Telehealth: Payer: Self-pay | Admitting: Family Medicine

## 2016-09-06 ENCOUNTER — Other Ambulatory Visit: Payer: Self-pay | Admitting: Family Medicine

## 2016-09-06 ENCOUNTER — Encounter: Payer: Self-pay | Admitting: Pain Medicine

## 2016-09-06 DIAGNOSIS — G894 Chronic pain syndrome: Secondary | ICD-10-CM | POA: Insufficient documentation

## 2016-09-06 DIAGNOSIS — E1140 Type 2 diabetes mellitus with diabetic neuropathy, unspecified: Secondary | ICD-10-CM

## 2016-09-06 NOTE — Telephone Encounter (Signed)
Katie from [facility] is requesting refill on Januvia 50mg . #90 taking 1 tablet by mouth daily.  (P) [id number]

## 2016-09-06 NOTE — Progress Notes (Signed)
Patient's Name: Joel Bryant  MRN: [id number]  Referring Provider: Roselee Nova, MD  DOB: Jan 28, 1961  PCP: Roselee Nova, MD  DOS: 09/07/2016  Note by: Kathlen Brunswick. Dossie Arbour, MD  Service setting: Ambulatory outpatient  Specialty: Interventional Pain Management  Location: [facility] (AMB) Pain Management Facility    Patient type: Established   Primary Reason(s) for Visit: Encounter for prescription drug management (Level of risk: moderate) CC: Back Pain (lower) and Hip Pain (both )  HPI  Joel Bryant is a 55 y.o. year old, male patient, who comes today for a medication management evaluation. 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); Dietary counseling and surveillance; Long term current use of opiate analgesic; Long term prescription opiate use; Opiate use (30 MME/Day); Encounter for therapeutic drug level monitoring; Encounter for chronic pain management; Lumbar spondylosis; Failed back surgical syndrome; Epidural fibrosis; Chronic low back pain (Location of Primary Source of Pain) (Bilateral) (L>R); Hypomagnesemia; Encounter for long-term (current) use of other high-risk medications; Encounter for opiate analgesic use agreement; Opioid use agreement exists; Chronic left knee pain; Lumbar spinal stenosis (L4-5); 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 His primarily concern today is the Back Pain (lower) and Hip Pain (both )  Pain Assessment: Self-Reported Pain Score: 4 /10             Reported level is compatible with observation.       Pain Type: Chronic pain Pain Location: Back Pain Orientation: Lower Pain Descriptors / Indicators: Constant, Sharp Pain Frequency: Constant  Joel Bryant was last seen on 06/07/2016 for medication management. During today's appointment we reviewed Joel Bryant's chronic pain status, as well as his outpatient medication regimen.  The patient  reports that he does not use drugs. His body mass index is 35.44 kg/m.  Further details on both, my assessment(s), as well as the proposed treatment plan, please see below.  Controlled Substance Pharmacotherapy Assessment REMS (Risk Evaluation and Mitigation Strategy)  Analgesic:MS Contin (morphine ER) 15 mg every 12 hours (30 mg/day) MME/day:30 mg/day No notes on file Pharmacokinetics: Liberation and absorption (onset of action): WNL Distribution (time to peak effect): WNL Metabolism and excretion (duration of action): WNL         Pharmacodynamics: Desired effects: Analgesia: The patient reports >50% benefit. Reported improvement in function: The patient reports medication allows him to accomplish basic ADLs. Clinically meaningful improvement in function (CMIF): Sustained CMIF goals met Perceived effectiveness: Described as relatively effective, allowing for increase in activities of daily living (ADL) Undesirable effects: Side-effects or Adverse reactions: None reported Monitoring: [place] PMP: Online review of the past 26-monthperiod conducted. Compliant with practice rules and regulations List of all UDS test(s) done:  Lab Results  Component Value Date   TOXASSSELUR FINAL 06/07/2016   TOXASSSELUR FINAL 03/22/2016    TOXASSSELUR FINAL 12/27/2015   TOXASSSELUR FINAL 09/27/2015   Last UDS on record: ToxAssure Select 13  Date  Value Ref Range Status  06/07/2016 FINAL  Final    Comment:    ==================================================================== TOXASSURE SELECT 13 (MW) ==================================================================== Test                             Result       Flag       Units Drug Present and Declared for Prescription Verification   7-aminoclonazepam              119          EXPECTED   ng/mg creat    7-aminoclonazepam is an expected metabolite of clonazepam. Source    of clonazepam is a scheduled prescription medication.   Morphine                       9674         EXPECTED   ng/mg creat    Potential sources of large amounts of morphine in the absence of    codeine include administration of morphine or use of heroin. ==================================================================== Test                      Result    Flag   Units      Ref Range   Creatinine              43               mg/dL      >=20 ==================================================================== Declared Medications:  The flagging and interpretation on this report are based on the  following declared medications.  Unexpected results may arise from  inaccuracies in the declared medications.  **Note: The testing scope of this panel includes these medications:  Clonazepam  Morphine  **Note: The testing scope of this panel does not include following  reported medications:  Acetaminophen (Tylenol PM)  Aspirin  Atenolol  Atorvastatin (Lipitor)  Cyanocobalamin  Diphenhydramine (Tylenol PM)  Furosemide (Lasix)  Lisinopril  Magnesium Oxide  Metformin (Glucophage)  Multivitamin  Omega-3 Fatty Acids (Lovaza)  Omeprazole (Nexium)  Pregabalin (Lyrica)  Sitagliptin (Januvia)  Tizanidine (Zanaflex)  Vitamin B (Biotin)  Vitamin  D3 ==================================================================== For clinical consultation, please call [phone]. ====================================================================    UDS interpretation: Compliant          Medication Assessment Form: Reviewed. Patient indicates being compliant with therapy Treatment compliance: Compliant Risk Assessment Profile: Aberrant behavior: See prior evaluations. None observed or detected today Comorbid factors increasing risk of overdose: See prior notes. No additional risks detected today Risk of substance use disorder (SUD): Low Opioid Risk Tool (ORT) Total Score:    Interpretation Table:  Score <3 = Low Risk for SUD  Score between 4-7 = Moderate Risk for SUD  Score >8 = High Risk for Opioid Abuse   Risk Mitigation Strategies:  Patient Counseling: Covered Patient-Prescriber Agreement (PPA): Present and active  Notification to other healthcare providers: Done  Pharmacologic Plan: No change in therapy, at this time  Laboratory Chemistry  Inflammation Markers Lab Results  Component Value Date   ESRSEDRATE 4 09/27/2015   CRP <0.5 09/27/2015   Renal Function Lab Results  Component Value Date   BUN 9 03/10/2016   CREATININE 0.74 (L) 03/10/2016   GFRAA 120 03/10/2016   GFRNONAA 104 03/10/2016   Hepatic Function Lab Results  Component Value Date   AST 33 03/10/2016   ALT 33 03/10/2016   ALBUMIN 4.6 03/10/2016  Electrolytes Lab Results  Component Value Date   NA 144 03/10/2016   K 5.2 03/10/2016   CL 101 03/10/2016   CALCIUM 9.2 03/10/2016   MG 1.3 (L) 09/27/2015   Pain Modulating Vitamins Lab Results  Component Value Date   25OHVITD1 17 (L) 09/27/2015   25OHVITD2 <1.0 09/27/2015   25OHVITD3 17 09/27/2015   Coagulation Parameters Lab Results  Component Value Date   PLT 211 11/18/2011   Cardiovascular Lab Results  Component Value Date   HGB 15.9 11/18/2011   HCT 45.8 11/18/2011   Note: Lab  results reviewed.  Recent Diagnostic Imaging Review  No results found. Note: Imaging results reviewed.  Meds  The patient has a current medication list which includes the following prescription(s): aspirin, atenolol, atorvastatin, biotin, vitamin d3, clonazepam, clonazepam, diphenhydramine-acetaminophen, esomeprazole, furosemide, januvia, lisinopril, magnesium oxide, metformin, morphine, morphine, morphine, multiple vitamins-minerals, omega-3 acid ethyl esters, pregabalin, tizanidine, and vitamin b-12.  Current Outpatient Prescriptions on File Prior to Visit  Medication Sig  . aspirin 81 MG tablet Take 81 mg by mouth daily.  Marland Kitchen atenolol (TENORMIN) 100 MG tablet Take 1 tablet (100 mg total) by mouth daily.  Marland Kitchen atorvastatin (LIPITOR) 20 MG tablet Take 1 tablet (20 mg total) by mouth daily at 6 PM.  . Biotin 5000 MCG CAPS Take 5,000 capsules by mouth 2 (two) times daily.  . Cholecalciferol (VITAMIN D3) 2000 units TABS Take 2,000 tablets by mouth daily.  . clonazePAM (KLONOPIN) 0.5 MG tablet Take 1 tablet (0.5 mg total) by mouth at bedtime as needed for anxiety.  . clonazePAM (KLONOPIN) 1 MG tablet Take 1 tablet (1 mg total) by mouth 2 (two) times daily as needed.  . diphenhydramine-acetaminophen (TYLENOL PM) 25-500 MG TABS tablet Take 1 tablet by mouth at bedtime as needed.  Marland Kitchen esomeprazole (NEXIUM) 40 MG capsule Take 1 capsule (40 mg total) by mouth daily.  . furosemide (LASIX) 40 MG tablet Take 1 tablet (40 mg total) by mouth daily.  Marland Kitchen JANUVIA 50 MG tablet TAKE 1 TABLET BY MOUTH DAILY  . lisinopril (PRINIVIL,ZESTRIL) 2.5 MG tablet Take 1 tablet (2.5 mg total) by mouth daily.  . Magnesium Oxide 500 MG CAPS Take 1 capsule (500 mg total) by mouth daily.  . metFORMIN (GLUCOPHAGE) 1000 MG tablet Take 1 tablet (1,000 mg total) by mouth 2 (two) times daily.  . Multiple Vitamins-Minerals (SENTRY SENIOR PO) Take 1 tablet by mouth.  . omega-3 acid ethyl esters (LOVAZA) 1 g capsule Take 2 capsules (2 g  total) by mouth 2 (two) times daily.  . pregabalin (LYRICA) 50 MG capsule Take 1 capsule (50 mg total) by mouth 3 (three) times daily.  Marland Kitchen tiZANidine (ZANAFLEX) 4 MG tablet Take 1 tablet (4 mg total) by mouth daily.  . vitamin B-12 (CYANOCOBALAMIN) 500 MCG tablet Take 500 mcg by mouth daily.   No current facility-administered medications on file prior to visit.    ROS  Constitutional: Denies any fever or chills Gastrointestinal: No reported hemesis, hematochezia, vomiting, or acute GI distress Musculoskeletal: Denies any acute onset joint swelling, redness, loss of ROM, or weakness Neurological: No reported episodes of acute onset apraxia, aphasia, dysarthria, agnosia, amnesia, paralysis, loss of coordination, or loss of consciousness  Allergies  Mr. Mainer has No Known Allergies.  PFSH  Drug: Mr. Hinks  reports that he does not use drugs. Alcohol:  reports that he drinks alcohol. Tobacco:  reports that he has quit smoking. His smoking use included Cigarettes. He has never used smokeless tobacco.  Medical:  has a past medical history of Anxiety; Chronic LBP (09/13/2015); Chronic left hip pain (09/27/2015); Diabetes mellitus without complication ([place]); GERD (gastroesophageal reflux disease); Hyperlipidemia; Hyperlipidemia; Hypertension; and Hypomagnesemia. Family: family history includes Alcohol abuse in his father; Cancer in his maternal grandfather; Colon polyps in his mother; Diabetes in his paternal grandmother; Retinitis pigmentosa in his maternal grandmother and mother.  Past Surgical History:  Procedure Laterality Date  . CATARACT EXTRACTION  1992   Insert Prosthetic Lens  . LAMINECTOMY  2001   lumbar  . WRIST SURGERY Right    x2   Constitutional Exam  General appearance: Well nourished, well developed, and well hydrated. In no apparent acute distress Vitals:   09/07/16 0751  BP: (!) 142/85  Pulse: (!) 53  Resp: 15  Temp: 98.2 F (36.8 C)  TempSrc: Oral  SpO2: 99%   Weight: 240 lb (108.9 kg)  Height: _0  (1.753 m)   BMI Assessment: Estimated body mass index is 35.44 kg/m as calculated from the following:   Height as of this encounter: _1  (1.753 m).   Weight as of this encounter: 240 lb (108.9 kg).  BMI interpretation table: BMI level Category Range association with higher incidence of chronic pain  <18 kg/m2 Underweight   18.5-24.9 kg/m2 Ideal body weight   25-29.9 kg/m2 Overweight Increased incidence by 20%  30-34.9 kg/m2 Obese (Class I) Increased incidence by 68%  35-39.9 kg/m2 Severe obesity (Class II) Increased incidence by 136%  >40 kg/m2 Extreme obesity (Class III) Increased incidence by 254%   BMI Readings from Last 4 Encounters:  09/07/16 35.44 kg/m  06/09/16 36.82 kg/m  06/07/16 36.92 kg/m  03/22/16 35.44 kg/m   Wt Readings from Last 4 Encounters:  09/07/16 240 lb (108.9 kg)  06/09/16 249 lb 4.8 oz (113.1 kg)  06/07/16 250 lb (113.4 kg)  03/22/16 240 lb (108.9 kg)  Psych/Mental status: Alert, oriented x 3 (person, place, & time) Eyes: PERLA Respiratory: No evidence of acute respiratory distress  Cervical Spine Exam  Inspection: No masses, redness, or swelling Alignment: Symmetrical Functional ROM: Unrestricted ROM Stability: No instability detected Muscle strength & Tone: Functionally intact Sensory: Unimpaired Palpation: Non-contributory  Upper Extremity (UE) Exam    Side: Right upper extremity  Side: Left upper extremity  Inspection: No masses, redness, swelling, or asymmetry  Inspection: No masses, redness, swelling, or asymmetry  Functional ROM: Unrestricted ROM         Functional ROM: Unrestricted ROM          Muscle strength & Tone: Functionally intact  Muscle strength & Tone: Functionally intact  Sensory: Unimpaired  Sensory: Unimpaired  Palpation: Non-contributory  Palpation: Non-contributory   Thoracic Spine Exam  Inspection: No masses, redness, or swelling Alignment: Symmetrical Functional ROM:  Unrestricted ROM Stability: No instability detected Sensory: Unimpaired Muscle strength & Tone: Functionally intact Palpation: Non-contributory  Lumbar Spine Exam  Inspection: No masses, redness, or swelling Alignment: Symmetrical Functional ROM: Unrestricted ROM Stability: No instability detected Muscle strength & Tone: Functionally intact Sensory: Unimpaired Palpation: Non-contributory Provocative Tests: Lumbar Hyperextension and rotation test: evaluation deferred today       Patrick's Maneuver: evaluation deferred today              Gait & Posture Assessment  Ambulation: Unassisted Gait: Relatively normal for age and body habitus Posture: WNL   Lower Extremity Exam    Side: Right lower extremity  Side: Left lower extremity  Inspection: No masses, redness, swelling, or asymmetry  Inspection: No  masses, redness, swelling, or asymmetry  Functional ROM: Unrestricted ROM          Functional ROM: Unrestricted ROM          Muscle strength & Tone: Functionally intact  Muscle strength & Tone: Functionally intact  Sensory: Unimpaired  Sensory: Unimpaired  Palpation: Non-contributory  Palpation: Non-contributory   Assessment  Primary Diagnosis & Pertinent Problem List: The primary encounter diagnosis was Chronic low back pain (Location of Primary Source of Pain) (Bilateral) (L>R). Diagnoses of Chronic pain syndrome, Long term current use of opiate analgesic, and Opiate use (30 MME/Day) were also pertinent to this visit.  Visit Diagnosis: 1. Chronic low back pain (Location of Primary Source of Pain) (Bilateral) (L>R)   2. Chronic pain syndrome   3. Long term current use of opiate analgesic   4. Opiate use (30 MME/Day)    Plan of Care  Pharmacotherapy (Medications Ordered): Meds ordered this encounter  Medications  . morphine (MS CONTIN) 15 MG 12 hr tablet    Sig: Take 1 tablet (15 mg total) by mouth every 12 (twelve) hours.    Dispense:  60 tablet    Refill:  0    Do not place  this medication, or any other prescription from our practice, on "Automatic Refill". Patient may have prescription filled one day early if pharmacy is closed on scheduled refill date. Do not fill until: 09/22/16 To last until: 10/22/16  . morphine (MS CONTIN) 15 MG 12 hr tablet    Sig: Take 1 tablet (15 mg total) by mouth every 12 (twelve) hours.    Dispense:  60 tablet    Refill:  0    Do not place this medication, or any other prescription from our practice, on "Automatic Refill". Patient may have prescription filled one day early if pharmacy is closed on scheduled refill date. Do not fill until: 10/22/16 To last until: 11/21/16  . morphine (MS CONTIN) 15 MG 12 hr tablet    Sig: Take 1 tablet (15 mg total) by mouth every 12 (twelve) hours.    Dispense:  60 tablet    Refill:  0    Do not place this medication, or any other prescription from our practice, on "Automatic Refill". Patient may have prescription filled one day early if pharmacy is closed on scheduled refill date. Do not fill until: 11/21/16 To last until: 12/21/16   New Prescriptions   No medications on file   Medications administered today: Mr. Lerew had no medications administered during this visit. Lab-work, procedure(s), and/or referral(s): No orders of the defined types were placed in this encounter.  Imaging and/or referral(s): None  Interventional therapies: Planned, scheduled, and/or pending:   None at this time.    Considering:   Diagnostic bilateral lumbar facet block under fluoroscopic guidance and IV sedation. Possible spinal cord stimulator trial.   Palliative PRN treatment(s):   Diagnostic bilateral lumbar facet block under fluoroscopic guidance and IV sedation.   Provider-requested follow-up: Return in about 3 months (around 12/08/2016) for Med-Mgmt.  Future Appointments Date Time Provider [place]  09/07/2016 8:00 AM Milinda Pointer, MD [facility] None  09/07/2016 10:00 AM Roselee Nova, MD [place] None   Primary Care Physician: Roselee Nova, MD Location: [facility] Out[facility] Management Facility Note by: Kathlen Brunswick. Dossie Arbour, M.D, DABA, DABAPM, DABPM, DABIPP, FIPP  Pain Score Disclaimer: We use the NRS-11 scale. This is a self-reported, subjective measurement of pain severity with only modest accuracy. It is used  primarily to identify changes within a particular patient. It must be understood that outpatient pain scales are significantly less accurate that those used for research, where they can be applied under ideal controlled circumstances with minimal exposure to variables. In reality, the score is likely to be a combination of pain intensity and pain affect, where pain affect describes the degree of emotional arousal or changes in action readiness caused by the sensory experience of pain. Factors such as social and work situation, setting, emotional state, anxiety levels, expectation, and prior pain experience may influence pain perception and show large inter-individual differences that may also be affected by time variables.  Patient instructions provided during this appointment: There are no Patient Instructions on file for this visit.

## 2016-09-07 ENCOUNTER — Ambulatory Visit (INDEPENDENT_AMBULATORY_CARE_PROVIDER_SITE_OTHER): Payer: Medicare Other | Admitting: Family Medicine

## 2016-09-07 ENCOUNTER — Encounter: Payer: Self-pay | Admitting: Family Medicine

## 2016-09-07 ENCOUNTER — Encounter: Payer: Self-pay | Admitting: Pain Medicine

## 2016-09-07 ENCOUNTER — Ambulatory Visit: Payer: Medicare Other | Attending: Pain Medicine | Admitting: Pain Medicine

## 2016-09-07 VITALS — BP 139/77 | HR 60 | Temp 98.1°F | Resp 16 | Ht 69.0 in | Wt 241.4 lb

## 2016-09-07 VITALS — BP 142/85 | HR 53 | Temp 98.2°F | Resp 15 | Ht 69.0 in | Wt 240.0 lb

## 2016-09-07 DIAGNOSIS — G8929 Other chronic pain: Secondary | ICD-10-CM

## 2016-09-07 DIAGNOSIS — F1190 Opioid use, unspecified, uncomplicated: Secondary | ICD-10-CM | POA: Diagnosis not present

## 2016-09-07 DIAGNOSIS — M545 Low back pain: Secondary | ICD-10-CM | POA: Insufficient documentation

## 2016-09-07 DIAGNOSIS — E785 Hyperlipidemia, unspecified: Secondary | ICD-10-CM

## 2016-09-07 DIAGNOSIS — Z7982 Long term (current) use of aspirin: Secondary | ICD-10-CM | POA: Insufficient documentation

## 2016-09-07 DIAGNOSIS — F411 Generalized anxiety disorder: Secondary | ICD-10-CM | POA: Diagnosis not present

## 2016-09-07 DIAGNOSIS — E1140 Type 2 diabetes mellitus with diabetic neuropathy, unspecified: Secondary | ICD-10-CM | POA: Diagnosis not present

## 2016-09-07 DIAGNOSIS — Z79899 Other long term (current) drug therapy: Secondary | ICD-10-CM | POA: Diagnosis not present

## 2016-09-07 DIAGNOSIS — M47816 Spondylosis without myelopathy or radiculopathy, lumbar region: Secondary | ICD-10-CM | POA: Insufficient documentation

## 2016-09-07 DIAGNOSIS — K219 Gastro-esophageal reflux disease without esophagitis: Secondary | ICD-10-CM | POA: Insufficient documentation

## 2016-09-07 DIAGNOSIS — M5442 Lumbago with sciatica, left side: Secondary | ICD-10-CM

## 2016-09-07 DIAGNOSIS — Z79891 Long term (current) use of opiate analgesic: Secondary | ICD-10-CM | POA: Diagnosis not present

## 2016-09-07 DIAGNOSIS — Z87891 Personal history of nicotine dependence: Secondary | ICD-10-CM | POA: Insufficient documentation

## 2016-09-07 DIAGNOSIS — Z811 Family history of alcohol abuse and dependence: Secondary | ICD-10-CM | POA: Insufficient documentation

## 2016-09-07 DIAGNOSIS — G894 Chronic pain syndrome: Secondary | ICD-10-CM

## 2016-09-07 DIAGNOSIS — M25562 Pain in left knee: Secondary | ICD-10-CM | POA: Diagnosis not present

## 2016-09-07 DIAGNOSIS — Z7984 Long term (current) use of oral hypoglycemic drugs: Secondary | ICD-10-CM | POA: Diagnosis not present

## 2016-09-07 DIAGNOSIS — G4701 Insomnia due to medical condition: Secondary | ICD-10-CM | POA: Diagnosis not present

## 2016-09-07 DIAGNOSIS — M48061 Spinal stenosis, lumbar region without neurogenic claudication: Secondary | ICD-10-CM | POA: Insufficient documentation

## 2016-09-07 DIAGNOSIS — I10 Essential (primary) hypertension: Secondary | ICD-10-CM | POA: Diagnosis not present

## 2016-09-07 DIAGNOSIS — F419 Anxiety disorder, unspecified: Secondary | ICD-10-CM | POA: Diagnosis not present

## 2016-09-07 DIAGNOSIS — Z23 Encounter for immunization: Secondary | ICD-10-CM

## 2016-09-07 DIAGNOSIS — M5441 Lumbago with sciatica, right side: Secondary | ICD-10-CM

## 2016-09-07 DIAGNOSIS — Z833 Family history of diabetes mellitus: Secondary | ICD-10-CM | POA: Insufficient documentation

## 2016-09-07 LAB — LIPID PANEL
Cholesterol: 113 mg/dL (ref <200)
HDL: 35 mg/dL — ABNORMAL LOW (ref >40)
LDL Cholesterol: 47 mg/dL
Total CHOL/HDL Ratio: 3.2 Ratio (ref <5.0)
Triglycerides: 156 mg/dL — ABNORMAL HIGH (ref <150)
VLDL: 31 mg/dL — ABNORMAL HIGH (ref <30)

## 2016-09-07 LAB — COMPLETE METABOLIC PANEL WITH GFR
ALT: 30 U/L (ref 9–46)
AST: 47 U/L — ABNORMAL HIGH (ref 10–35)
Albumin: 4.8 g/dL (ref 3.6–5.1)
Alkaline Phosphatase: 49 U/L (ref 40–115)
BUN: 7 mg/dL (ref 7–25)
CO2: 29 mmol/L (ref 20–31)
Calcium: 9.6 mg/dL (ref 8.6–10.3)
Chloride: 101 mmol/L (ref 98–110)
Creat: 0.84 mg/dL (ref 0.70–1.33)
GFR, Est African American: >89 mL/min (ref >=60)
GFR, Est Non African American: >89 mL/min (ref >=60)
Glucose, Bld: 122 mg/dL — ABNORMAL HIGH (ref 65–99)
Potassium: 4.6 mmol/L (ref 3.5–5.3)
Sodium: 141 mmol/L (ref 135–146)
Total Bilirubin: 1.1 mg/dL (ref 0.2–1.2)
Total Protein: 7.3 g/dL (ref 6.1–8.1)

## 2016-09-07 LAB — POCT UA - MICROALBUMIN
Albumin/Creatinine Ratio, Urine, POC: NEGATIVE
Creatinine, POC: NEGATIVE mg/dL
Microalbumin Ur, POC: NEGATIVE mg/L

## 2016-09-07 LAB — POCT GLYCOSYLATED HEMOGLOBIN (HGB A1C): Hemoglobin A1C: 5.7

## 2016-09-07 LAB — GLUCOSE, POCT (MANUAL RESULT ENTRY): POC Glucose: 134 mg/dl — AB (ref 70–99)

## 2016-09-07 MED ORDER — CLONAZEPAM 0.5 MG PO TABS: 0.5000 mg | ORAL_TABLET | Freq: Every evening | ORAL | 0 refills | Status: DC | PRN

## 2016-09-07 MED ORDER — PREGABALIN 50 MG PO CAPS: 50.0000 mg | ORAL_CAPSULE | Freq: Three times a day (TID) | ORAL | 0 refills | Status: DC

## 2016-09-07 MED ORDER — MORPHINE SULFATE ER 15 MG PO TBCR: 15.0000 mg | EXTENDED_RELEASE_TABLET | Freq: Two times a day (BID) | ORAL | 0 refills | Status: DC

## 2016-09-07 MED ORDER — SITAGLIPTIN PHOSPHATE 50 MG PO TABS: 50.0000 mg | ORAL_TABLET | Freq: Every day | ORAL | 1 refills | Status: DC

## 2016-09-07 MED ORDER — CLONAZEPAM 1 MG PO TABS: 1.0000 mg | ORAL_TABLET | Freq: Two times a day (BID) | ORAL | 0 refills | Status: DC | PRN

## 2016-09-07 NOTE — Progress Notes (Signed)
Name: Joel Bryant   MRN: [id number]    DOB: [birth year]   Date:09/07/2016       Progress Note  Subjective  Chief Complaint  Chief Complaint  Patient presents with  . Follow-up    3 mo  . Medication Refill    Diabetes  He presents for his follow-up diabetic visit. He has type 2 diabetes mellitus. His disease course has been improving. Hypoglycemia symptoms include nervousness/anxiousness. Pertinent negatives for diabetes include no fatigue, no foot paresthesias, no polydipsia and no polyuria. Diabetic complications include peripheral neuropathy. Pertinent negatives for diabetic complications include no CVA or heart disease. Current diabetic treatment includes oral agent (dual therapy). He is following a diabetic diet. His breakfast blood glucose range is generally 110-130 mg/dl. An ACE inhibitor/angiotensin II receptor blocker is being taken. Eye exam is current.  Anxiety  Presents for follow-up visit. Symptoms include excessive worry, insomnia and nervous/anxious behavior. Patient reports no depressed mood, irritability, panic or shortness of breath. The quality of sleep is fair.    Hyperlipidemia  This is a chronic problem. The problem is controlled. Recent lipid tests were reviewed and are normal. Pertinent negatives include no leg pain, myalgias or shortness of breath. Current antihyperlipidemic treatment includes statins.    Past Medical History:  Diagnosis Date  . Anxiety   . Chronic LBP 09/13/2015  . Chronic left hip pain 09/27/2015  . Diabetes mellitus without complication ([place])   . GERD (gastroesophageal reflux disease)   . Hyperlipidemia   . Hyperlipidemia   . Hypertension   . Hypomagnesemia     Past Surgical History:  Procedure Laterality Date  . CATARACT EXTRACTION  1992   Insert Prosthetic Lens  . LAMINECTOMY  2001   lumbar  . WRIST SURGERY Right    x2    Family History  Problem Relation Age of Onset  . Colon polyps Mother   . Retinitis pigmentosa Mother    . Retinitis pigmentosa Maternal Grandmother   . Cancer Maternal Grandfather   . Diabetes Paternal Grandmother   . Alcohol abuse Father     Social History   Social History  . Marital status: Divorced    Spouse name: N/A  . Number of children: N/A  . Years of education: N/A   Occupational History  . Not on file.   Social History Main Topics  . Smoking status: Former Smoker    Types: Cigarettes  . Smokeless tobacco: Never Used     Comment: Quit smoking 4 years now  . Alcohol use 0.0 oz/week     Comment: occassional/seldom  . Drug use: No     Comment: in past used marijuana   . Sexual activity: Not on file   Other Topics Concern  . Not on file   Social History Narrative  . No narrative on file     Current Outpatient Prescriptions:  .  aspirin 81 MG tablet, Take 81 mg by mouth daily., Disp: , Rfl:  .  atenolol (TENORMIN) 100 MG tablet, Take 1 tablet (100 mg total) by mouth daily., Disp: 90 tablet, Rfl: 1 .  atorvastatin (LIPITOR) 20 MG tablet, Take 1 tablet (20 mg total) by mouth daily at 6 PM., Disp: 90 tablet, Rfl: 1 .  Biotin 5000 MCG CAPS, Take 5,000 capsules by mouth 2 (two) times daily., Disp: , Rfl:  .  Cholecalciferol (VITAMIN D3) 2000 units TABS, Take 2,000 tablets by mouth daily., Disp: , Rfl:  .  clonazePAM (KLONOPIN) 0.5 MG tablet, Take 1  tablet (0.5 mg total) by mouth at bedtime as needed for anxiety., Disp: 90 tablet, Rfl: 0 .  clonazePAM (KLONOPIN) 1 MG tablet, Take 1 tablet (1 mg total) by mouth 2 (two) times daily as needed., Disp: 180 tablet, Rfl: 0 .  diphenhydramine-acetaminophen (TYLENOL PM) 25-500 MG TABS tablet, Take 1 tablet by mouth at bedtime as needed., Disp: , Rfl:  .  esomeprazole (NEXIUM) 40 MG capsule, Take 1 capsule (40 mg total) by mouth daily., Disp: 90 capsule, Rfl: 1 .  furosemide (LASIX) 40 MG tablet, Take 1 tablet (40 mg total) by mouth daily., Disp: 90 tablet, Rfl: 0 .  JANUVIA 50 MG tablet, TAKE 1 TABLET BY MOUTH DAILY, Disp: 90  tablet, Rfl: 1 .  lisinopril (PRINIVIL,ZESTRIL) 2.5 MG tablet, Take 1 tablet (2.5 mg total) by mouth daily., Disp: 90 tablet, Rfl: 1 .  Magnesium Oxide 500 MG CAPS, Take 1 capsule (500 mg total) by mouth daily., Disp: 100 capsule, Rfl: PRN .  metFORMIN (GLUCOPHAGE) 1000 MG tablet, Take 1 tablet (1,000 mg total) by mouth 2 (two) times daily., Disp: 180 tablet, Rfl: 1 .  [START ON 09/22/2016] morphine (MS CONTIN) 15 MG 12 hr tablet, Take 1 tablet (15 mg total) by mouth every 12 (twelve) hours., Disp: 60 tablet, Rfl: 0 .  [START ON 10/22/2016] morphine (MS CONTIN) 15 MG 12 hr tablet, Take 1 tablet (15 mg total) by mouth every 12 (twelve) hours., Disp: 60 tablet, Rfl: 0 .  [START ON 11/21/2016] morphine (MS CONTIN) 15 MG 12 hr tablet, Take 1 tablet (15 mg total) by mouth every 12 (twelve) hours., Disp: 60 tablet, Rfl: 0 .  Multiple Vitamins-Minerals (SENTRY SENIOR PO), Take 1 tablet by mouth., Disp: , Rfl:  .  omega-3 acid ethyl esters (LOVAZA) 1 g capsule, Take 2 capsules (2 g total) by mouth 2 (two) times daily., Disp: 360 capsule, Rfl: 1 .  pregabalin (LYRICA) 50 MG capsule, Take 1 capsule (50 mg total) by mouth 3 (three) times daily., Disp: 270 capsule, Rfl: 0 .  tiZANidine (ZANAFLEX) 4 MG tablet, Take 1 tablet (4 mg total) by mouth daily., Disp: 90 tablet, Rfl: 1 .  vitamin B-12 (CYANOCOBALAMIN) 500 MCG tablet, Take 500 mcg by mouth daily., Disp: , Rfl:   No Known Allergies   Review of Systems  Constitutional: Negative for fatigue and irritability.  Respiratory: Negative for shortness of breath.   Musculoskeletal: Negative for myalgias.  Endo/Heme/Allergies: Negative for polydipsia.  Psychiatric/Behavioral: The patient is nervous/anxious and has insomnia.     Objective  Vitals:   09/07/16 0902  BP: 139/77  Pulse: 60  Resp: 16  Temp: 98.1 F (36.7 C)  TempSrc: Oral  SpO2: 97%  Weight: 241 lb 6.4 oz (109.5 kg)  Height: 5\' 9"  (1.753 m)    Physical Exam  Constitutional: He is  oriented to person, place, and time and well-developed, well-nourished, and in no distress.  Cardiovascular: Normal rate, regular rhythm, S1 normal, S2 normal and normal heart sounds.   No murmur heard. Pulmonary/Chest: Effort normal and breath sounds normal. He has no wheezes. He has no rhonchi.  Abdominal: Soft. Bowel sounds are normal. There is no tenderness.  Neurological: He is alert and oriented to person, place, and time.  Psychiatric: Mood, memory, affect and judgment normal.  Nursing note and vitals reviewed.    Assessment & Plan  1. Need for immunization against influenza  - Flu Vaccine QUAD 36+ mos PF IM (Fluarix & Fluzone Quad PF)  2. Controlled  type 2 diabetes mellitus with diabetic neuropathy, without long-term current use of insulin ([facility]) Point-of-care A1c 5.7%, urine microalbumin is negative, glucose is 134 mg/dL. This is consistent with well-controlled diabetes. Continue on present pharmacotherapy - pregabalin (LYRICA) 50 MG capsule; Take 1 capsule (50 mg total) by mouth 3 (three) times daily.  Dispense: 270 capsule; Refill: 0 - sitaGLIPtin (JANUVIA) 50 MG tablet; Take 1 tablet (50 mg total) by mouth daily.  Dispense: 90 tablet; Refill: 1 - POCT Glucose (CBG) - POCT HgB A1C - POCT UA - Microalbumin  3. Dyslipidemia Repeat FLP, continue on statin therapy - Lipid Profile - COMPLETE METABOLIC PANEL WITH GFR  4. Generalized anxiety disorder Stable and responsive to clonazepam taken twice daily as needed. Refills provide - clonazePAM (KLONOPIN) 1 MG tablet; Take 1 tablet (1 mg total) by mouth 2 (two) times daily as needed.  Dispense: 180 tablet; Refill: 0  5. Insomnia secondary to chronic pain  - clonazePAM (KLONOPIN) 0.5 MG tablet; Take 1 tablet (0.5 mg total) by mouth at bedtime as needed for anxiety.  Dispense: 90 tablet; Refill: 0  Raygen Dahm [facility] 09/07/2016 9:33 AM

## 2016-09-07 NOTE — Patient Instructions (Signed)

## 2016-09-07 NOTE — Progress Notes (Signed)
Nursing Pain Medication Assessment:  Safety precautions to be maintained throughout the outpatient stay will include: orient to surroundings, keep bed in low position, maintain call bell within reach at all times, provide assistance with transfer out of bed and ambulation.  Medication Inspection Compliance: Pill count conducted under aseptic conditions, in front of the patient. Neither the pills nor the bottle was removed from the patient's sight at any time. Once count was completed pills were immediately returned to the patient in their original bottle.  Medication: See above Pill Count: 28 of 60 pills remain Bottle Appearance: Standard pharmacy container. Clearly labeled. Filled Date: 10 / 25 / 2017 Medication last intake:09/07/16 630am

## 2016-09-07 NOTE — Telephone Encounter (Signed)
Medication has been refilled and printed given to patient at office visit on 09/07/2016

## 2016-12-07 ENCOUNTER — Ambulatory Visit: Payer: Medicare Other | Attending: Pain Medicine | Admitting: Pain Medicine

## 2016-12-07 ENCOUNTER — Encounter: Payer: Self-pay | Admitting: Family Medicine

## 2016-12-07 ENCOUNTER — Other Ambulatory Visit: Payer: Self-pay | Admitting: Pain Medicine

## 2016-12-07 ENCOUNTER — Ambulatory Visit (INDEPENDENT_AMBULATORY_CARE_PROVIDER_SITE_OTHER): Payer: Medicare Other | Admitting: Family Medicine

## 2016-12-07 ENCOUNTER — Encounter: Payer: Self-pay | Admitting: Pain Medicine

## 2016-12-07 VITALS — BP 146/83 | HR 58 | Temp 98.7°F | Resp 18 | Ht 69.0 in | Wt 240.0 lb

## 2016-12-07 DIAGNOSIS — G8929 Other chronic pain: Secondary | ICD-10-CM | POA: Diagnosis not present

## 2016-12-07 DIAGNOSIS — E785 Hyperlipidemia, unspecified: Secondary | ICD-10-CM | POA: Diagnosis not present

## 2016-12-07 DIAGNOSIS — K219 Gastro-esophageal reflux disease without esophagitis: Secondary | ICD-10-CM | POA: Insufficient documentation

## 2016-12-07 DIAGNOSIS — G96198 Other disorders of meninges, not elsewhere classified: Secondary | ICD-10-CM

## 2016-12-07 DIAGNOSIS — G4701 Insomnia due to medical condition: Secondary | ICD-10-CM

## 2016-12-07 DIAGNOSIS — I10 Essential (primary) hypertension: Secondary | ICD-10-CM | POA: Insufficient documentation

## 2016-12-07 DIAGNOSIS — E781 Pure hyperglyceridemia: Secondary | ICD-10-CM | POA: Insufficient documentation

## 2016-12-07 DIAGNOSIS — Z8371 Family history of colonic polyps: Secondary | ICD-10-CM | POA: Diagnosis not present

## 2016-12-07 DIAGNOSIS — M488X6 Other specified spondylopathies, lumbar region: Secondary | ICD-10-CM | POA: Diagnosis not present

## 2016-12-07 DIAGNOSIS — Z5181 Encounter for therapeutic drug level monitoring: Secondary | ICD-10-CM | POA: Insufficient documentation

## 2016-12-07 DIAGNOSIS — M961 Postlaminectomy syndrome, not elsewhere classified: Secondary | ICD-10-CM

## 2016-12-07 DIAGNOSIS — M48061 Spinal stenosis, lumbar region without neurogenic claudication: Secondary | ICD-10-CM | POA: Insufficient documentation

## 2016-12-07 DIAGNOSIS — Z7982 Long term (current) use of aspirin: Secondary | ICD-10-CM | POA: Insufficient documentation

## 2016-12-07 DIAGNOSIS — G9619 Other disorders of meninges, not elsewhere classified: Secondary | ICD-10-CM | POA: Insufficient documentation

## 2016-12-07 DIAGNOSIS — M879 Osteonecrosis, unspecified: Secondary | ICD-10-CM | POA: Diagnosis not present

## 2016-12-07 DIAGNOSIS — Z79899 Other long term (current) drug therapy: Secondary | ICD-10-CM | POA: Insufficient documentation

## 2016-12-07 DIAGNOSIS — M5416 Radiculopathy, lumbar region: Secondary | ICD-10-CM

## 2016-12-07 DIAGNOSIS — Z79891 Long term (current) use of opiate analgesic: Secondary | ICD-10-CM

## 2016-12-07 DIAGNOSIS — G894 Chronic pain syndrome: Secondary | ICD-10-CM

## 2016-12-07 DIAGNOSIS — Z811 Family history of alcohol abuse and dependence: Secondary | ICD-10-CM | POA: Diagnosis not present

## 2016-12-07 DIAGNOSIS — F1190 Opioid use, unspecified, uncomplicated: Secondary | ICD-10-CM

## 2016-12-07 DIAGNOSIS — E1140 Type 2 diabetes mellitus with diabetic neuropathy, unspecified: Secondary | ICD-10-CM

## 2016-12-07 DIAGNOSIS — M549 Dorsalgia, unspecified: Secondary | ICD-10-CM | POA: Diagnosis present

## 2016-12-07 DIAGNOSIS — F411 Generalized anxiety disorder: Secondary | ICD-10-CM

## 2016-12-07 DIAGNOSIS — F419 Anxiety disorder, unspecified: Secondary | ICD-10-CM | POA: Insufficient documentation

## 2016-12-07 DIAGNOSIS — M25552 Pain in left hip: Secondary | ICD-10-CM | POA: Insufficient documentation

## 2016-12-07 DIAGNOSIS — M25551 Pain in right hip: Secondary | ICD-10-CM | POA: Insufficient documentation

## 2016-12-07 DIAGNOSIS — M47816 Spondylosis without myelopathy or radiculopathy, lumbar region: Secondary | ICD-10-CM | POA: Insufficient documentation

## 2016-12-07 DIAGNOSIS — Z86010 Personal history of colonic polyps: Secondary | ICD-10-CM | POA: Insufficient documentation

## 2016-12-07 DIAGNOSIS — Z87891 Personal history of nicotine dependence: Secondary | ICD-10-CM | POA: Diagnosis not present

## 2016-12-07 DIAGNOSIS — M25559 Pain in unspecified hip: Secondary | ICD-10-CM | POA: Diagnosis not present

## 2016-12-07 MED ORDER — MORPHINE SULFATE ER 15 MG PO TBCR: 15.0000 mg | EXTENDED_RELEASE_TABLET | Freq: Two times a day (BID) | ORAL | 0 refills | Status: DC

## 2016-12-07 MED ORDER — METFORMIN HCL 1000 MG PO TABS: 1000.0000 mg | ORAL_TABLET | Freq: Two times a day (BID) | ORAL | 1 refills | Status: DC

## 2016-12-07 MED ORDER — OMEGA-3-ACID ETHYL ESTERS 1 G PO CAPS: 2.0000 g | ORAL_CAPSULE | Freq: Two times a day (BID) | ORAL | 1 refills | Status: DC

## 2016-12-07 MED ORDER — LISINOPRIL 2.5 MG PO TABS: 2.5000 mg | ORAL_TABLET | Freq: Every day | ORAL | 1 refills | Status: DC

## 2016-12-07 MED ORDER — ATENOLOL 100 MG PO TABS: 100.0000 mg | ORAL_TABLET | Freq: Every day | ORAL | 1 refills | Status: DC

## 2016-12-07 MED ORDER — ESOMEPRAZOLE MAGNESIUM 40 MG PO CPDR: 40.0000 mg | DELAYED_RELEASE_CAPSULE | Freq: Every day | ORAL | 1 refills | Status: DC

## 2016-12-07 MED ORDER — ATORVASTATIN CALCIUM 20 MG PO TABS: 20.0000 mg | ORAL_TABLET | Freq: Every day | ORAL | 1 refills | Status: DC

## 2016-12-07 MED ORDER — CLONAZEPAM 0.5 MG PO TABS: 0.5000 mg | ORAL_TABLET | Freq: Every evening | ORAL | 0 refills | Status: DC | PRN

## 2016-12-07 MED ORDER — CLONAZEPAM 1 MG PO TABS: 1.0000 mg | ORAL_TABLET | Freq: Two times a day (BID) | ORAL | 0 refills | Status: DC | PRN

## 2016-12-07 MED ORDER — SITAGLIPTIN PHOSPHATE 50 MG PO TABS: 50.0000 mg | ORAL_TABLET | Freq: Every day | ORAL | 1 refills | Status: DC

## 2016-12-07 MED ORDER — TIZANIDINE HCL 4 MG PO TABS: 4.0000 mg | ORAL_TABLET | Freq: Every day | ORAL | 1 refills | Status: DC

## 2016-12-07 MED ORDER — PREGABALIN 50 MG PO CAPS: 50.0000 mg | ORAL_CAPSULE | Freq: Three times a day (TID) | ORAL | 0 refills | Status: DC

## 2016-12-07 NOTE — Progress Notes (Signed)
Nursing Pain Medication Assessment:  Safety precautions to be maintained throughout the outpatient stay will include: orient to surroundings, keep bed in low position, maintain call bell within reach at all times, provide assistance with transfer out of bed and ambulation.  Medication Inspection Compliance: Pill count conducted under aseptic conditions, in front of the patient. Neither the pills nor the bottle was removed from the patient's sight at any time. Once count was completed pills were immediately returned to the patient in their original bottle.  Medication: Morphine ER (MSContin) Pill/Patch Count: 27 of 60 pills remain Bottle Appearance: Standard pharmacy container. Clearly labeled. Filled Date: 01 / 23/ 2018 Last Medication intake:  Today

## 2016-12-07 NOTE — Progress Notes (Signed)
Name: Joel Bryant   MRN: [id number]    DOB: Sep 19, 1961   Date:12/07/2016       Progress Note  Subjective  Chief Complaint  Chief Complaint  Patient presents with  . Medication Refill    Diabetes  He presents for his follow-up diabetic visit. He has type 2 diabetes mellitus. His disease course has been improving. Hypoglycemia symptoms include nervousness/anxiousness. Pertinent negatives for diabetes include no chest pain, no fatigue, no foot paresthesias (controlled on Lyrica.), no polydipsia and no polyuria. Diabetic complications include peripheral neuropathy. Pertinent negatives for diabetic complications include no CVA or heart disease. Current diabetic treatment includes oral agent (dual therapy). He is following a diabetic diet. His breakfast blood glucose range is generally 110-130 mg/dl. An ACE inhibitor/angiotensin II receptor blocker is being taken. Eye exam is current.  Anxiety  Presents for follow-up visit. Symptoms include excessive worry, insomnia and nervous/anxious behavior. Patient reports no chest pain, depressed mood, irritability, panic or shortness of breath. The quality of sleep is fair.    Hyperlipidemia  This is a chronic problem. The problem is controlled. Recent lipid tests were reviewed and are normal. Pertinent negatives include no chest pain, leg pain, myalgias or shortness of breath. Current antihyperlipidemic treatment includes statins (Taking Vascepa for hypertriglyceridemia).  Gastroesophageal Reflux  He reports no abdominal pain, no chest pain, no coughing, no heartburn or no sore throat. This is a chronic problem. The problem has been unchanged. Pertinent negatives include no fatigue. He has tried a PPI for the symptoms.     Past Medical History:  Diagnosis Date  . Anxiety   . Chronic LBP 09/13/2015  . Chronic left hip pain 09/27/2015  . Diabetes mellitus without complication ([place])   . GERD (gastroesophageal reflux disease)   . Hyperlipidemia   .  Hyperlipidemia   . Hypertension   . Hypomagnesemia     Past Surgical History:  Procedure Laterality Date  . CATARACT EXTRACTION  1992   Insert Prosthetic Lens  . LAMINECTOMY  2001   lumbar  . WRIST SURGERY Right    x2    Family History  Problem Relation Age of Onset  . Colon polyps Mother   . Retinitis pigmentosa Mother   . Retinitis pigmentosa Maternal Grandmother   . Cancer Maternal Grandfather   . Diabetes Paternal Grandmother   . Alcohol abuse Father     Social History   Social History  . Marital status: Divorced    Spouse name: N/A  . Number of children: N/A  . Years of education: N/A   Occupational History  . Not on file.   Social History Main Topics  . Smoking status: Former Smoker    Types: Cigarettes  . Smokeless tobacco: Never Used     Comment: Quit smoking 4 years now  . Alcohol use 0.0 oz/week     Comment: occassional/seldom  . Drug use: No     Comment: in past used marijuana   . Sexual activity: Not on file   Other Topics Concern  . Not on file   Social History Narrative  . No narrative on file     Current Outpatient Prescriptions:  .  aspirin 81 MG tablet, Take 81 mg by mouth daily., Disp: , Rfl:  .  atenolol (TENORMIN) 100 MG tablet, Take 1 tablet (100 mg total) by mouth daily., Disp: 90 tablet, Rfl: 1 .  atorvastatin (LIPITOR) 20 MG tablet, Take 1 tablet (20 mg total) by mouth daily at 6 PM., Disp:  90 tablet, Rfl: 1 .  Biotin 5000 MCG CAPS, Take 5,000 capsules by mouth 2 (two) times daily., Disp: , Rfl:  .  Cholecalciferol (VITAMIN D3) 2000 units TABS, Take 2,000 tablets by mouth daily., Disp: , Rfl:  .  clonazePAM (KLONOPIN) 0.5 MG tablet, Take 1 tablet (0.5 mg total) by mouth at bedtime as needed for anxiety., Disp: 90 tablet, Rfl: 0 .  clonazePAM (KLONOPIN) 1 MG tablet, Take 1 tablet (1 mg total) by mouth 2 (two) times daily as needed., Disp: 180 tablet, Rfl: 0 .  diphenhydramine-acetaminophen (TYLENOL PM) 25-500 MG TABS tablet, Take 1  tablet by mouth at bedtime as needed., Disp: , Rfl:  .  esomeprazole (NEXIUM) 40 MG capsule, Take 1 capsule (40 mg total) by mouth daily., Disp: 90 capsule, Rfl: 1 .  furosemide (LASIX) 40 MG tablet, Take 1 tablet (40 mg total) by mouth daily., Disp: 90 tablet, Rfl: 0 .  lisinopril (PRINIVIL,ZESTRIL) 2.5 MG tablet, Take 1 tablet (2.5 mg total) by mouth daily., Disp: 90 tablet, Rfl: 1 .  Magnesium Oxide 500 MG CAPS, Take 1 capsule (500 mg total) by mouth daily., Disp: 100 capsule, Rfl: PRN .  metFORMIN (GLUCOPHAGE) 1000 MG tablet, Take 1 tablet (1,000 mg total) by mouth 2 (two) times daily., Disp: 180 tablet, Rfl: 1 .  [START ON 12/21/2016] morphine (MS CONTIN) 15 MG 12 hr tablet, Take 1 tablet (15 mg total) by mouth every 12 (twelve) hours., Disp: 60 tablet, Rfl: 0 .  [START ON 01/20/2017] morphine (MS CONTIN) 15 MG 12 hr tablet, Take 1 tablet (15 mg total) by mouth every 12 (twelve) hours., Disp: 60 tablet, Rfl: 0 .  [START ON 02/19/2017] morphine (MS CONTIN) 15 MG 12 hr tablet, Take 1 tablet (15 mg total) by mouth every 12 (twelve) hours., Disp: 60 tablet, Rfl: 0 .  Multiple Vitamins-Minerals (SENTRY SENIOR PO), Take 1 tablet by mouth., Disp: , Rfl:  .  omega-3 acid ethyl esters (LOVAZA) 1 g capsule, Take 2 capsules (2 g total) by mouth 2 (two) times daily., Disp: 360 capsule, Rfl: 1 .  pregabalin (LYRICA) 50 MG capsule, Take 1 capsule (50 mg total) by mouth 3 (three) times daily., Disp: 270 capsule, Rfl: 0 .  sitaGLIPtin (JANUVIA) 50 MG tablet, Take 1 tablet (50 mg total) by mouth daily., Disp: 90 tablet, Rfl: 1 .  tiZANidine (ZANAFLEX) 4 MG tablet, Take 1 tablet (4 mg total) by mouth daily., Disp: 90 tablet, Rfl: 1 .  vitamin B-12 (CYANOCOBALAMIN) 500 MCG tablet, Take 500 mcg by mouth daily., Disp: , Rfl:   No Known Allergies   Review of Systems  Constitutional: Negative for fatigue and irritability.  HENT: Negative for sore throat.   Respiratory: Negative for cough and shortness of breath.    Cardiovascular: Negative for chest pain.  Gastrointestinal: Negative for abdominal pain and heartburn.  Musculoskeletal: Negative for myalgias.  Endo/Heme/Allergies: Negative for polydipsia.  Psychiatric/Behavioral: The patient is nervous/anxious and has insomnia.      Objective  Vitals:   12/07/16 0903  BP: 120/70  Pulse: 61  Resp: 18  Temp: 98.2 F (36.8 C)  TempSrc: Oral  SpO2: 95%  Weight: 233 lb 9.6 oz (106 kg)  Height: 5\' 9"  (1.753 m)    Physical Exam  Constitutional: He is oriented to person, place, and time and well-developed, well-nourished, and in no distress.  HENT:  Head: Normocephalic and atraumatic.  Cardiovascular: Normal rate, regular rhythm and normal heart sounds.   No murmur heard. Pulmonary/Chest:  Effort normal and breath sounds normal. He has no wheezes.  Abdominal: Soft. Bowel sounds are normal. There is no tenderness.  Musculoskeletal: He exhibits edema (trace pitting edema bilaterally.).  Neurological: He is alert and oriented to person, place, and time.  Psychiatric: Mood, memory, affect and judgment normal.  Nursing note and vitals reviewed.      Assessment & Plan  1. Controlled type 2 diabetes mellitus with diabetic neuropathy, without long-term current use of insulin ([place]) Patient recently had lab work performed as part of her mother's health assessment by his insurance company, his A1c was reportedly 5.5%. Patient is advised to stay on current pharmacotherapy. - lisinopril (PRINIVIL,ZESTRIL) 2.5 MG tablet; Take 1 tablet (2.5 mg total) by mouth daily.  Dispense: 90 tablet; Refill: 1 - metFORMIN (GLUCOPHAGE) 1000 MG tablet; Take 1 tablet (1,000 mg total) by mouth 2 (two) times daily.  Dispense: 180 tablet; Refill: 1 - pregabalin (LYRICA) 50 MG capsule; Take 1 capsule (50 mg total) by mouth 3 (three) times daily.  Dispense: 270 capsule; Refill: 0 - sitaGLIPtin (JANUVIA) 50 MG tablet; Take 1 tablet (50 mg total) by mouth daily.  Dispense: 90  tablet; Refill: 1  2. Dyslipidemia  - atorvastatin (LIPITOR) 20 MG tablet; Take 1 tablet (20 mg total) by mouth daily at 6 PM.  Dispense: 90 tablet; Refill: 1  3. Essential hypertension  - atenolol (TENORMIN) 100 MG tablet; Take 1 tablet (100 mg total) by mouth daily.  Dispense: 90 tablet; Refill: 1  4. Gastroesophageal reflux disease, esophagitis presence not specified  - esomeprazole (NEXIUM) 40 MG capsule; Take 1 capsule (40 mg total) by mouth daily.  Dispense: 90 capsule; Refill: 1  5. Generalized anxiety disorder Stable and responsive to clonazepam taken twice daily when needed - clonazePAM (KLONOPIN) 1 MG tablet; Take 1 tablet (1 mg total) by mouth 2 (two) times daily as needed.  Dispense: 180 tablet; Refill: 0  6. Hypertriglyceridemia  - omega-3 acid ethyl esters (LOVAZA) 1 g capsule; Take 2 capsules (2 g total) by mouth 2 (two) times daily.  Dispense: 360 capsule; Refill: 1  7. Insomnia secondary to chronic pain  - clonazePAM (KLONOPIN) 0.5 MG tablet; Take 1 tablet (0.5 mg total) by mouth at bedtime as needed for anxiety.  Dispense: 90 tablet; Refill: 0  8. Lumbar radiculopathy He is being seen by pain management and is on opioid therapy, continue on tizanidine for relief of muscle spasm - tiZANidine (ZANAFLEX) 4 MG tablet; Take 1 tablet (4 mg total) by mouth daily.  Dispense: 90 tablet; Refill: 1   Sheryn Aldaz Asad [facility] 12/07/2016 9:13 AM

## 2016-12-07 NOTE — Progress Notes (Signed)
Patient's Name: Joel Bryant  MRN: [id number]  Referring Provider: Roselee Nova, MD  DOB: 1961-04-28  PCP: Roselee Nova, MD  DOS: 12/07/2016  Note by: Kathlen Brunswick. Dossie Arbour, MD  Service setting: Ambulatory outpatient  Specialty: Interventional Pain Management  Location: [facility] (AMB) Pain Management Facility    Patient type: Established   Primary Reason(s) for Visit: Encounter for prescription drug management (Level of risk: moderate) CC: Back Pain and Hip Pain  HPI  Joel Bryant is a 56 y.o. year old, male patient, who comes today for a medication management evaluation. 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); Dietary counseling and surveillance; Long term current use of opiate analgesic; Long term prescription opiate use; Opiate use (30 MME/Day); Encounter for therapeutic drug level monitoring; Encounter for chronic pain management; Lumbar spondylosis; Failed back surgical syndrome; Epidural fibrosis; Chronic low back pain (Location of Primary Source of Pain) (Bilateral) (L>R); Hypomagnesemia; Encounter for long-term (current) use of other high-risk medications; Encounter for opiate analgesic use agreement; Opioid use agreement exists; Chronic left knee pain; Lumbar spinal stenosis (L4-5); 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 His primarily concern today is the Back Pain and Hip Pain  Pain Assessment: Self-Reported Pain Score: 2 /10             Reported level is compatible with observation.       Pain Type: Chronic pain Pain Location: Back (hips) Pain Orientation: Lower Pain Descriptors / Indicators: Sharp, Stabbing Pain Frequency: Intermittent  Joel Bryant was last seen on 09/07/2016 for medication management. During today's appointment we reviewed Joel Bryant's chronic pain status, as well as his outpatient medication regimen.  The patient  reports that he does not use drugs. His body mass index is 35.44 kg/m.  Further details on both, my assessment(s), as well as the proposed treatment plan, please see below.  Controlled Substance Pharmacotherapy Assessment REMS (Risk Evaluation and Mitigation Strategy)  Analgesic:MS Contin (morphine ER) 15 mg every 12 hours (30 mg/day) MME/day:30 mg/day Joel Jarred, RN  12/07/2016  7:56 AM  Signed Nursing Pain Medication Assessment:  Safety precautions to be maintained throughout the outpatient stay will include: orient to surroundings, keep bed in low position, maintain call bell within reach at all times, provide assistance with transfer out of bed and ambulation.  Medication Inspection Compliance: Pill count conducted under aseptic conditions, in front of the patient. Neither the pills nor the bottle was removed from the patient's sight at any time. Once count was completed pills were immediately returned to the patient in their original bottle.  Medication: Morphine ER (MSContin) Pill/Patch Count: 27 of 60 pills remain Bottle Appearance: Standard pharmacy container. Clearly labeled. Filled Date: 01 / 23/ 2018 Last Medication intake:  Today   Pharmacokinetics: Liberation and absorption (onset of action): WNL Distribution (time to peak effect):  WNL Metabolism and excretion (duration of action): WNL  Pharmacodynamics: Desired effects: Analgesia: [name] reports >50% benefit. Functional ability: Patient reports that medication allows him to accomplish basic ADLs Clinically meaningful improvement in function (CMIF): Sustained CMIF goals met Perceived effectiveness: Described as relatively effective, allowing for increase in activities of daily living (ADL) Undesirable effects: Side-effects or Adverse reactions: None reported Monitoring: [place] PMP: Online review of the past 75-monthperiod conducted. Compliant with practice rules and regulations List of all UDS test(s) done:  Lab Results  Component Value Date   TOXASSSELUR FINAL 06/07/2016   TOXASSSELUR FINAL 03/22/2016   TOXASSSELUR FINAL 12/27/2015   TOXASSSELUR FINAL 09/27/2015   Last UDS on record: ToxAssure Select 13  Date Value Ref Range Status  06/07/2016 FINAL  Final    Comment:    ==================================================================== TOXASSURE SELECT 13 (MW) ==================================================================== Test                             Result       Flag       Units Drug Present and Declared for Prescription Verification   7-aminoclonazepam              119          EXPECTED   ng/mg creat    7-aminoclonazepam is an expected metabolite of clonazepam. Source    of clonazepam is a scheduled prescription medication.   Morphine                       9674         EXPECTED   ng/mg creat    Potential sources of large amounts of morphine in the absence of    codeine include administration of morphine or use of heroin. ==================================================================== Test                      Result    Flag   Units      Ref Range   Creatinine              43               mg/dL      >=20 ==================================================================== Declared Medications:  The flagging and  interpretation on this report are based on the  following declared medications.  Unexpected results may arise from  inaccuracies in the declared medications.  **Note: The testing scope of this panel includes these medications:  Clonazepam  Morphine  **Note: The testing scope of this panel does not include following  reported medications:  Acetaminophen (Tylenol PM)  Aspirin  Atenolol  Atorvastatin (Lipitor)  Cyanocobalamin  Diphenhydramine (Tylenol PM)  Furosemide (Lasix)  Lisinopril  Magnesium Oxide  Metformin (Glucophage)  Multivitamin  Omega-3 Fatty Acids (Lovaza)  Omeprazole (Nexium)  Pregabalin (Lyrica)  Sitagliptin (Januvia)  Tizanidine (Zanaflex)  Vitamin B (Biotin)  Vitamin D3 ==================================================================== For clinical consultation, please call ([phone] ====================================================================    UDS interpretation: Compliant          Medication Assessment Form: Reviewed. Patient indicates being compliant with therapy Treatment compliance: Compliant Risk Assessment Profile: Aberrant behavior: See prior evaluations. None observed or detected today Comorbid factors increasing risk of overdose: See prior notes. No additional risks detected today Risk of substance use disorder (SUD): Moderate-to-High Opioid Risk Tool (ORT) Total Score: 10  Interpretation Table:  Score <3 = Low Risk for SUD  Score between 4-7 = Moderate Risk for SUD  Score >8 = High  Risk for Opioid Abuse   Risk Mitigation Strategies:  Patient Counseling: Covered Patient-Prescriber Agreement (PPA): Present and active  Notification to other healthcare providers: Done  Pharmacologic Plan: No change in therapy, at this time  Laboratory Chemistry  Inflammation Markers Lab Results  Component Value Date   ESRSEDRATE 4 09/27/2015   CRP <0.5 09/27/2015   Renal Function Lab Results  Component Value Date   BUN 7 09/07/2016    CREATININE 0.84 09/07/2016   GFRAA >89 09/07/2016   GFRNONAA >89 09/07/2016   Hepatic Function Lab Results  Component Value Date   AST 47 (H) 09/07/2016   ALT 30 09/07/2016   ALBUMIN 4.8 09/07/2016   Electrolytes Lab Results  Component Value Date   NA 141 09/07/2016   K 4.6 09/07/2016   CL 101 09/07/2016   CALCIUM 9.6 09/07/2016   MG 1.3 (L) 09/27/2015   Pain Modulating Vitamins Lab Results  Component Value Date   25OHVITD1 17 (L) 09/27/2015   25OHVITD2 <1.0 09/27/2015   25OHVITD3 17 09/27/2015   Coagulation Parameters Lab Results  Component Value Date   PLT 211 11/18/2011   Cardiovascular Lab Results  Component Value Date   HGB 15.9 11/18/2011   HCT 45.8 11/18/2011   Note: Lab results reviewed.  Recent Diagnostic Imaging Review  No results found. Note: Imaging results reviewed.          Meds  The patient has a current medication list which includes the following prescription(s): aspirin, biotin, vitamin d3, diphenhydramine-acetaminophen, furosemide, magnesium oxide, morphine, morphine, morphine, multiple vitamins-minerals, pregabalin, tizanidine, vitamin b-12, atenolol, atorvastatin, clonazepam, clonazepam, esomeprazole, lisinopril, metformin, omega-3 acid ethyl esters, and sitagliptin.  Current Outpatient Prescriptions on File Prior to Visit  Medication Sig  . aspirin 81 MG tablet Take 81 mg by mouth daily.  . Biotin 5000 MCG CAPS Take 5,000 capsules by mouth 2 (two) times daily.  . Cholecalciferol (VITAMIN D3) 2000 units TABS Take 2,000 tablets by mouth daily.  . diphenhydramine-acetaminophen (TYLENOL PM) 25-500 MG TABS tablet Take 1 tablet by mouth at bedtime as needed.  . furosemide (LASIX) 40 MG tablet Take 1 tablet (40 mg total) by mouth daily.  . Magnesium Oxide 500 MG CAPS Take 1 capsule (500 mg total) by mouth daily.  . Multiple Vitamins-Minerals (SENTRY SENIOR PO) Take 1 tablet by mouth.  . vitamin B-12 (CYANOCOBALAMIN) 500 MCG tablet Take 500 mcg  by mouth daily.   No current facility-administered medications on file prior to visit.    ROS  Constitutional: Denies any fever or chills Gastrointestinal: No reported hemesis, hematochezia, vomiting, or acute GI distress Musculoskeletal: Denies any acute onset joint swelling, redness, loss of ROM, or weakness Neurological: No reported episodes of acute onset apraxia, aphasia, dysarthria, agnosia, amnesia, paralysis, loss of coordination, or loss of consciousness  Allergies  Mr. Mikles has No Known Allergies.  PFSH  Drug: Mr. Wisehart  reports that he does not use drugs. Alcohol:  reports that he drinks alcohol. Tobacco:  reports that he has quit smoking. His smoking use included Cigarettes. He has never used smokeless tobacco. Medical:  has a past medical history of Anxiety; Chronic LBP (09/13/2015); Chronic left hip pain (09/27/2015); Diabetes mellitus without complication ([place]); GERD (gastroesophageal reflux disease); Hyperlipidemia; Hyperlipidemia; Hypertension; and Hypomagnesemia. Family: family history includes Alcohol abuse in his father; Cancer in his maternal grandfather; Colon polyps in his mother; Diabetes in his paternal grandmother; Retinitis pigmentosa in his maternal grandmother and mother.  Past Surgical History:  Procedure Laterality Date  .  CATARACT EXTRACTION  1992   Insert Prosthetic Lens  . LAMINECTOMY  2001   lumbar  . WRIST SURGERY Right    x2   Constitutional Exam  General appearance: Well nourished, well developed, and well hydrated. In no apparent acute distress Vitals:   12/07/16 0745 12/07/16 0748  BP:  (!) 146/83  Pulse: (!) 58   Resp: 18   Temp: 98.7 F (37.1 C)   SpO2: 100%   Weight: 240 lb (108.9 kg)   Height: _0  (1.753 m)    BMI Assessment: Estimated body mass index is 35.44 kg/m as calculated from the following:   Height as of this encounter: _1  (1.753 m).   Weight as of this encounter: 240 lb (108.9 kg).  BMI interpretation  table: BMI level Category Range association with higher incidence of chronic pain  <18 kg/m2 Underweight   18.5-24.9 kg/m2 Ideal body weight   25-29.9 kg/m2 Overweight Increased incidence by 20%  30-34.9 kg/m2 Obese (Class I) Increased incidence by 68%  35-39.9 kg/m2 Severe obesity (Class II) Increased incidence by 136%  >40 kg/m2 Extreme obesity (Class III) Increased incidence by 254%   BMI Readings from Last 4 Encounters:  12/07/16 34.50 kg/m  12/07/16 35.44 kg/m  09/07/16 35.65 kg/m  09/07/16 35.44 kg/m   Wt Readings from Last 4 Encounters:  12/07/16 233 lb 9.6 oz (106 kg)  12/07/16 240 lb (108.9 kg)  09/07/16 241 lb 6.4 oz (109.5 kg)  09/07/16 240 lb (108.9 kg)  Psych/Mental status: Alert, oriented x 3 (person, place, & time)       Eyes: PERLA Respiratory: No evidence of acute respiratory distress  Cervical Spine Exam  Inspection: No masses, redness, or swelling Alignment: Symmetrical Functional ROM: Unrestricted ROM Stability: No instability detected Muscle strength & Tone: Functionally intact Sensory: Unimpaired Palpation: Non-contributory  Upper Extremity (UE) Exam    Side: Right upper extremity  Side: Left upper extremity  Inspection: No masses, redness, swelling, or asymmetry. No contractures  Inspection: No masses, redness, swelling, or asymmetry. No contractures  Functional ROM: Unrestricted ROM          Functional ROM: Unrestricted ROM          Muscle strength & Tone: Functionally intact  Muscle strength & Tone: Functionally intact  Sensory: Unimpaired  Sensory: Unimpaired  Palpation: Euthermic  Palpation: Euthermic  Specialized Test(s): Deferred         Specialized Test(s): Deferred          Thoracic Spine Exam  Inspection: No masses, redness, or swelling Alignment: Symmetrical Functional ROM: Unrestricted ROM Stability: No instability detected Sensory: Unimpaired Muscle strength & Tone: Functionally intact Palpation: Non-contributory  Lumbar Spine  Exam  Inspection: No masses, redness, or swelling Alignment: Symmetrical Functional ROM: Unrestricted ROM Stability: No instability detected Muscle strength & Tone: Functionally intact Sensory: Unimpaired Palpation: Non-contributory Provocative Tests: Lumbar Hyperextension and rotation test: evaluation deferred today       Patrick's Maneuver: evaluation deferred today              Gait & Posture Assessment  Ambulation: Unassisted Gait: Relatively normal for age and body habitus Posture: WNL   Lower Extremity Exam    Side: Right lower extremity  Side: Left lower extremity  Inspection: No masses, redness, swelling, or asymmetry. No contractures  Inspection: No masses, redness, swelling, or asymmetry. No contractures  Functional ROM: Unrestricted ROM          Functional ROM: Unrestricted ROM  Muscle strength & Tone: Functionally intact  Muscle strength & Tone: Functionally intact  Sensory: Unimpaired  Sensory: Unimpaired  Palpation: No palpable anomalies  Palpation: No palpable anomalies   Assessment  Primary Diagnosis & Pertinent Problem List: The primary encounter diagnosis was Chronic pain syndrome. Diagnoses of Epidural fibrosis, Chronic hip pain, unspecified laterality, Failed back surgical syndrome, Long term prescription opiate use, and Opiate use (30 MME/Day) were also pertinent to this visit.  Status Diagnosis  Controlled Controlled Controlled 1. Chronic pain syndrome   2. Epidural fibrosis   3. Chronic hip pain, unspecified laterality   4. Failed back surgical syndrome   5. Long term prescription opiate use   6. Opiate use (30 MME/Day)      Plan of Care  Pharmacotherapy (Medications Ordered): Meds ordered this encounter  Medications  . morphine (MS CONTIN) 15 MG 12 hr tablet    Sig: Take 1 tablet (15 mg total) by mouth every 12 (twelve) hours.    Dispense:  60 tablet    Refill:  0    Do not place this medication, or any other prescription from our  practice, on "Automatic Refill". Patient may have prescription filled one day early if pharmacy is closed on scheduled refill date. Do not fill until: 12/21/16 To last until: 01/20/17  . morphine (MS CONTIN) 15 MG 12 hr tablet    Sig: Take 1 tablet (15 mg total) by mouth every 12 (twelve) hours.    Dispense:  60 tablet    Refill:  0    Do not place this medication, or any other prescription from our practice, on "Automatic Refill". Patient may have prescription filled one day early if pharmacy is closed on scheduled refill date. Do not fill until: 01/20/17 To last until: 02/19/17  . morphine (MS CONTIN) 15 MG 12 hr tablet    Sig: Take 1 tablet (15 mg total) by mouth every 12 (twelve) hours.    Dispense:  60 tablet    Refill:  0    Do not place this medication, or any other prescription from our practice, on "Automatic Refill". Patient may have prescription filled one day early if pharmacy is closed on scheduled refill date. Do not fill until: 02/19/17 To last until: 03/21/17   New Prescriptions   No medications on file   Medications administered today: Mr. Rasmusson had no medications administered during this visit. Lab-work, procedure(s), and/or referral(s): Orders Placed This Encounter  Procedures  . ToxASSURE Select 13 (MW), Urine   Imaging and/or referral(s): None  Interventional therapies: Planned, scheduled, and/or pending:   Not at this time.   Considering:   Diagnostic bilateral lumbar facet block under fluoroscopic guidance and IV sedation. Possible spinal cord stimulator trial.   Palliative PRN treatment(s):   Diagnostic bilateral lumbar facet block under fluoroscopic guidance and IV sedation.   Provider-requested follow-up: Return in about 3 months (around 03/06/2017) for (NP) Med-Mgmt.  Future Appointments Date Time Provider Marks  03/08/2017 9:20 AM Roselee Nova, MD [place] None   Primary Care Physician: Roselee Nova, MD Location: [facility]  Outpatient Pain Management Facility Note by: Kathlen Brunswick. Dossie Arbour, M.D, DABA, DABAPM, DABPM, DABIPP, FIPP Date: 12/07/2016; Time: 9:44 AM  Pain Score Disclaimer: We use the NRS-11 scale. This is a self-reported, subjective measurement of pain severity with only modest accuracy. It is used primarily to identify changes within a particular patient. It must be understood that outpatient pain scales are significantly less accurate that those used  for research, where they can be applied under ideal controlled circumstances with minimal exposure to variables. In reality, the score is likely to be a combination of pain intensity and pain affect, where pain affect describes the degree of emotional arousal or changes in action readiness caused by the sensory experience of pain. Factors such as social and work situation, setting, emotional state, anxiety levels, expectation, and prior pain experience may influence pain perception and show large inter-individual differences that may also be affected by time variables.  Patient instructions provided during this appointment: Patient Instructions  You have been given 3 prescriptions for MS Contin today.

## 2016-12-07 NOTE — Patient Instructions (Signed)
You have been given 3 prescriptions for MS Contin today.

## 2016-12-13 LAB — TOXASSURE SELECT 13 (MW), URINE

## 2017-02-27 ENCOUNTER — Encounter: Payer: Self-pay | Admitting: *Deleted

## 2017-02-28 ENCOUNTER — Encounter: Payer: Self-pay | Admitting: *Deleted

## 2017-02-28 ENCOUNTER — Ambulatory Visit: Payer: Medicare Other | Admitting: *Deleted

## 2017-02-28 ENCOUNTER — Ambulatory Visit
Admission: RE | Admit: 2017-02-28 | Discharge: 2017-02-28 | Disposition: A | Discharge status: Home / Self Care | Payer: Medicare Other | Source: Ambulatory Visit | Attending: Unknown Physician Specialty | Admitting: Unknown Physician Specialty

## 2017-02-28 ENCOUNTER — Encounter
Admission: RE | Disposition: A | Discharge status: Home / Self Care | Payer: Self-pay | Source: Ambulatory Visit | Attending: Unknown Physician Specialty

## 2017-02-28 DIAGNOSIS — Z86010 Personal history of colonic polyps: Secondary | ICD-10-CM | POA: Insufficient documentation

## 2017-02-28 DIAGNOSIS — K640 First degree hemorrhoids: Secondary | ICD-10-CM | POA: Insufficient documentation

## 2017-02-28 DIAGNOSIS — K621 Rectal polyp: Secondary | ICD-10-CM | POA: Insufficient documentation

## 2017-02-28 DIAGNOSIS — I10 Essential (primary) hypertension: Secondary | ICD-10-CM | POA: Insufficient documentation

## 2017-02-28 DIAGNOSIS — K648 Other hemorrhoids: Secondary | ICD-10-CM | POA: Diagnosis not present

## 2017-02-28 DIAGNOSIS — Z79899 Other long term (current) drug therapy: Secondary | ICD-10-CM | POA: Insufficient documentation

## 2017-02-28 DIAGNOSIS — K219 Gastro-esophageal reflux disease without esophagitis: Secondary | ICD-10-CM | POA: Insufficient documentation

## 2017-02-28 DIAGNOSIS — Z7984 Long term (current) use of oral hypoglycemic drugs: Secondary | ICD-10-CM | POA: Diagnosis not present

## 2017-02-28 DIAGNOSIS — D128 Benign neoplasm of rectum: Secondary | ICD-10-CM | POA: Diagnosis not present

## 2017-02-28 DIAGNOSIS — F419 Anxiety disorder, unspecified: Secondary | ICD-10-CM | POA: Insufficient documentation

## 2017-02-28 DIAGNOSIS — G4730 Sleep apnea, unspecified: Secondary | ICD-10-CM | POA: Insufficient documentation

## 2017-02-28 DIAGNOSIS — E119 Type 2 diabetes mellitus without complications: Secondary | ICD-10-CM | POA: Diagnosis not present

## 2017-02-28 DIAGNOSIS — D125 Benign neoplasm of sigmoid colon: Secondary | ICD-10-CM | POA: Insufficient documentation

## 2017-02-28 DIAGNOSIS — E785 Hyperlipidemia, unspecified: Secondary | ICD-10-CM | POA: Diagnosis not present

## 2017-02-28 DIAGNOSIS — M1990 Unspecified osteoarthritis, unspecified site: Secondary | ICD-10-CM | POA: Insufficient documentation

## 2017-02-28 DIAGNOSIS — Z87891 Personal history of nicotine dependence: Secondary | ICD-10-CM | POA: Diagnosis not present

## 2017-02-28 DIAGNOSIS — Z6835 Body mass index (BMI) 35.0-35.9, adult: Secondary | ICD-10-CM | POA: Insufficient documentation

## 2017-02-28 DIAGNOSIS — G709 Myoneural disorder, unspecified: Secondary | ICD-10-CM | POA: Diagnosis not present

## 2017-02-28 DIAGNOSIS — Z1211 Encounter for screening for malignant neoplasm of colon: Secondary | ICD-10-CM | POA: Diagnosis not present

## 2017-02-28 DIAGNOSIS — K635 Polyp of colon: Secondary | ICD-10-CM | POA: Diagnosis not present

## 2017-02-28 DIAGNOSIS — E669 Obesity, unspecified: Secondary | ICD-10-CM | POA: Insufficient documentation

## 2017-02-28 DIAGNOSIS — D120 Benign neoplasm of cecum: Secondary | ICD-10-CM | POA: Diagnosis not present

## 2017-02-28 DIAGNOSIS — K5790 Diverticulosis of intestine, part unspecified, without perforation or abscess without bleeding: Secondary | ICD-10-CM | POA: Diagnosis not present

## 2017-02-28 DIAGNOSIS — Z8371 Family history of colonic polyps: Secondary | ICD-10-CM | POA: Insufficient documentation

## 2017-02-28 DIAGNOSIS — Z7982 Long term (current) use of aspirin: Secondary | ICD-10-CM | POA: Diagnosis not present

## 2017-02-28 HISTORY — PX: COLONOSCOPY WITH PROPOFOL: SHX5780

## 2017-02-28 SURGERY — COLONOSCOPY WITH PROPOFOL
Anesthesia: General

## 2017-02-28 MED ORDER — SODIUM CHLORIDE 0.9 % IV SOLN
INTRAVENOUS | Status: DC
  Administered 2017-02-28: 13:00:00 via INTRAVENOUS

## 2017-02-28 MED ORDER — SODIUM CHLORIDE 0.9 % IV SOLN
INTRAVENOUS | Status: DC
  Administered 2017-02-28: 1000 mL via INTRAVENOUS

## 2017-02-28 MED ORDER — PROPOFOL 500 MG/50ML IV EMUL
INTRAVENOUS | Status: AC
  Filled 2017-02-28: qty 50

## 2017-02-28 MED ORDER — PROPOFOL 10 MG/ML IV BOLUS
INTRAVENOUS | Status: AC
  Filled 2017-02-28: qty 20

## 2017-02-28 MED ORDER — PROPOFOL 500 MG/50ML IV EMUL
INTRAVENOUS | Status: DC | PRN
  Administered 2017-02-28: 200 ug/kg/min via INTRAVENOUS

## 2017-02-28 NOTE — Anesthesia Postprocedure Evaluation (Signed)
Anesthesia Post Note  Patient: Joel Bryant  Procedure(s) Performed: Procedure(s) (LRB): COLONOSCOPY WITH PROPOFOL (N/A)  Patient location during evaluation: PACU Anesthesia Type: General Level of consciousness: awake and alert and oriented Pain management: pain level controlled Vital Signs Assessment: post-procedure vital signs reviewed and stable Respiratory status: spontaneous breathing Cardiovascular status: blood pressure returned to baseline Anesthetic complications: no     Last Vitals:  Vitals:   02/28/17 1342 02/28/17 1350  BP: 121/79 136/82  Pulse: 63 60  Resp: 13 15  Temp:      Last Pain:  Vitals:   02/28/17 1340  TempSrc: Tympanic                 Rachard Isidro

## 2017-02-28 NOTE — H&P (Signed)
Primary Care Physician:  Keith Rake, MD Primary Gastroenterologist:  Dr. Vira Agar  Pre-Procedure History & Physical: HPI:  Joel Bryant is a 56 y.o. male is here for an colonoscopy.   Past Medical History:  Diagnosis Date  . Anxiety   . Chronic LBP 09/13/2015  . Chronic left hip pain 09/27/2015  . Diabetes mellitus without complication ([place])   . GERD (gastroesophageal reflux disease)   . Hyperlipidemia   . Hyperlipidemia   . Hypertension   . Hypomagnesemia     Past Surgical History:  Procedure Laterality Date  . CATARACT EXTRACTION  1992   Insert Prosthetic Lens  . EYE SURGERY    . LAMINECTOMY  2001   lumbar  . TIBIA FRACTURE SURGERY    . TONSILLECTOMY    . WRIST SURGERY Right    x2    Prior to Admission medications   Medication Sig Start Date End Date Taking? Authorizing Provider  aspirin 81 MG tablet Take 81 mg by mouth daily.   Yes Historical Provider, MD  atenolol (TENORMIN) 100 MG tablet Take 1 tablet (100 mg total) by mouth daily. 12/07/16  Yes Roselee Nova, MD  atorvastatin (LIPITOR) 20 MG tablet Take 1 tablet (20 mg total) by mouth daily at 6 PM. 12/07/16  Yes Roselee Nova, MD  Biotin 5000 MCG CAPS Take 5,000 capsules by mouth 2 (two) times daily.   Yes Historical Provider, MD  Cholecalciferol (VITAMIN D3) 2000 units TABS Take 2,000 tablets by mouth daily.   Yes Historical Provider, MD  clonazePAM (KLONOPIN) 0.5 MG tablet Take 1 tablet (0.5 mg total) by mouth at bedtime as needed for anxiety. 12/07/16  Yes Roselee Nova, MD  clonazePAM (KLONOPIN) 1 MG tablet Take 1 tablet (1 mg total) by mouth 2 (two) times daily as needed. 12/07/16  Yes Roselee Nova, MD  diphenhydramine-acetaminophen (TYLENOL PM) 25-500 MG TABS tablet Take 1 tablet by mouth at bedtime as needed.   Yes Historical Provider, MD  esomeprazole (NEXIUM) 40 MG capsule Take 1 capsule (40 mg total) by mouth daily. 12/07/16  Yes Roselee Nova, MD  furosemide (LASIX) 40 MG tablet Take 1 tablet  (40 mg total) by mouth daily. 03/10/16  Yes Roselee Nova, MD  lisinopril (PRINIVIL,ZESTRIL) 2.5 MG tablet Take 1 tablet (2.5 mg total) by mouth daily. 12/07/16  Yes Roselee Nova, MD  Magnesium Oxide 500 MG CAPS Take 1 capsule (500 mg total) by mouth daily. 09/27/15  Yes Milinda Pointer, MD  metFORMIN (GLUCOPHAGE) 1000 MG tablet Take 1 tablet (1,000 mg total) by mouth 2 (two) times daily. 12/07/16  Yes Roselee Nova, MD  morphine (MS CONTIN) 15 MG 12 hr tablet Take 1 tablet (15 mg total) by mouth every 12 (twelve) hours. 02/19/17 03/21/17 Yes Milinda Pointer, MD  omega-3 acid ethyl esters (LOVAZA) 1 g capsule Take 2 capsules (2 g total) by mouth 2 (two) times daily. 12/07/16  Yes Roselee Nova, MD  pregabalin (LYRICA) 50 MG capsule Take 1 capsule (50 mg total) by mouth 3 (three) times daily. 12/07/16  Yes Roselee Nova, MD  sitaGLIPtin (JANUVIA) 50 MG tablet Take 1 tablet (50 mg total) by mouth daily. 12/07/16  Yes Roselee Nova, MD  tiZANidine (ZANAFLEX) 4 MG tablet Take 1 tablet (4 mg total) by mouth daily. 12/07/16  Yes Roselee Nova, MD  morphine (MS CONTIN) 15 MG 12 hr tablet  Take 1 tablet (15 mg total) by mouth every 12 (twelve) hours. 12/21/16 01/20/17  Milinda Pointer, MD  morphine (MS CONTIN) 15 MG 12 hr tablet Take 1 tablet (15 mg total) by mouth every 12 (twelve) hours. 01/20/17 02/19/17  Milinda Pointer, MD  Multiple Vitamins-Minerals (SENTRY SENIOR PO) Take 1 tablet by mouth.    Historical Provider, MD  vitamin B-12 (CYANOCOBALAMIN) 500 MCG tablet Take 500 mcg by mouth daily.    Historical Provider, MD    Allergies as of 01/08/2017  . (No Known Allergies)    Family History  Problem Relation Age of Onset  . Colon polyps Mother   . Retinitis pigmentosa Mother   . Retinitis pigmentosa Maternal Grandmother   . Cancer Maternal Grandfather   . Diabetes Paternal Grandmother   . Alcohol abuse Father     Social History   Social History  . Marital status: Divorced     Spouse name: N/A  . Number of children: N/A  . Years of education: N/A   Occupational History  . Not on file.   Social History Main Topics  . Smoking status: Former Smoker    Types: Cigarettes  . Smokeless tobacco: Never Used     Comment: Quit smoking 4 years now  . Alcohol use 0.0 oz/week     Comment: occassional/seldom  . Drug use: No     Comment: in past used marijuana   . Sexual activity: Not on file   Other Topics Concern  . Not on file   Social History Narrative  . No narrative on file    Review of Systems: See HPI, otherwise negative ROS  Physical Exam: BP (!) 161/85   Pulse 65   Temp 98.2 F (36.8 C) (Tympanic)   Resp 18   Ht 5\' 9"  (1.753 m)   Wt 108.9 kg (240 lb)   SpO2 100%   BMI 35.44 kg/m  General:   Alert,  pleasant and cooperative in NAD Head:  Normocephalic and atraumatic. Neck:  Supple; no masses or thyromegaly. Lungs:  Clear throughout to auscultation.    Heart:  Regular rate and rhythm. Abdomen:  Soft, nontender and nondistended. Normal bowel sounds, without guarding, and without rebound.   Neurologic:  Alert and  oriented x4;  grossly normal neurologically.  Impression/Plan: [name] is here for an colonoscopy to be performed for [facility] colon polyps  Risks, benefits, limitations, and alternatives regarding  colonoscopy have been reviewed with the patient.  Questions have been answered.  All parties agreeable.   Gaylyn Cheers, MD  02/28/2017, 12:55 PM

## 2017-02-28 NOTE — Anesthesia Post-op Follow-up Note (Cosign Needed)
Anesthesia QCDR form completed.        

## 2017-02-28 NOTE — Transfer of Care (Signed)
Immediate Anesthesia Transfer of Care Note  Patient: Joel Bryant  Procedure(s) Performed: Procedure(s): COLONOSCOPY WITH PROPOFOL (N/A)  Patient Location: PACU  Anesthesia Type:General  Level of Consciousness: awake, alert  and oriented  Airway & Oxygen Therapy: Patient Spontanous Breathing and Patient connected to nasal cannula oxygen  Post-op Assessment: Report given to RN and Post -op Vital signs reviewed and stable  Post vital signs: Reviewed and stable  Last Vitals:  Vitals:   02/28/17 1147  BP: (!) 161/85  Pulse: 65  Resp: 18  Temp: 36.8 C    Last Pain:  Vitals:   02/28/17 1147  TempSrc: Tympanic         Complications: No apparent anesthesia complications

## 2017-02-28 NOTE — Anesthesia Preprocedure Evaluation (Signed)
Anesthesia Evaluation  Patient identified by MRN, date of birth, ID band Patient awake    Reviewed: Allergy & Precautions, NPO status   Airway Mallampati: III  TM Distance: <3 FB     Dental  (+) Chipped   Pulmonary sleep apnea , former smoker,    Pulmonary exam normal        Cardiovascular hypertension, Pt. on home beta blockers and Pt. on medications Normal cardiovascular exam     Neuro/Psych PSYCHIATRIC DISORDERS Anxiety  Neuromuscular disease    GI/Hepatic Neg liver ROS, GERD  Medicated,  Endo/Other  diabetes, Type 2, Oral Hypoglycemic Agents  Renal/GU negative Renal ROS     Musculoskeletal  (+) Arthritis , Osteoarthritis,    Abdominal (+) + obese,   Peds negative pediatric ROS (+)  Hematology negative hematology ROS (+)   Anesthesia Other Findings   Reproductive/Obstetrics                             Anesthesia Physical Anesthesia Plan  ASA: II  Anesthesia Plan: General   Post-op Pain Management:    Induction: Intravenous  Airway Management Planned: Nasal Cannula  Additional Equipment:   Intra-op Plan:   Post-operative Plan:   Informed Consent: I have reviewed the patients History and Physical, chart, labs and discussed the procedure including the risks, benefits and alternatives for the proposed anesthesia with the patient or authorized representative who has indicated his/her understanding and acceptance.   Dental advisory given  Plan Discussed with: CRNA and Surgeon  Anesthesia Plan Comments:         Anesthesia Quick Evaluation

## 2017-02-28 NOTE — Op Note (Signed)
[facility] Gastroenterology Patient Name: Joel Bryant Procedure Date: 02/28/2017 12:55 PM MRN: [id number] Account #: [id number] Date of Birth: [birth year] Admit Type: Outpatient Age: 56 Room: [facility] ENDO ROOM 4 Gender: Male Note Status: Finalized Procedure:            Colonoscopy Indications:          High risk colon cancer surveillance: Personal history                        of colonic polyps Providers:            Manya Silvas, MD Referring MD:         Otila Back. Manuella Ghazi (Referring MD) Medicines:            Propofol per Anesthesia Complications:        No immediate complications. Procedure:            Pre-Anesthesia Assessment:                       - After reviewing the risks and benefits, the patient                        was deemed in satisfactory condition to undergo the                        procedure.                       After obtaining informed consent, the colonoscope was                        passed under direct vision. Throughout the procedure,                        the patient's blood pressure, pulse, and oxygen                        saturations were monitored continuously. The                        Colonoscope was introduced through the anus and                        advanced to the the cecum, identified by appendiceal                        orifice and ileocecal valve. The colonoscopy was                        performed without difficulty. The patient tolerated the                        procedure well. The quality of the bowel preparation                        was good. Findings:      A diminutive polyp was found in the cecum. The polyp was sessile. The       polyp was removed with a jumbo cold forceps. Resection and retrieval       were complete.      A diminutive polyp was found in  the cecum. The polyp was sessile. The       polyp was removed with a jumbo cold forceps. Resection and retrieval       were complete.      A small polyp  was found in the cecum. The polyp was sessile. The polyp       was removed with a cold snare. Resection and retrieval were complete.      Two sessile polyps were found in the rectum and sigmoid colon. The       polyps were diminutive in size. These polyps were removed with a cold       snare. Resection and retrieval were complete.      Internal hemorrhoids were found during endoscopy. The hemorrhoids were       small and Grade I (internal hemorrhoids that do not prolapse).      The exam was otherwise without abnormality. Impression:           - One diminutive polyp in the cecum, removed with a                        jumbo cold forceps. Resected and retrieved.                       - One diminutive polyp in the cecum, removed with a                        jumbo cold forceps. Resected and retrieved.                       - One small polyp in the cecum, removed with a cold                        snare. Resected and retrieved.                       - Two diminutive polyps in the rectum and in the                        sigmoid colon, removed with a cold snare. Resected and                        retrieved.                       - Internal hemorrhoids.                       - The examination was otherwise normal. Recommendation:       - Await pathology results. Manya Silvas, MD 02/28/2017 1:38:16 PM This report has been signed electronically. Number of Addenda: 0 Note Initiated On: 02/28/2017 12:55 PM Total Procedure Duration: 0 hours 21 minutes 33 seconds       [facility]

## 2017-03-01 ENCOUNTER — Encounter: Payer: Self-pay | Admitting: Unknown Physician Specialty

## 2017-03-02 LAB — SURGICAL PATHOLOGY

## 2017-03-05 ENCOUNTER — Other Ambulatory Visit: Payer: Self-pay | Admitting: Family Medicine

## 2017-03-05 DIAGNOSIS — E781 Pure hyperglyceridemia: Secondary | ICD-10-CM

## 2017-03-05 DIAGNOSIS — M5416 Radiculopathy, lumbar region: Secondary | ICD-10-CM

## 2017-03-05 DIAGNOSIS — E1140 Type 2 diabetes mellitus with diabetic neuropathy, unspecified: Secondary | ICD-10-CM

## 2017-03-05 DIAGNOSIS — I10 Essential (primary) hypertension: Secondary | ICD-10-CM

## 2017-03-06 ENCOUNTER — Encounter: Payer: Self-pay | Admitting: Family Medicine

## 2017-03-06 ENCOUNTER — Ambulatory Visit (INDEPENDENT_AMBULATORY_CARE_PROVIDER_SITE_OTHER): Payer: Medicare Other | Admitting: Family Medicine

## 2017-03-06 DIAGNOSIS — E1140 Type 2 diabetes mellitus with diabetic neuropathy, unspecified: Secondary | ICD-10-CM

## 2017-03-06 DIAGNOSIS — G4701 Insomnia due to medical condition: Secondary | ICD-10-CM

## 2017-03-06 DIAGNOSIS — G8929 Other chronic pain: Secondary | ICD-10-CM

## 2017-03-06 DIAGNOSIS — F411 Generalized anxiety disorder: Secondary | ICD-10-CM

## 2017-03-06 MED ORDER — PREGABALIN 50 MG PO CAPS: 50.0000 mg | ORAL_CAPSULE | Freq: Three times a day (TID) | ORAL | 0 refills | Status: DC

## 2017-03-06 MED ORDER — CLONAZEPAM 0.5 MG PO TABS: 0.5000 mg | ORAL_TABLET | Freq: Every evening | ORAL | 0 refills | Status: DC | PRN

## 2017-03-06 MED ORDER — CLONAZEPAM 1 MG PO TABS: 1.0000 mg | ORAL_TABLET | Freq: Two times a day (BID) | ORAL | 0 refills | Status: DC | PRN

## 2017-03-06 NOTE — Progress Notes (Signed)
Name: Joel Bryant   MRN: [id number]    DOB: December 30, 1960   Date:03/06/2017       Progress Note  Subjective  Chief Complaint  Chief Complaint  Patient presents with  . Follow-up    3 mo  . Medication Refill    Anxiety  Presents for follow-up visit. Symptoms include excessive worry, insomnia and nervous/anxious behavior. Patient reports no chest pain, panic or shortness of breath. The severity of symptoms is moderate and causing significant distress.    Diabetes  He presents for his follow-up diabetic visit. He has type 2 diabetes mellitus. His disease course has been stable. Hypoglycemia symptoms include nervousness/anxiousness. Associated symptoms include foot paresthesias (resolved with Lyrica. ). Pertinent negatives for diabetes include no chest pain, no polydipsia and no polyuria. Pertinent negatives for diabetic complications include no CVA or heart disease. Risk factors for coronary artery disease include dyslipidemia and obesity. Current diabetic treatment includes oral agent (dual therapy). He is following a diabetic and generally healthy diet. He rarely participates in exercise. He monitors blood glucose at home 1-2 x per week. His breakfast blood glucose range is generally 130-140 mg/dl.     Past Medical History:  Diagnosis Date  . Anxiety   . Chronic LBP 09/13/2015  . Chronic left hip pain 09/27/2015  . Diabetes mellitus without complication ([place])   . GERD (gastroesophageal reflux disease)   . Hyperlipidemia   . Hyperlipidemia   . Hypertension   . Hypomagnesemia     Past Surgical History:  Procedure Laterality Date  . CATARACT EXTRACTION  1992   Insert Prosthetic Lens  . COLONOSCOPY WITH PROPOFOL N/A 02/28/2017   Procedure: COLONOSCOPY WITH PROPOFOL;  Surgeon: Manya Silvas, MD;  Location: [facility] ENDOSCOPY;  Service: Endoscopy;  Laterality: N/A;  . EYE SURGERY    . LAMINECTOMY  2001   lumbar  . TIBIA FRACTURE SURGERY    . TONSILLECTOMY    . WRIST SURGERY Right     x2    Family History  Problem Relation Age of Onset  . Colon polyps Mother   . Retinitis pigmentosa Mother   . Retinitis pigmentosa Maternal Grandmother   . Cancer Maternal Grandfather   . Diabetes Paternal Grandmother   . Alcohol abuse Father     Social History   Social History  . Marital status: Divorced    Spouse name: N/A  . Number of children: N/A  . Years of education: N/A   Occupational History  . Not on file.   Social History Main Topics  . Smoking status: Former Smoker    Types: Cigarettes  . Smokeless tobacco: Never Used     Comment: Quit smoking 4 years now  . Alcohol use 0.0 oz/week     Comment: occassional/seldom  . Drug use: No     Comment: in past used marijuana   . Sexual activity: Not on file   Other Topics Concern  . Not on file   Social History Narrative  . No narrative on file     Current Outpatient Prescriptions:  .  aspirin 81 MG tablet, Take 81 mg by mouth daily., Disp: , Rfl:  .  atenolol (TENORMIN) 100 MG tablet, Take 1 tablet (100 mg total) by mouth daily., Disp: 90 tablet, Rfl: 1 .  atorvastatin (LIPITOR) 20 MG tablet, Take 1 tablet (20 mg total) by mouth daily at 6 PM., Disp: 90 tablet, Rfl: 1 .  Biotin 5000 MCG CAPS, Take 5,000 capsules by mouth 2 (two) times  daily., Disp: , Rfl:  .  Cholecalciferol (VITAMIN D3) 2000 units TABS, Take 2,000 tablets by mouth daily., Disp: , Rfl:  .  clonazePAM (KLONOPIN) 0.5 MG tablet, Take 1 tablet (0.5 mg total) by mouth at bedtime as needed for anxiety., Disp: 90 tablet, Rfl: 0 .  clonazePAM (KLONOPIN) 1 MG tablet, Take 1 tablet (1 mg total) by mouth 2 (two) times daily as needed., Disp: 180 tablet, Rfl: 0 .  diphenhydramine-acetaminophen (TYLENOL PM) 25-500 MG TABS tablet, Take 1 tablet by mouth at bedtime as needed., Disp: , Rfl:  .  esomeprazole (NEXIUM) 40 MG capsule, Take 1 capsule (40 mg total) by mouth daily., Disp: 90 capsule, Rfl: 1 .  furosemide (LASIX) 40 MG tablet, Take 1 tablet (40 mg  total) by mouth daily., Disp: 90 tablet, Rfl: 0 .  lisinopril (PRINIVIL,ZESTRIL) 2.5 MG tablet, Take 1 tablet (2.5 mg total) by mouth daily., Disp: 90 tablet, Rfl: 1 .  Magnesium Oxide 500 MG CAPS, Take 1 capsule (500 mg total) by mouth daily., Disp: 100 capsule, Rfl: PRN .  metFORMIN (GLUCOPHAGE) 1000 MG tablet, Take 1 tablet (1,000 mg total) by mouth 2 (two) times daily., Disp: 180 tablet, Rfl: 1 .  morphine (MS CONTIN) 15 MG 12 hr tablet, Take 1 tablet (15 mg total) by mouth every 12 (twelve) hours., Disp: 60 tablet, Rfl: 0 .  omega-3 acid ethyl esters (LOVAZA) 1 g capsule, Take 2 capsules (2 g total) by mouth 2 (two) times daily., Disp: 360 capsule, Rfl: 1 .  pregabalin (LYRICA) 50 MG capsule, Take 1 capsule (50 mg total) by mouth 3 (three) times daily., Disp: 270 capsule, Rfl: 0 .  sitaGLIPtin (JANUVIA) 50 MG tablet, Take 1 tablet (50 mg total) by mouth daily., Disp: 90 tablet, Rfl: 1 .  tiZANidine (ZANAFLEX) 4 MG tablet, Take 1 tablet (4 mg total) by mouth daily., Disp: 90 tablet, Rfl: 1 .  vitamin B-12 (CYANOCOBALAMIN) 500 MCG tablet, Take 500 mcg by mouth daily., Disp: , Rfl:  .  morphine (MS CONTIN) 15 MG 12 hr tablet, Take 1 tablet (15 mg total) by mouth every 12 (twelve) hours., Disp: 60 tablet, Rfl: 0 .  morphine (MS CONTIN) 15 MG 12 hr tablet, Take 1 tablet (15 mg total) by mouth every 12 (twelve) hours., Disp: 60 tablet, Rfl: 0 .  Multiple Vitamins-Minerals (SENTRY SENIOR PO), Take 1 tablet by mouth., Disp: , Rfl:   No Known Allergies   Review of Systems  Respiratory: Negative for shortness of breath.   Cardiovascular: Negative for chest pain.  Endo/Heme/Allergies: Negative for polydipsia.  Psychiatric/Behavioral: The patient is nervous/anxious and has insomnia.      Objective  Vitals:   03/06/17 0934  BP: 127/68  Pulse: 63  Resp: 16  Temp: 98 F (36.7 C)  TempSrc: Oral  SpO2: 95%  Weight: 227 lb 9.6 oz (103.2 kg)  Height: 5\' 9"  (1.753 m)    Physical Exam   Constitutional: He is oriented to person, place, and time and well-developed, well-nourished, and in no distress.  Cardiovascular: Normal rate, regular rhythm and normal heart sounds.   Pulmonary/Chest: Effort normal and breath sounds normal. He has no wheezes.  Abdominal: Soft. Bowel sounds are normal. There is no tenderness.  Musculoskeletal: Normal range of motion. He exhibits no edema.  Neurological: He is alert and oriented to person, place, and time.  Psychiatric: Mood, memory, affect and judgment normal.  Nursing note and vitals reviewed.    Assessment & Plan  1. Controlled  type 2 diabetes mellitus with diabetic neuropathy, without long-term current use of insulin (HCC) Obtain hemoglobin A1c, continue on Lyrica 3 times daily for peripheral neuropathy - pregabalin (LYRICA) 50 MG capsule; Take 1 capsule (50 mg total) by mouth 3 (three) times daily.  Dispense: 270 capsule; Refill: 0 - HgB A1c  2. Generalized anxiety disorder Stable and responsive to clonazepam taken twice a day when necessary - clonazePAM (KLONOPIN) 1 MG tablet; Take 1 tablet (1 mg total) by mouth 2 (two) times daily as needed.  Dispense: 180 tablet; Refill: 0  3. Insomnia secondary to chronic pain  - clonazePAM (KLONOPIN) 0.5 MG tablet; Take 1 tablet (0.5 mg total) by mouth at bedtime as needed for anxiety.  Dispense: 90 tablet; Refill: 0    [place] [facility] 03/06/2017 9:50 AM

## 2017-03-08 ENCOUNTER — Ambulatory Visit: Payer: Medicare Other | Admitting: Family Medicine

## 2017-03-13 NOTE — Progress Notes (Signed)
Patient's Name: Joel Bryant  MRN: [id number]  Referring Provider: Roselee Nova, MD  DOB: 23-Jul-1961  PCP: Roselee Nova, MD  DOS: 03/15/2017  Note by: Vevelyn Francois NP  Service setting: Ambulatory outpatient  Specialty: Interventional Pain Management  Location: [facility] (AMB) Pain Management Facility    Patient type: Established    Primary Reason(s) for Visit: Encounter for prescription drug management (Level of risk: moderate) CC: Back Pain (lower)  HPI  Joel Bryant is a 55 y.o. year old, male patient, who comes today for a medication management evaluation. He has Anxiety disorder; Hypertriglyceridemia; BP (high blood pressure); Dyslipidemia; Controlled type 2 diabetes mellitus with diabetic neuropathy, without long-term current use of insulin (Joel Bryant); Brash; Insomnia secondary to anxiety; Retinitis pigmentosa; Edema leg; Gout; Obstructive apnea; Diabetes mellitus, type 2 (Joel Bryant); Can't get food down; H/O adenomatous polyp of colon; Leg swelling; Dysmetabolic syndrome; Adiposity; Pneumococcal vaccination given; Post Laminectomy Syndrome (L4-5 Hemilaminectomy and partial Facetectomy); Dietary counseling and surveillance; Long term current use of opiate analgesic; Long term prescription opiate use; Opiate use (30 MME/Day); Encounter for therapeutic drug level monitoring; Encounter for chronic pain management; Lumbar spondylosis; Failed back surgical syndrome; Epidural fibrosis; Chronic low back pain (Location of Primary Source of Pain) (Bilateral) (L>R); Hypomagnesemia; Encounter for long-term (current) use of other high-risk medications; Encounter for opiate analgesic use agreement; Opioid use agreement exists; Chronic left knee pain; Lumbar spinal stenosis (L4-5); 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 His primarily concern today is the Back Pain (lower)  Pain Assessment: Self-Reported Pain Score: 3 /10             Reported level is compatible with observation.       Pain Type: Chronic pain Pain Location: Back Pain Orientation: Lower Pain Descriptors / Indicators: Aching, Sharp, Constant, Discomfort Pain Frequency: Constant  Joel Bryant was last scheduled for an appointment on 12/07/16 for medication management. During today's appointment we reviewed Joel Bryant's chronic pain status, as well as his outpatient medication regimen. He has chronic low back and hip pain. He has bilateral radicular symptoms that goes down into his knees; right greater than left. He states that his hip pain is worse secondary to it "being bone on bone". He states that he has asked about increasing the frequency of his medication or changing his medication . He is aware of the policy. He states that he has been on this regimen for 3 yrs. He feels like he is having breakthrough pain. He does not feel like the mediciation is effective for greater than 6-8 hours. He states that the pain is not bad enough for the interventions as recommended in the past. He admits that he has taken extra medication secondary to a recent tooth extraction . He is going to be out of medication and is going to result into a drug holiday. He feels like the Methadone was the most effective. He would like to get the scar tissue removed from his back.  The patient  reports that he does not use drugs. His body mass index is 33.52 kg/m.  Further details on both, my assessment(s), as well as the proposed treatment plan, please see below.  Controlled Substance Pharmacotherapy Assessment REMS (Risk Evaluation and Mitigation Strategy)  Analgesic:MS Contin (  morphine ER) 15 mg every 12 hours (30 mg/day) MME/day:30 mg/day  Joel Specking, RN   03/15/2017  8:06 AM  Sign at close encounter Nursing Pain Medication Assessment:  Safety precautions to be maintained throughout the outpatient stay will include: orient to surroundings, keep bed in low position, maintain call bell within reach at all times, provide assistance with transfer out of bed and ambulation.  Medication Inspection Compliance: Pill count conducted under aseptic conditions, in front of the patient. Neither the pills nor the bottle was removed from the patient's sight at any time. Once count was completed pills were immediately returned to the patient in their original bottle.  Medication: See above Pill/Patch Count: 6 of 60 pills remain Pill/Patch Appearance: Markings consistent with prescribed medication Bottle Appearance: Standard pharmacy container. Clearly labeled. Filled Date: 04 / 28 / 2018 Last Medication intake:  Today   Pharmacokinetics: Liberation and absorption (onset of action): WNL Distribution (time to peak effect): WNL Metabolism and excretion (duration of action): WNL         Pharmacodynamics: Desired effects: Analgesia: [name] reports >50% benefit. Functional ability: Patient reports that medication allows him to accomplish basic ADLs Clinically meaningful improvement in function (CMIF): Sustained CMIF goals met Perceived effectiveness: Described as relatively effective, allowing for increase in activities of daily living (ADL) Undesirable effects: Side-effects or Adverse reactions: None reported Monitoring: [place] PMP: Online review of the past 56-monthperiod conducted. Compliant with practice rules and regulations List of all UDS test(s) done:  Lab Results  Component Value Date   TOXASSSELUR FINAL 12/07/2016   [place] Center[place]AL 06/07/2016   T[place]garFINAL 03/22/2016   TOXASSSELUR FINAL 12/27/2015   TOXASSSELUR FINAL 09/27/2015   Last UDS on record: ToxAssure Select 13  Date Value Ref Range Status  12/07/2016 FINAL  Final     Comment:    ==================================================================== TOXASSURE SELECT 13 (MW) ==================================================================== Test                             Result       Flag       Units Drug Present and Declared for Prescription Verification   7-aminoclonazepam              265          EXPECTED   ng/mg creat    7-aminoclonazepam is an expected metabolite of clonazepam. Source    of clonazepam is a scheduled prescription medication.   Morphine                       >14706       EXPECTED   ng/mg creat    Potential sources of large amounts of morphine in the absence of    codeine include administration of morphine or use of heroin. ==================================================================== Test                      Result    Flag   Units      Ref Range   Creatinine              68               mg/dL      >=20 ==================================================================== Declared Medications:  The flagging and interpretation on this report are based on the  following declared medications.  Unexpected results may arise from  inaccuracies in the declared medications.  **Note: The  testing scope of this panel includes these medications:  Clonazepam  Morphine (MS Contin)  **Note: The testing scope of this panel does not include following  reported medications:  Acetaminophen (Tylenol PM)  Aspirin  Atenolol  Atorvastatin (Lipitor)  Cyanocobalamin  Diphenhydramine (Tylenol PM)  Furosemide (Lasix)  Lisinopril  Magnesium Oxide  Metformin  Multivitamin  Omega-3 Fatty Acids (Lovaza)  Omeprazole (Nexium)  Pregabalin (Lyrica)  Sitagliptin (Januvia)  Tizanidine (Zanaflex)  Vitamin B (Biotin)  Vitamin D3 ==================================================================== For clinical consultation, please call [phone]. ====================================================================    UDS interpretation:  Compliant          Medication Assessment Form: Reviewed. Patient indicates being compliant with therapy Treatment compliance: Compliant Risk Assessment Profile: Aberrant behavior: See prior evaluations. None observed or detected today Comorbid factors increasing risk of overdose: See prior notes. No additional risks detected today Risk of substance use disorder (SUD): Low Opioid Risk Tool (ORT) Total Score: 12  Interpretation Table:  Score <3 = Low Risk for SUD  Score between 4-7 = Moderate Risk for SUD  Score >8 = High Risk for Opioid Abuse   Risk Mitigation Strategies:  Patient Counseling: Covered Patient-Prescriber Agreement (PPA): Present and active  Notification to other healthcare providers: Done  Pharmacologic Plan: No change in therapy, at this time  Laboratory Chemistry  Inflammation Markers Lab Results  Component Value Date   CRP <0.5 09/27/2015   ESRSEDRATE 4 09/27/2015   (CRP: Acute Phase) (ESR: Chronic Phase) Renal Function Markers Lab Results  Component Value Date   BUN 7 09/07/2016   CREATININE 0.84 09/07/2016   GFRAA >89 09/07/2016   GFRNONAA >89 09/07/2016   Hepatic Function Markers Lab Results  Component Value Date   AST 47 (H) 09/07/2016   ALT 30 09/07/2016   ALBUMIN 4.8 09/07/2016   ALKPHOS 49 09/07/2016   Electrolytes Lab Results  Component Value Date   NA 141 09/07/2016   K 4.6 09/07/2016   CL 101 09/07/2016   CALCIUM 9.6 09/07/2016   MG 1.3 (L) 09/27/2015   Neuropathy Markers No results found for: [id number] Bone Pathology Markers Lab Results  Component Value Date   ALKPHOS 49 09/07/2016   25OHVITD1 17 (L) 09/27/2015   25OHVITD2 <1.0 09/27/2015   25OHVITD3 17 09/27/2015   CALCIUM 9.6 09/07/2016   Coagulation Parameters Lab Results  Component Value Date   PLT 211 11/18/2011   Cardiovascular Markers Lab Results  Component Value Date   HGB 15.9 11/18/2011   HCT 45.8 11/18/2011   Note: Lab results reviewed.  Recent  Diagnostic Imaging Review  No results found. Note: Imaging results reviewed.          Meds  The patient has a current medication list which includes the following prescription(s): amoxicillin, aspirin, atenolol, atorvastatin, biotin, vitamin d3, clonazepam, clonazepam, diphenhydramine-acetaminophen, esomeprazole, furosemide, ibuprofen, lisinopril, magnesium oxide, metformin, multiple vitamins-minerals, omega-3 acid ethyl esters, pregabalin, sitagliptin, tizanidine, vitamin b-12, morphine, morphine, and morphine.  Current Outpatient Prescriptions on File Prior to Visit  Medication Sig  . aspirin 81 MG tablet Take 81 mg by mouth daily.  Marland Kitchen atenolol (TENORMIN) 100 MG tablet Take 1 tablet (100 mg total) by mouth daily.  Marland Kitchen atorvastatin (LIPITOR) 20 MG tablet Take 1 tablet (20 mg total) by mouth daily at 6 PM.  . Biotin 5000 MCG CAPS Take 5,000 capsules by mouth 2 (two) times daily.  . Cholecalciferol (VITAMIN D3) 2000 units TABS Take 2,000 tablets by mouth daily.  . clonazePAM (KLONOPIN) 0.5 MG tablet Take 1 tablet (  0.5 mg total) by mouth at bedtime as needed for anxiety.  . clonazePAM (KLONOPIN) 1 MG tablet Take 1 tablet (1 mg total) by mouth 2 (two) times daily as needed.  . diphenhydramine-acetaminophen (TYLENOL PM) 25-500 MG TABS tablet Take 1 tablet by mouth at bedtime as needed.  Marland Kitchen esomeprazole (NEXIUM) 40 MG capsule Take 1 capsule (40 mg total) by mouth daily.  . furosemide (LASIX) 40 MG tablet Take 1 tablet (40 mg total) by mouth daily.  Marland Kitchen lisinopril (PRINIVIL,ZESTRIL) 2.5 MG tablet Take 1 tablet (2.5 mg total) by mouth daily.  . Magnesium Oxide 500 MG CAPS Take 1 capsule (500 mg total) by mouth daily.  . metFORMIN (GLUCOPHAGE) 1000 MG tablet Take 1 tablet (1,000 mg total) by mouth 2 (two) times daily.  . Multiple Vitamins-Minerals (SENTRY SENIOR PO) Take 1 tablet by mouth.  . omega-3 acid ethyl esters (LOVAZA) 1 g capsule Take 2 capsules (2 g total) by mouth 2 (two) times daily.  .  pregabalin (LYRICA) 50 MG capsule Take 1 capsule (50 mg total) by mouth 3 (three) times daily.  . sitaGLIPtin (JANUVIA) 50 MG tablet Take 1 tablet (50 mg total) by mouth daily.  Marland Kitchen tiZANidine (ZANAFLEX) 4 MG tablet Take 1 tablet (4 mg total) by mouth daily.  . vitamin B-12 (CYANOCOBALAMIN) 500 MCG tablet Take 500 mcg by mouth daily.   No current facility-administered medications on file prior to visit.    ROS  Constitutional: Denies any fever or chills Gastrointestinal: No reported hemesis, hematochezia, vomiting, or acute GI distress Musculoskeletal: Denies any acute onset joint swelling, redness, loss of ROM, or weakness Neurological: No reported episodes of acute onset apraxia, aphasia, dysarthria, agnosia, amnesia, paralysis, loss of coordination, or loss of consciousness  Allergies  Mr. Aument has No Known Allergies.  PFSH  Drug: Mr. Calvey  reports that he does not use drugs. Alcohol:  reports that he drinks alcohol. Tobacco:  reports that he has quit smoking. His smoking use included Cigarettes. He has never used smokeless tobacco. Medical:  has a past medical history of Anxiety; Chronic LBP (09/13/2015); Chronic left hip pain (09/27/2015); Diabetes mellitus without complication ([place]); GERD (gastroesophageal reflux disease); Hyperlipidemia; Hyperlipidemia; Hypertension; and Hypomagnesemia. Family: family history includes Alcohol abuse in his father; Cancer in his maternal grandfather; Colon polyps in his mother; Diabetes in his paternal grandmother; Retinitis pigmentosa in his maternal grandmother and mother.  Past Surgical History:  Procedure Laterality Date  . CATARACT EXTRACTION  1992   Insert Prosthetic Lens  . COLONOSCOPY WITH PROPOFOL N/A 02/28/2017   Procedure: COLONOSCOPY WITH PROPOFOL;  Surgeon: Manya Silvas, MD;  Location: [facility] ENDOSCOPY;  Service: Endoscopy;  Laterality: N/A;  . EYE SURGERY    . LAMINECTOMY  2001   lumbar  . MULTIPLE TOOTH EXTRACTIONS     2018  .  TIBIA FRACTURE SURGERY    . TONSILLECTOMY    . WRIST SURGERY Right    x2   Constitutional Exam  General appearance: Well nourished, well developed, and well hydrated. In no apparent acute distress Vitals:   03/15/17 0758  BP: 135/80  Pulse: 60  Resp: 16  Temp: 98.1 F (36.7 C)  SpO2: 100%  Weight: 227 lb (103 kg)  Height: _0  (1.753 m)   BMI Assessment: Estimated body mass index is 33.52 kg/m as calculated from the following:   Height as of this encounter: _1  (1.753 m).   Weight as of this encounter: 227 lb (103 kg).  BMI interpretation table:  BMI level Category Range association with higher incidence of chronic pain  <18 kg/m2 Underweight   18.5-24.9 kg/m2 Ideal body weight   25-29.9 kg/m2 Overweight Increased incidence by 20%  30-34.9 kg/m2 Obese (Class I) Increased incidence by 68%  35-39.9 kg/m2 Severe obesity (Class II) Increased incidence by 136%  >40 kg/m2 Extreme obesity (Class III) Increased incidence by 254%   BMI Readings from Last 4 Encounters:  03/15/17 33.52 kg/m  03/06/17 33.61 kg/m  02/28/17 35.44 kg/m  12/07/16 34.50 kg/m   Wt Readings from Last 4 Encounters:  03/15/17 227 lb (103 kg)  03/06/17 227 lb 9.6 oz (103.2 kg)  02/28/17 240 lb (108.9 kg)  12/07/16 233 lb 9.6 oz (106 kg)  Psych/Mental status: Alert, oriented x 3 (person, place, & time)       Eyes: PERLA Respiratory: No evidence of acute respiratory distress  Lumbar Spine Exam  Inspection: Well healed scar from previous spine surgery detected Alignment: Symmetrical Functional ROM: Unrestricted ROM      Stability: No instability detected Muscle strength & Tone: Functionally intact Sensory: Unimpaired Palpation: Complains of area being tender to palpation       Provocative Tests: Lumbar Hyperextension and rotation test: Positive on the right for facet joint pain. Patrick's Maneuver: evaluation deferred today                    Gait & Posture Assessment  Ambulation:  Unassisted Gait: Relatively normal for age and body habitus Posture: WNL   Lower Extremity Exam    Side: Right lower extremity  Side: Left lower extremity  Inspection: No masses, redness, swelling, or asymmetry. No contractures  Inspection: No masses, redness, swelling, or asymmetry. No contractures  Functional ROM: Unrestricted ROM          Functional ROM: Unrestricted ROM          Muscle strength & Tone: Functionally intact  Muscle strength & Tone: Functionally intact  Sensory: Unimpaired  Sensory: Unimpaired  Palpation: No palpable anomalies  Palpation: No palpable anomalies   Assessment  Primary Diagnosis & Pertinent Problem List: The primary encounter diagnosis was Lumbar spondylosis. 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  Status Diagnosis  Controlled Controlled Controlled 1. Lumbar spondylosis   2. Chronic pain syndrome   3. Chronic hip pain, unspecified laterality   4. 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 Lumbar facet syndrome (Bilateral)     Problems updated and reviewed during this visit: No problems updated. Plan of Care  Pharmacotherapy (Medications Ordered): Meds ordered this encounter  Medications  . morphine (MS CONTIN) 15 MG 12 hr tablet    Sig: Take 1 tablet (15 mg total) by mouth every 12 (twelve) hours.    Dispense:  60 tablet    Refill:  0    Do not place this medication, or any other prescription from our practice, on "Automatic Refill". Patient may have prescription filled one day early if pharmacy is closed on scheduled refill date. Do not fill until: 05/20/17 To last until: 06/19/17    Order Specific Question:   Supervising Provider    Answer:   Milinda Pointer [phone]  . morphine (MS CONTIN) 15 MG 12 hr tablet    Sig: Take 1  tablet (15 mg total) by mouth every 12 (twelve) hours.    Dispense:  60 tablet  Refill:  0    Do not place this medication, or any other prescription from our practice, on "Automatic Refill". Patient may have prescription filled one day early if pharmacy is closed on scheduled refill date. Do not fill until: 03/21/17 To last until: 04/20/17    Order Specific Question:   Supervising Provider    Answer:   Milinda Pointer [phone]  . morphine (MS CONTIN) 15 MG 12 hr tablet    Sig: Take 1 tablet (15 mg total) by mouth every 12 (twelve) hours.    Dispense:  60 tablet    Refill:  0    Do not place this medication, or any other prescription from our practice, on "Automatic Refill". Patient may have prescription filled one day early if pharmacy is closed on scheduled refill date. Do not fill until: 04/20/17 To last until: 05/20/17    Order Specific Question:   Supervising Provider    Answer:   Milinda Pointer [phone]   New Prescriptions   No medications on file   Medications administered today: Mr. Ballon had no medications administered during this visit. Lab-work, procedure(s), and/or referral(s): No orders of the defined types were placed in this encounter.  Imaging and/or referral(s): None  Interventional therapies: Planned, scheduled, and/or pending:   Consider surgery referral for evaluation of stenosis and scar tissue. Continue with current regimen.    Considering:   Diagnostic bilateral lumbar facet block Possible spinal cord stimulator trial.   Palliative PRN treatment(s):   Diagnostic bilateral lumbar facet block   Provider-requested follow-up: Return in about 3 months (around 06/15/2017) for Medication Mgmt.  Future Appointments Date Time Provider [place]  06/06/2017 10:00 AM Roselee Nova, MD [place] None  06/14/2017 8:00 AM Vevelyn Francois, NP [facility] None   Primary Care Physician: Roselee Nova, MD Location: [facility] Outpatient Pain  Management Facility Note by: Vevelyn Francois NP Date: 03/15/2017; Time: 8:51 AM  Pain Score Disclaimer: We use the NRS-11 scale. This is a self-reported, subjective measurement of pain severity with only modest accuracy. It is used primarily to identify changes within a particular patient. It must be understood that outpatient pain scales are significantly less accurate that those used for research, where they can be applied under ideal controlled circumstances with minimal exposure to variables. In reality, the score is likely to be a combination of pain intensity and pain affect, where pain affect describes the degree of emotional arousal or changes in action readiness caused by the sensory experience of pain. Factors such as social and work situation, setting, emotional state, anxiety levels, expectation, and prior pain experience may influence pain perception and show large inter-individual differences that may also be affected by time variables.  Patient instructions provided during this appointment: Patient Instructions  ____________________________________________________________________________________________  Medication Rules  Applies to: All patients receiving prescriptions (written or electronic).  Pharmacy of record: Pharmacy where electronic prescriptions will be sent. If written prescriptions are taken to a different pharmacy, please inform the nursing staff. The pharmacy listed in the electronic medical record should be the one where you would like electronic prescriptions to be sent.  Prescription refills: Only during scheduled appointments. Applies to both, written and electronic prescriptions.  NOTE: The following applies primarily to controlled substances (Opioid Pain Medications)  Patient's responsibilities: 1. Pain Pills: Bring all pain pills to every appointment (except for procedure appointments). 2. Pill Bottles: Bring pills in original pharmacy bottle. Always bring  newest bottle. Bring bottle, even if empty. 3.  Medication refills: You are responsible for knowing and keeping track of what medications you need refilled. The day before your appointment, write a list of all prescriptions that need to be refilled. Bring that list to your appointment and give it to the admitting nurse. Prescriptions will be written only during appointments. If you forget a medication, it will not be "Called in", "Faxed", or "electronically sent". You will need to get another appointment to get these prescribed. 4. Prescription Accuracy: You are responsible for carefully inspecting your prescriptions before leaving our office. Have the discharge nurse carefully go over each prescription with you, before taking them home. Make sure that your name is accurately spelled, that your address is correct. Check the name and dose of your medication to make sure it is accurate. Check the number of pills, and the written instructions to make sure they are clear and accurate. Make sure that you are given enough medication to last until your next medication refill appointment. 5. Taking Medication: Take medication as prescribed. Never take more pills than instructed. Never take medication more frequently than prescribed. Taking less pills or less frequently is permitted and encouraged, when it comes to controlled substances (written prescriptions).  6. Inform other Doctors: Always inform, all of your healthcare providers, of all the medications you take. 7. Pain Medication from other Providers: You are not allowed to accept any additional pain medication from any other Doctor or Healthcare provider. There are two exceptions to this rule. (see below) In the event that you require additional pain medication, you are responsible for notifying us, as stated below. 8. Medication Agreement: You are responsible for carefully reading and following our Medication Agreement. This must be signed before receiving any  prescriptions from our practice. Safely store a copy of your signed Agreement. Violations to the Agreement will result in no further prescriptions. (Additional copies of our Medication Agreement are available upon request.) 9. Laws, Rules, & Regulations: All patients are expected to follow all Federal and [employer], Rules, [employer]. Ignorance of the Laws does not constitute a valid excuse.  Exceptions: There are only two exceptions to the rule of not receiving pain medications from other Healthcare Providers. 1. Exception #1 (Emergencies): In the event of an emergency (i.e.: accident requiring emergency care), you are allowed to receive additional pain medication. However, you are responsible for: As soon as you are able, call our office [phone], at any time of the day or night, and leave a message stating your name, the date and nature of the emergency, and the name and dose of the medication prescribed. In the event that your call is answered by a member of our staff, make sure to document and save the date, time, and the name of the person that took your information.  2. Exception #2 (Planned Surgery): In the event that you are scheduled by another doctor or dentist to have any type of surgery or procedure, you are allowed (for a period no longer than 30 days), to receive additional pain medication, for the acute post-op pain. However, in this case, you are responsible for picking up a copy of our "Post-op Pain Management for Surgeons" handout, and giving it to your surgeon or dentist. This document is available at our office, and does not require an appointment to obtain it. Simply go to our office during business hours (Monday-Thursday from 8:00 AM to 4:00 PM) (Friday 8:00 AM to 12:00 Noon) or if you have a scheduled appointment with  [place], prior to your surgery, and ask for it by name. In addition, you will need to provide [place] with your name, name of your surgeon, type of surgery, and  date of procedure or surgery.  _____________________________________________________________________________________________  Pain Management Discharge Instructions  General Discharge Instructions :  If you need to reach your doctor call: Monday-Friday 8:00 am - 4:00 pm at [phone] or toll free [phone].  After clinic hours [phone] to have operator reach doctor.  Bring all of your medication bottles to all your appointments in the pain clinic.  To cancel or reschedule your appointment with Pain Management please remember to call 24 hours in advance to avoid a fee.  Refer to the educational materials which you have been given on: General Risks, I had my Procedure. Discharge Instructions, Post Sedation.  Post Procedure Instructions:  The drugs you were given will stay in your system until tomorrow, so for the next 24 hours you should not drive, make any legal decisions or drink any alcoholic beverages.  You may eat anything you prefer, but it is better to start with liquids then soups and crackers, and gradually work up to solid foods.  Please notify your doctor immediately if you have any unusual bleeding, trouble breathing or pain that is not related to your normal pain.  Depending on the type of procedure that was done, some parts of your body may feel week and/or numb.  This usually clears up by tonight or the next day.  Walk with the use of an assistive device or accompanied by an adult for the 24 hours.  You may use ice on the affected area for the first 24 hours.  Put ice in a Ziploc bag and cover with a towel and place against area 15 minutes on 15 minutes off.  You may switch to heat after 24 hours.

## 2017-03-15 ENCOUNTER — Encounter: Payer: Self-pay | Admitting: Nurse Practitioner

## 2017-03-15 ENCOUNTER — Ambulatory Visit: Payer: Medicare Other | Attending: Nurse Practitioner | Admitting: Nurse Practitioner

## 2017-03-15 VITALS — BP 135/80 | HR 60 | Temp 98.1°F | Resp 16 | Ht 69.0 in | Wt 227.0 lb

## 2017-03-15 DIAGNOSIS — Z7984 Long term (current) use of oral hypoglycemic drugs: Secondary | ICD-10-CM | POA: Insufficient documentation

## 2017-03-15 DIAGNOSIS — E785 Hyperlipidemia, unspecified: Secondary | ICD-10-CM | POA: Diagnosis not present

## 2017-03-15 DIAGNOSIS — E1140 Type 2 diabetes mellitus with diabetic neuropathy, unspecified: Secondary | ICD-10-CM | POA: Diagnosis not present

## 2017-03-15 DIAGNOSIS — I10 Essential (primary) hypertension: Secondary | ICD-10-CM | POA: Insufficient documentation

## 2017-03-15 DIAGNOSIS — M25559 Pain in unspecified hip: Secondary | ICD-10-CM

## 2017-03-15 DIAGNOSIS — M25552 Pain in left hip: Secondary | ICD-10-CM | POA: Insufficient documentation

## 2017-03-15 DIAGNOSIS — M47816 Spondylosis without myelopathy or radiculopathy, lumbar region: Secondary | ICD-10-CM

## 2017-03-15 DIAGNOSIS — M47896 Other spondylosis, lumbar region: Secondary | ICD-10-CM | POA: Insufficient documentation

## 2017-03-15 DIAGNOSIS — Z79891 Long term (current) use of opiate analgesic: Secondary | ICD-10-CM | POA: Insufficient documentation

## 2017-03-15 DIAGNOSIS — Z5181 Encounter for therapeutic drug level monitoring: Secondary | ICD-10-CM | POA: Diagnosis not present

## 2017-03-15 DIAGNOSIS — K219 Gastro-esophageal reflux disease without esophagitis: Secondary | ICD-10-CM | POA: Diagnosis not present

## 2017-03-15 DIAGNOSIS — F419 Anxiety disorder, unspecified: Secondary | ICD-10-CM | POA: Insufficient documentation

## 2017-03-15 DIAGNOSIS — M4696 Unspecified inflammatory spondylopathy, lumbar region: Secondary | ICD-10-CM | POA: Diagnosis not present

## 2017-03-15 DIAGNOSIS — Z79899 Other long term (current) drug therapy: Secondary | ICD-10-CM | POA: Diagnosis not present

## 2017-03-15 DIAGNOSIS — M961 Postlaminectomy syndrome, not elsewhere classified: Secondary | ICD-10-CM | POA: Insufficient documentation

## 2017-03-15 DIAGNOSIS — Z87891 Personal history of nicotine dependence: Secondary | ICD-10-CM | POA: Diagnosis not present

## 2017-03-15 DIAGNOSIS — G894 Chronic pain syndrome: Secondary | ICD-10-CM | POA: Insufficient documentation

## 2017-03-15 DIAGNOSIS — Z7982 Long term (current) use of aspirin: Secondary | ICD-10-CM | POA: Diagnosis not present

## 2017-03-15 DIAGNOSIS — G8929 Other chronic pain: Secondary | ICD-10-CM

## 2017-03-15 MED ORDER — MORPHINE SULFATE ER 15 MG PO TBCR: 15.0000 mg | EXTENDED_RELEASE_TABLET | Freq: Two times a day (BID) | ORAL | 0 refills | Status: DC

## 2017-03-15 NOTE — Patient Instructions (Addendum)
____________________________________________________________________________________________  Medication Rules  Applies to: All patients receiving prescriptions (written or electronic).  Pharmacy of record: Pharmacy where electronic prescriptions will be sent. If written prescriptions are taken to a different pharmacy, please inform the nursing staff. The pharmacy listed in the electronic medical record should be the one where you would like electronic prescriptions to be sent.  Prescription refills: Only during scheduled appointments. Applies to both, written and electronic prescriptions.  NOTE: The following applies primarily to controlled substances (Opioid Pain Medications)  Patient's responsibilities: 1. Pain Pills: Bring all pain pills to every appointment (except for procedure appointments). 2. Pill Bottles: Bring pills in original pharmacy bottle. Always bring newest bottle. Bring bottle, even if empty. 3. Medication refills: You are responsible for knowing and keeping track of what medications you need refilled. The day before your appointment, write a list of all prescriptions that need to be refilled. Bring that list to your appointment and give it to the admitting nurse. Prescriptions will be written only during appointments. If you forget a medication, it will not be "Called in", "Faxed", or "electronically sent". You will need to get another appointment to get these prescribed. 4. Prescription Accuracy: You are responsible for carefully inspecting your prescriptions before leaving our office. Have the discharge nurse carefully go over each prescription with you, before taking them home. Make sure that your name is accurately spelled, that your address is correct. Check the name and dose of your medication to make sure it is accurate. Check the number of pills, and the written instructions to make sure they are clear and accurate. Make sure that you are given enough medication to last  until your next medication refill appointment. 5. Taking Medication: Take medication as prescribed. Never take more pills than instructed. Never take medication more frequently than prescribed. Taking less pills or less frequently is permitted and encouraged, when it comes to controlled substances (written prescriptions).  6. Inform other Doctors: Always inform, all of your healthcare providers, of all the medications you take. 7. Pain Medication from other Providers: You are not allowed to accept any additional pain medication from any other Doctor or Healthcare provider. There are two exceptions to this rule. (see below) In the event that you require additional pain medication, you are responsible for notifying us, as stated below. 8. Medication Agreement: You are responsible for carefully reading and following our Medication Agreement. This must be signed before receiving any prescriptions from our practice. Safely store a copy of your signed Agreement. Violations to the Agreement will result in no further prescriptions. (Additional copies of our Medication Agreement are available upon request.) 9. Laws, Rules, & Regulations: All patients are expected to follow all Federal and State Laws, Statutes, Rules, & Regulations. Ignorance of the Laws does not constitute a valid excuse.  Exceptions: There are only two exceptions to the rule of not receiving pain medications from other Healthcare Providers. 1. Exception #1 (Emergencies): In the event of an emergency (i.e.: accident requiring emergency care), you are allowed to receive additional pain medication. However, you are responsible for: As soon as you are able, call our office [phone], at any time of the day or night, and leave a message stating your name, the date and nature of the emergency, and the name and dose of the medication prescribed. In the event that your call is answered by a member of our staff, make sure to document and save the date,  time, and the name of the person that   took your information.  2. Exception #2 (Planned Surgery): In the event that you are scheduled by another doctor or dentist to have any type of surgery or procedure, you are allowed (for a period no longer than 30 days), to receive additional pain medication, for the acute post-op pain. However, in this case, you are responsible for picking up a copy of our "Post-op Pain Management for Surgeons" handout, and giving it to your surgeon or dentist. This document is available at our office, and does not require an appointment to obtain it. Simply go to our office during business hours (Monday-Thursday from 8:00 AM to 4:00 PM) (Friday 8:00 AM to 12:00 Noon) or if you have a scheduled appointment with us, prior to your surgery, and ask for it by name. In addition, you will need to provide us with your name, name of your surgeon, type of surgery, and date of procedure or surgery. Pain Management Discharge Instructions  General Discharge Instructions :  If you need to reach your doctor call: Monday-Friday 8:00 am - 4:00 pm at [phone] or toll free [phone].  After clinic hours [phone] to have operator reach doctor.  Bring all of your medication bottles to all your appointments in the pain clinic.  To cancel or reschedule your appointment with Pain Management please remember to call 24 hours in advance to avoid a fee.  Refer to the educational materials which you have been given on: General Risks, I had my Procedure. Discharge Instructions, Post Sedation.  Post Procedure Instructions:  The drugs you were given will stay in your system until tomorrow, so for the next 24 hours you should not drive, make any legal decisions or drink any alcoholic beverages.  You may eat anything you prefer, but it is better to start with liquids then soups and crackers, and gradually work up to solid foods.  Please notify your doctor immediately if you have any unusual  bleeding, trouble breathing or pain that is not related to your normal pain.  Depending on the type of procedure that was done, some parts of your body may feel week and/or numb.  This usually clears up by tonight or the next day.  Walk with the use of an assistive device or accompanied by an adult for the 24 hours.  You may use ice on the affected area for the first 24 hours.  Put ice in a Ziploc bag and cover with a towel and place against area 15 minutes on 15 minutes off.  You may switch to heat after 24 hours. 

## 2017-03-15 NOTE — Progress Notes (Signed)
Nursing Pain Medication Assessment:  Safety precautions to be maintained throughout the outpatient stay will include: orient to surroundings, keep bed in low position, maintain call bell within reach at all times, provide assistance with transfer out of bed and ambulation.  Medication Inspection Compliance: Pill count conducted under aseptic conditions, in front of the patient. Neither the pills nor the bottle was removed from the patient's sight at any time. Once count was completed pills were immediately returned to the patient in their original bottle.  Medication: See above Pill/Patch Count: 6 of 60 pills remain Pill/Patch Appearance: Markings consistent with prescribed medication Bottle Appearance: Standard pharmacy container. Clearly labeled. Filled Date: 04 / 28 / 2018 Last Medication intake:  Today

## 2017-04-02 ENCOUNTER — Telehealth: Payer: Self-pay | Admitting: Family Medicine

## 2017-04-17 ENCOUNTER — Other Ambulatory Visit: Payer: Self-pay | Admitting: Family Medicine

## 2017-04-17 DIAGNOSIS — R600 Localized edema: Secondary | ICD-10-CM

## 2017-06-06 ENCOUNTER — Ambulatory Visit: Payer: Medicare Other | Admitting: Family Medicine

## 2017-06-12 ENCOUNTER — Encounter: Payer: Self-pay | Admitting: Family Medicine

## 2017-06-12 ENCOUNTER — Other Ambulatory Visit: Payer: Self-pay | Admitting: Family Medicine

## 2017-06-12 ENCOUNTER — Ambulatory Visit (INDEPENDENT_AMBULATORY_CARE_PROVIDER_SITE_OTHER): Payer: Medicare Other | Admitting: Family Medicine

## 2017-06-12 VITALS — BP 131/73 | HR 61 | Temp 98.5°F | Resp 16 | Ht 69.0 in | Wt 232.3 lb

## 2017-06-12 DIAGNOSIS — G8929 Other chronic pain: Secondary | ICD-10-CM

## 2017-06-12 DIAGNOSIS — F411 Generalized anxiety disorder: Secondary | ICD-10-CM | POA: Diagnosis not present

## 2017-06-12 DIAGNOSIS — I10 Essential (primary) hypertension: Secondary | ICD-10-CM

## 2017-06-12 DIAGNOSIS — E785 Hyperlipidemia, unspecified: Secondary | ICD-10-CM | POA: Diagnosis not present

## 2017-06-12 DIAGNOSIS — E781 Pure hyperglyceridemia: Secondary | ICD-10-CM

## 2017-06-12 DIAGNOSIS — E1140 Type 2 diabetes mellitus with diabetic neuropathy, unspecified: Secondary | ICD-10-CM | POA: Diagnosis not present

## 2017-06-12 DIAGNOSIS — M5416 Radiculopathy, lumbar region: Secondary | ICD-10-CM

## 2017-06-12 DIAGNOSIS — K219 Gastro-esophageal reflux disease without esophagitis: Secondary | ICD-10-CM | POA: Diagnosis not present

## 2017-06-12 DIAGNOSIS — G4701 Insomnia due to medical condition: Secondary | ICD-10-CM | POA: Diagnosis not present

## 2017-06-12 LAB — GLUCOSE, POCT (MANUAL RESULT ENTRY): POC Glucose: 155 mg/dl — AB (ref 70–99)

## 2017-06-12 LAB — POCT GLYCOSYLATED HEMOGLOBIN (HGB A1C): Hemoglobin A1C: 5.5

## 2017-06-12 MED ORDER — ESOMEPRAZOLE MAGNESIUM 40 MG PO CPDR: 40.0000 mg | DELAYED_RELEASE_CAPSULE | Freq: Every day | ORAL | 1 refills | Status: DC

## 2017-06-12 MED ORDER — METFORMIN HCL 1000 MG PO TABS: 1000.0000 mg | ORAL_TABLET | Freq: Two times a day (BID) | ORAL | 1 refills | Status: DC

## 2017-06-12 MED ORDER — LISINOPRIL 2.5 MG PO TABS: 2.5000 mg | ORAL_TABLET | Freq: Every day | ORAL | 1 refills | Status: DC

## 2017-06-12 MED ORDER — CLONAZEPAM 1 MG PO TABS: 1.0000 mg | ORAL_TABLET | Freq: Two times a day (BID) | ORAL | 0 refills | Status: DC | PRN

## 2017-06-12 MED ORDER — ATENOLOL 100 MG PO TABS: 100.0000 mg | ORAL_TABLET | Freq: Every day | ORAL | 1 refills | Status: DC

## 2017-06-12 MED ORDER — PREGABALIN 50 MG PO CAPS: 50.0000 mg | ORAL_CAPSULE | Freq: Three times a day (TID) | ORAL | 0 refills | Status: DC

## 2017-06-12 MED ORDER — SITAGLIPTIN PHOSPHATE 50 MG PO TABS: 50.0000 mg | ORAL_TABLET | Freq: Every day | ORAL | 1 refills | Status: DC

## 2017-06-12 MED ORDER — ATORVASTATIN CALCIUM 20 MG PO TABS: 20.0000 mg | ORAL_TABLET | Freq: Every day | ORAL | 1 refills | Status: DC

## 2017-06-12 MED ORDER — CLONAZEPAM 0.5 MG PO TABS: 0.5000 mg | ORAL_TABLET | Freq: Every evening | ORAL | 0 refills | Status: DC | PRN

## 2017-06-12 NOTE — Progress Notes (Signed)
Name: Joel Bryant   MRN: [id number]    DOB: 1961/05/18   Date:06/12/2017       Progress Note  Subjective  Chief Complaint  Chief Complaint  Patient presents with  . Follow-up    3 mo  . Medication Refill  . Diabetes  . Hyperlipidemia    Diabetes  He presents for his follow-up diabetic visit. He has type 2 diabetes mellitus. His disease course has been stable. Pertinent negatives for hypoglycemia include no dizziness, headaches or nervousness/anxiousness. Pertinent negatives for diabetes include no blurred vision, no chest pain, no fatigue, no foot paresthesias, no polydipsia and no polyuria. Pertinent negatives for diabetic complications include no CVA. Current diabetic treatment includes oral agent (dual therapy). He is following a generally healthy diet. He monitors blood glucose at home 1-2 x per week. His breakfast blood glucose range is generally 110-130 mg/dl. An ACE inhibitor/angiotensin II receptor blocker is being taken. Eye exam is current.  Hyperlipidemia  This is a chronic problem. The problem is controlled. Recent lipid tests were reviewed and are normal. Pertinent negatives include no chest pain, leg pain, myalgias or shortness of breath. Current antihyperlipidemic treatment includes statins.  Hypertension  This is a chronic problem. The problem is unchanged. The problem is controlled. Pertinent negatives include no anxiety, blurred vision, chest pain, headaches, palpitations or shortness of breath. Past treatments include beta blockers. There is no history of kidney disease, CAD/MI or CVA.  Anxiety  Presents for follow-up visit. Patient reports no chest pain, depressed mood, dizziness, excessive worry, insomnia, irritability, nervous/anxious behavior, palpitations or shortness of breath. The severity of symptoms is moderate.      Past Medical History:  Diagnosis Date  . Anxiety   . Chronic LBP 09/13/2015  . Chronic left hip pain 09/27/2015  . Diabetes mellitus without  complication ([place])   . GERD (gastroesophageal reflux disease)   . Hyperlipidemia   . Hyperlipidemia   . Hypertension   . Hypomagnesemia     Past Surgical History:  Procedure Laterality Date  . CATARACT EXTRACTION  1992   Insert Prosthetic Lens  . COLONOSCOPY WITH PROPOFOL N/A 02/28/2017   Procedure: COLONOSCOPY WITH PROPOFOL;  Surgeon: Manya Silvas, MD;  Location: [facility] ENDOSCOPY;  Service: Endoscopy;  Laterality: N/A;  . EYE SURGERY    . LAMINECTOMY  2001   lumbar  . MULTIPLE TOOTH EXTRACTIONS     2018  . TIBIA FRACTURE SURGERY    . TONSILLECTOMY    . WRIST SURGERY Right    x2    Family History  Problem Relation Age of Onset  . Colon polyps Mother   . Retinitis pigmentosa Mother   . Retinitis pigmentosa Maternal Grandmother   . Cancer Maternal Grandfather   . Diabetes Paternal Grandmother   . Alcohol abuse Father     Social History   Social History  . Marital status: Divorced    Spouse name: N/A  . Number of children: N/A  . Years of education: N/A   Occupational History  . Not on file.   Social History Main Topics  . Smoking status: Former Smoker    Types: Cigarettes  . Smokeless tobacco: Never Used     Comment: Quit smoking 4 years now  . Alcohol use 0.0 oz/week     Comment: occassional/seldom  . Drug use: No     Comment: in past used marijuana   . Sexual activity: Not on file   Other Topics Concern  . Not on file  Social History Narrative  . No narrative on file     Current Outpatient Prescriptions:  .  aspirin 81 MG tablet, Take 81 mg by mouth daily., Disp: , Rfl:  .  atenolol (TENORMIN) 100 MG tablet, Take 1 tablet (100 mg total) by mouth daily., Disp: 90 tablet, Rfl: 1 .  atorvastatin (LIPITOR) 20 MG tablet, Take 1 tablet (20 mg total) by mouth daily at 6 PM., Disp: 90 tablet, Rfl: 1 .  Biotin 5000 MCG CAPS, Take 5,000 capsules by mouth 2 (two) times daily., Disp: , Rfl:  .  Cholecalciferol (VITAMIN D3) 2000 units TABS, Take 2,000 tablets  by mouth daily., Disp: , Rfl:  .  clonazePAM (KLONOPIN) 0.5 MG tablet, Take 1 tablet (0.5 mg total) by mouth at bedtime as needed for anxiety., Disp: 90 tablet, Rfl: 0 .  clonazePAM (KLONOPIN) 1 MG tablet, Take 1 tablet (1 mg total) by mouth 2 (two) times daily as needed., Disp: 180 tablet, Rfl: 0 .  diphenhydramine-acetaminophen (TYLENOL PM) 25-500 MG TABS tablet, Take 1 tablet by mouth at bedtime as needed., Disp: , Rfl:  .  esomeprazole (NEXIUM) 40 MG capsule, Take 1 capsule (40 mg total) by mouth daily., Disp: 90 capsule, Rfl: 1 .  furosemide (LASIX) 40 MG tablet, TAKE 1 TABLET BY MOUTH ONCE DAILY, Disp: 90 tablet, Rfl: 0 .  ibuprofen (ADVIL,MOTRIN) 800 MG tablet, Take 800 mg by mouth every 8 (eight) hours as needed., Disp: , Rfl:  .  lisinopril (PRINIVIL,ZESTRIL) 2.5 MG tablet, Take 1 tablet (2.5 mg total) by mouth daily., Disp: 90 tablet, Rfl: 1 .  metFORMIN (GLUCOPHAGE) 1000 MG tablet, Take 1 tablet (1,000 mg total) by mouth 2 (two) times daily., Disp: 180 tablet, Rfl: 1 .  morphine (MS CONTIN) 15 MG 12 hr tablet, Take 1 tablet (15 mg total) by mouth every 12 (twelve) hours., Disp: 60 tablet, Rfl: 0 .  omega-3 acid ethyl esters (LOVAZA) 1 g capsule, Take 2 capsules (2 g total) by mouth 2 (two) times daily., Disp: 360 capsule, Rfl: 1 .  pregabalin (LYRICA) 50 MG capsule, Take 1 capsule (50 mg total) by mouth 3 (three) times daily., Disp: 270 capsule, Rfl: 0 .  sitaGLIPtin (JANUVIA) 50 MG tablet, Take 1 tablet (50 mg total) by mouth daily., Disp: 90 tablet, Rfl: 1 .  tiZANidine (ZANAFLEX) 4 MG tablet, Take 1 tablet (4 mg total) by mouth daily., Disp: 90 tablet, Rfl: 1 .  amoxicillin (AMOXIL) 500 MG capsule, Take 500 mg by mouth 3 (three) times daily., Disp: , Rfl:  .  Magnesium Oxide 500 MG CAPS, Take 1 capsule (500 mg total) by mouth daily. (Patient not taking: Reported on 06/12/2017), Disp: 100 capsule, Rfl: PRN .  morphine (MS CONTIN) 15 MG 12 hr tablet, Take 1 tablet (15 mg total) by mouth  every 12 (twelve) hours., Disp: 60 tablet, Rfl: 0 .  morphine (MS CONTIN) 15 MG 12 hr tablet, Take 1 tablet (15 mg total) by mouth every 12 (twelve) hours., Disp: 60 tablet, Rfl: 0 .  Multiple Vitamins-Minerals (SENTRY SENIOR PO), Take 1 tablet by mouth., Disp: , Rfl:  .  vitamin B-12 (CYANOCOBALAMIN) 500 MCG tablet, Take 500 mcg by mouth daily., Disp: , Rfl:   No Known Allergies   Review of Systems  Constitutional: Negative for fatigue and irritability.  Eyes: Negative for blurred vision.  Respiratory: Negative for shortness of breath.   Cardiovascular: Negative for chest pain and palpitations.  Musculoskeletal: Negative for myalgias.  Neurological: Negative for  dizziness and headaches.  Endo/Heme/Allergies: Negative for polydipsia.  Psychiatric/Behavioral: The patient is not nervous/anxious and does not have insomnia.      Objective  Vitals:   06/12/17 1034  BP: 131/73  Pulse: 61  Resp: 16  Temp: 98.5 F (36.9 C)  TempSrc: Oral  SpO2: 98%  Weight: 232 lb 4.8 oz (105.4 kg)  Height: 5\' 9"  (1.753 m)    Physical Exam  Constitutional: He is oriented to person, place, and time and well-developed, well-nourished, and in no distress.  HENT:  Head: Normocephalic and atraumatic.  Cardiovascular: Normal rate, regular rhythm and normal heart sounds.   No murmur heard. Pulmonary/Chest: Effort normal and breath sounds normal. He has no wheezes.  Abdominal: Soft. Bowel sounds are normal.  Musculoskeletal: He exhibits tenderness. He exhibits no edema.  Neurological: He is alert and oriented to person, place, and time.  Psychiatric: Mood, memory, affect and judgment normal.  Nursing note and vitals reviewed.     Recent Results (from the past 2160 hour(s))  POCT Glucose (CBG)     Status: Abnormal   Collection Time: 06/12/17 10:40 AM  Result Value Ref Range   POC Glucose 155 (A) 70 - 99 mg/dl  POCT HgB A1C     Status: Normal   Collection Time: 06/12/17 10:44 AM  Result Value  Ref Range   Hemoglobin A1C 5.5      Assessment & Plan  1. Controlled type 2 diabetes mellitus with diabetic neuropathy, without long-term current use of insulin (HCC) Point-of-care A1c is 5.5%, well-controlled diabetes, continue on metformin and Januvia as prescribed, Lyrica for the treatment of peripheral neuropathy. - POCT HgB A1C - POCT Glucose (CBG) - lisinopril (PRINIVIL,ZESTRIL) 2.5 MG tablet; Take 1 tablet (2.5 mg total) by mouth daily.  Dispense: 90 tablet; Refill: 1 - metFORMIN (GLUCOPHAGE) 1000 MG tablet; Take 1 tablet (1,000 mg total) by mouth 2 (two) times daily.  Dispense: 180 tablet; Refill: 1 - pregabalin (LYRICA) 50 MG capsule; Take 1 capsule (50 mg total) by mouth 3 (three) times daily.  Dispense: 270 capsule; Refill: 0 - sitaGLIPtin (JANUVIA) 50 MG tablet; Take 1 tablet (50 mg total) by mouth daily.  Dispense: 90 tablet; Refill: 1  2. Dyslipidemia Obtain FLP, continue on statin - atorvastatin (LIPITOR) 20 MG tablet; Take 1 tablet (20 mg total) by mouth daily at 6 PM.  Dispense: 90 tablet; Refill: 1 - Lipid panel - COMPLETE METABOLIC PANEL WITH GFR  3. Essential hypertension BP stable on present antihypertensive treatment - atenolol (TENORMIN) 100 MG tablet; Take 1 tablet (100 mg total) by mouth daily.  Dispense: 90 tablet; Refill: 1  4. Gastroesophageal reflux disease, esophagitis presence not specified  - esomeprazole (NEXIUM) 40 MG capsule; Take 1 capsule (40 mg total) by mouth daily.  Dispense: 90 capsule; Refill: 1  5. Generalized anxiety disorder  Symptoms responsive to clonazepam taken twice daily as needed  - clonazePAM (KLONOPIN) 1 MG tablet; Take 1 tablet (1 mg total) by mouth 2 (two) times daily as needed.  Dispense: 180 tablet; Refill: 0  6. Insomnia secondary to chronic pain  - clonazePAM (KLONOPIN) 0.5 MG tablet; Take 1 tablet (0.5 mg total) by mouth at bedtime as needed for anxiety.  Dispense: 90 tablet; Refill: 0   Selma Rodelo Asad A.  [facility] 06/12/2017 10:45 AM

## 2017-06-13 NOTE — Progress Notes (Signed)
Patient's Name: Joel Bryant  MRN: [id number]  Referring Provider: Roselee Nova, MD  DOB: 01/10/1961  PCP: Roselee Nova, MD  DOS: 06/14/2017  Note by: Vevelyn Francois NP  Service setting: Ambulatory outpatient  Specialty: Interventional Pain Management  Location: [facility] (AMB) Pain Management Facility    Patient type: Established    Primary Reason(s) for Visit: Encounter for prescription drug management. (Level of risk: moderate)  CC: Back Pain (lower)  HPI  Joel Bryant is a 56 y.o. year old, male patient, who comes today for a medication management evaluation. 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 His primarily concern today is the Back Pain (lower)  Pain Assessment: Location: Lower Back Radiating: hips down side of legs to knees bilaterally Onset: More than a month ago Duration: Chronic pain Quality: Stabbing Severity: 2 /10 (self-reported pain score)  Note: Reported level is compatible with observation.                   Effect on ADL: pace self Timing: Constant Modifying factors: medications  Joel Bryant was last scheduled for an appointment on 03/15/2017 for medication management. During today's appointment we reviewed [name]'s chronic pain status, as well as his outpatient medication regimen. He states the pain waxes and wanes. He states that his back occasionally does "lock up". He would like  to get back on Methadone. He states if he could find a clinic he would go. He state that when he was on this he did better. He continues to be concern about the scar tissue in his back getting worse with time and is concern if he will be in a wheelchair with or without the surgery.   The patient  reports that he does not use drugs. His body mass index is 33.97 kg/m.  Further details on both, my assessment(s), as well as the proposed treatment plan, please see below.  Controlled Substance Pharmacotherapy Assessment REMS (Risk Evaluation and Mitigation Strategy)  Analgesic:MS Contin (morphine ER) 15 mg every 12 hours (30 mg/day) MME/day:30 mg/day  [name], RN  06/14/2017  8:14 AM  Sign at close encounter Nursing Pain Medication Assessment:  Safety precautions to be maintained throughout the outpatient stay will include: orient to surroundings, keep bed in low position, maintain call bell within reach at all times, provide assistance with transfer out of bed and ambulation.  Medication Inspection Compliance: Pill count conducted under aseptic conditions, in front of the patient. Neither the pills nor the bottle was removed from the patient's sight at any time. Once count was completed pills were immediately returned to the patient in their original bottle.  Medication: See above Pill/Patch Count: 0 of 60 pills remain Pill/Patch Appearance: Markings consistent with prescribed medication Bottle Appearance: Standard pharmacy container. Clearly labeled. Filled Date: 7 /12 / 2018 Last Medication intake:  Day before yesterday   Pharmacokinetics: Liberation and absorption (onset of action): WNL Distribution (time to peak effect): WNL Metabolism and excretion (duration of action): WNL  Pharmacodynamics: Desired effects: Analgesia: Joel Bryant reports >50% benefit. Functional ability: Patient reports that medication allows him to accomplish basic ADLs Clinically meaningful  improvement in function (CMIF): Sustained CMIF goals met Perceived effectiveness: Described as relatively effective, allowing for increase in activities of daily living (ADL) Undesirable effects: Side-effects or Adverse reactions: None reported Monitoring: [place] PMP: Online review of the past 81-monthperiod conducted. Compliant with practice rules and regulations List of all UDS test(s) done:  Lab Results  Component Value Date   TOXASSSELUR FINAL 12/07/2016   [place]AL 06/07/2016   [place]FINAL 03/22/2016   TOXASSSELUR FINAL 12/27/2015   TOXASSSELUR FINAL 09/27/2015   Last UDS on record: ToxAssure Select 13  Date Value Ref Range Status  12/07/2016 FINAL  Final    Comment:    ==================================================================== TOXASSURE SELECT 13 (MW) ==================================================================== Test                             Result       Flag       Units Drug Present and Declared for Prescription Verification   7-aminoclonazepam              265          EXPECTED   ng/mg creat    7-aminoclonazepam is an expected metabolite of clonazepam. Source    of clonazepam is a scheduled prescription medication.   Morphine                       >14706       EXPECTED   ng/mg creat    Potential sources of large amounts of morphine in the absence of    codeine include administration of morphine or use of heroin. ==================================================================== Test                      Result    Flag   Units      Ref Range   Creatinine              68               mg/dL      >=20 ==================================================================== Declared Medications:  The flagging and interpretation on this report are based on the  following declared medications.  Unexpected results may arise from  inaccuracies in the declared medications.  **Note: The testing scope of this panel includes these medications:   Clonazepam  Morphine (MS Contin)  **Note: The testing scope of this panel does not include following  reported medications:  Acetaminophen (Tylenol PM)  Aspirin  Atenolol  Atorvastatin (Lipitor)  Cyanocobalamin  Diphenhydramine (Tylenol PM)  Furosemide (Lasix)  Lisinopril  Magnesium Oxide  Metformin  Multivitamin  Omega-3 Fatty Acids (Lovaza)  Omeprazole (Nexium)  Pregabalin (Lyrica)  Sitagliptin (Januvia)  Tizanidine (Zanaflex)  Vitamin B (Biotin)  Vitamin D3 ==================================================================== For clinical consultation, please call ([phone] ====================================================================    UDS interpretation: Compliant          Medication Assessment Form: Reviewed. Patient indicates being compliant with therapy Treatment compliance: Compliant Risk Assessment Profile: Aberrant behavior: request for specific drugs or requesting "Brand Name" drugs  Comorbid factors increasing risk of overdose: Benzodiazepine use, caucasian, family history of addiction, history of substance use disorder and male gender         Admits that his daughter has an addiction to heroin Risk of substance use disorder (SUD): High  Opioid Risk Tool - 06/14/17 0811      Family History of Substance Abuse   Alcohol Positive Male   Illegal Drugs Positive Male   Rx Drugs Negative     Personal History of Substance Abuse   Alcohol Positive Male or Male   Illegal Drugs Positive Male or Male   Rx Drugs Positive Male or Male     Psychological Disease   Psychological Disease Negative   Depression Negative     Total Score   Opioid Risk Tool Scoring 18   Opioid Risk Interpretation High Risk     ORT Scoring interpretation table:  Score <3 = Low Risk for SUD  Score between 4-7 = Moderate Risk for SUD  Score >8 = High Risk for Opioid Abuse   Risk Mitigation Strategies:  Patient Counseling: Covered Patient-Prescriber Agreement  (PPA): Present and active  Notification to other healthcare providers: Done  Pharmacologic Plan: No change in therapy, at this time  Laboratory Chemistry  Inflammation Markers (CRP: Acute Phase) (ESR: Chronic Phase) Lab Results  Component Value Date   CRP <0.5 09/27/2015   ESRSEDRATE 4 09/27/2015                 Renal Function Markers Lab Results  Component Value Date   BUN 7 09/07/2016   CREATININE 0.84 09/07/2016   GFRAA >89 09/07/2016   GFRNONAA >89 09/07/2016                 Hepatic Function Markers Lab Results  Component Value Date   AST 47 (H) 09/07/2016   ALT 30 09/07/2016   ALBUMIN 4.8 09/07/2016   ALKPHOS 49 09/07/2016                 Electrolytes Lab Results  Component Value Date   NA 141 09/07/2016   K 4.6 09/07/2016   CL 101 09/07/2016   CALCIUM 9.6 09/07/2016   MG 1.3 (L) 09/27/2015                 Neuropathy Markers No results found for: [id number]               Bone Pathology Markers Lab Results  Component Value Date   ALKPHOS 49 09/07/2016   25OHVITD1 17 (L) 09/27/2015   25OHVITD2 <1.0 09/27/2015   25OHVITD3 17 09/27/2015   CALCIUM 9.6 09/07/2016                 Coagulation Parameters Lab Results  Component Value Date   PLT 211 11/18/2011                 Cardiovascular Markers Lab Results  Component Value Date   HGB 15.9 11/18/2011   HCT 45.8 11/18/2011                 Note: Lab results reviewed.  Recent Diagnostic Imaging Review  No results found. Note: Imaging results reviewed.          Meds   Current Meds  Medication Sig  . aspirin 81 MG tablet Take 81 mg by mouth daily.  Marland Kitchen atenolol (TENORMIN) 100 MG tablet Take 1 tablet (100 mg total) by mouth daily.  Marland Kitchen atorvastatin (LIPITOR) 20 MG tablet Take 1 tablet (20 mg total) by mouth daily at 6 PM.  . Biotin 5000 MCG CAPS Take 5,000 capsules by mouth 2 (two) times daily.  . Cholecalciferol (VITAMIN D3) 2000 units TABS Take 2,000 tablets by mouth daily.  . clonazePAM  (KLONOPIN)  0.5 MG tablet Take 1 tablet (0.5 mg total) by mouth at bedtime as needed for anxiety.  . clonazePAM (KLONOPIN) 1 MG tablet Take 1 tablet (1 mg total) by mouth 2 (two) times daily as needed.  . diphenhydramine-acetaminophen (TYLENOL PM) 25-500 MG TABS tablet Take 1 tablet by mouth at bedtime as needed.  Marland Kitchen esomeprazole (NEXIUM) 40 MG capsule Take 1 capsule (40 mg total) by mouth daily.  Marland Kitchen ibuprofen (ADVIL,MOTRIN) 800 MG tablet Take 800 mg by mouth every 8 (eight) hours as needed.  Marland Kitchen lisinopril (PRINIVIL,ZESTRIL) 2.5 MG tablet Take 1 tablet (2.5 mg total) by mouth daily.  . metFORMIN (GLUCOPHAGE) 1000 MG tablet Take 1 tablet (1,000 mg total) by mouth 2 (two) times daily.  . Multiple Vitamins-Minerals (SENTRY SENIOR PO) Take 1 tablet by mouth.  . pregabalin (LYRICA) 50 MG capsule Take 1 capsule (50 mg total) by mouth 3 (three) times daily.  . sitaGLIPtin (JANUVIA) 50 MG tablet Take 1 tablet (50 mg total) by mouth daily.  . vitamin B-12 (CYANOCOBALAMIN) 500 MCG tablet Take 500 mcg by mouth daily.  . [DISCONTINUED] omega-3 acid ethyl esters (LOVAZA) 1 g capsule Take 2 capsules (2 g total) by mouth 2 (two) times daily.  . [DISCONTINUED] tiZANidine (ZANAFLEX) 4 MG tablet Take 1 tablet (4 mg total) by mouth daily.    ROS  Constitutional: Denies any fever or chills Gastrointestinal: No reported hemesis, hematochezia, vomiting, or acute GI distress Musculoskeletal: Denies any acute onset joint swelling, redness, loss of ROM, or weakness Neurological: No reported episodes of acute onset apraxia, aphasia, dysarthria, agnosia, amnesia, paralysis, loss of coordination, or loss of consciousness  Allergies  Mr. Schurman has No Known Allergies.  PFSH  Drug: Mr. Hagy  reports that he does not use drugs. Alcohol:  reports that he drinks alcohol. Tobacco:  reports that he has quit smoking. His smoking use included Cigarettes. He has never used smokeless tobacco. Medical:  has a past medical history  of Anxiety; Chronic LBP (09/13/2015); Chronic left hip pain (09/27/2015); Diabetes mellitus without complication ([place]); GERD (gastroesophageal reflux disease); Hyperlipidemia; Hyperlipidemia; Hypertension; and Hypomagnesemia. Surgical: Mr. Ervine  has a past surgical history that includes Laminectomy (2001); Cataract extraction (1992); Wrist surgery (Right); Eye surgery; Tonsillectomy; Tibia fracture surgery; Colonoscopy with propofol (N/A, 02/28/2017); and Multiple tooth extractions. Family: family history includes Alcohol abuse in his father; Cancer in his maternal grandfather; Colon polyps in his mother; Diabetes in his paternal grandmother; Retinitis pigmentosa in his maternal grandmother and mother.  Constitutional Exam  General appearance: Well nourished, well developed, and well hydrated. In no apparent acute distress Vitals:   06/14/17 0804  BP: (!) 152/85  Pulse: 67  Resp: 16  Temp: (!) 97.4 F (36.3 C)  SpO2: 99%  Weight: 230 lb (104.3 kg)  Height: '5\' 9"'  (1.753 m)   BMI Assessment: Estimated body mass index is 33.97 kg/m as calculated from the following:   Height as of this encounter: '5\' 9"'  (1.753 m).   Weight as of this encounter: 230 lb (104.3 kg).  Psych/Mental status: Alert, oriented x 3 (person, place, & time)       Eyes: PERLA Respiratory: No evidence of acute respiratory distress  Lumbar Spine Area Exam  Skin & Axial Inspection: Well healed scar from previous spine surgery detected Alignment: Symmetrical Functional ROM: Unrestricted ROM      Stability: No instability detected Muscle Tone/Strength: Functionally intact. No obvious neuro-muscular anomalies detected. Sensory (Neurological): Unimpaired Palpation: No palpable anomalies  Provocative Tests: Lumbar Hyperextension and rotation test: evaluation deferred today       Lumbar Lateral bending test: evaluation deferred today       Patrick's Maneuver: evaluation deferred today                    Gait &  Posture Assessment  Ambulation: Unassisted Gait: Relatively normal for age and body habitus Posture: WNL   Lower Extremity Exam    Side: Right lower extremity  Side: Left lower extremity  Skin & Extremity Inspection: Skin color, temperature, and hair growth are WNL. No peripheral edema or cyanosis. No masses, redness, swelling, asymmetry, or associated skin lesions. No contractures.  Skin & Extremity Inspection: Skin color, temperature, and hair growth are WNL. No peripheral edema or cyanosis. No masses, redness, swelling, asymmetry, or associated skin lesions. No contractures.  Functional ROM: Unrestricted ROM          Functional ROM: Unrestricted ROM          Muscle Tone/Strength: Functionally intact. No obvious neuro-muscular anomalies detected.  Muscle Tone/Strength: Functionally intact. No obvious neuro-muscular anomalies detected.  Sensory (Neurological): Unimpaired  Sensory (Neurological): Unimpaired  Palpation: No palpable anomalies  Palpation: No palpable anomalies   Assessment  Primary Diagnosis & Pertinent Problem List: The primary encounter diagnosis was Lumbar spondylosis. Diagnoses of Lumbar facet syndrome (Bilateral), Chronic hip pain, unspecified laterality, Chronic pain syndrome, and Long term current use of opiate analgesic were also pertinent to this visit.  Status Diagnosis  Persistent Persistent Persistent 1. Lumbar spondylosis   2. Lumbar facet syndrome (Bilateral)   3. Chronic hip pain, unspecified laterality   4. Chronic pain syndrome   5. Long term current use of opiate analgesic     Problems updated and reviewed during this visit: No problems updated. Plan of Care  Pharmacotherapy (Medications Ordered): Meds ordered this encounter  Medications  . morphine (MS CONTIN) 15 MG 12 hr tablet    Sig: Take 1 tablet (15 mg total) by mouth every 12 (twelve) hours.    Dispense:  60 tablet    Refill:  0    Do not place this medication, or any other prescription from  our practice, on "Automatic Refill". Patient may have prescription filled one day early if pharmacy is closed on scheduled refill date. Do not fill until: 06/19/2017 To last until:07/19/2017  . morphine (MS CONTIN) 15 MG 12 hr tablet    Sig: Take 1 tablet (15 mg total) by mouth every 12 (twelve) hours.    Dispense:  60 tablet    Refill:  0    Do not place this medication, or any other prescription from our practice, on "Automatic Refill". Patient may have prescription filled one day early if pharmacy is closed on scheduled refill date. Do not fill until:07/19/2017 To last until:08/18/2017  . morphine (MS CONTIN) 15 MG 12 hr tablet    Sig: Take 1 tablet (15 mg total) by mouth every 12 (twelve) hours.    Dispense:  60 tablet    Refill:  0    Do not place this medication, or any other prescription from our practice, on "Automatic Refill". Patient may have prescription filled one day early if pharmacy is closed on scheduled refill date. Do not fill until: 08/18/2017 To last until: 09/17/2017  . tizanidine (ZANAFLEX) 6 MG capsule    Sig: Take 1 capsule (6 mg total) by mouth 3 (three) times daily as needed for muscle spasms.    Dispense:  90 capsule    Refill:  2    Do not place this medication, or any other prescription from our practice, on "Automatic Refill". Patient may have prescription filled one day early if pharmacy is closed on scheduled refill date.    Order Specific Question:   Supervising Provider    Answer:   Milinda Pointer [phone]   New Prescriptions   TIZANIDINE (ZANAFLEX) 6 MG CAPSULE    Take 1 capsule (6 mg total) by mouth 3 (three) times daily as needed for muscle spasms.   Medications administered today: Mr. Lunz had no medications administered during this visit. Lab-work, procedure(s), and/or referral(s): No orders of the defined types were placed in this encounter.  Imaging and/or referral(s): None  Interventional therapies: Planned, scheduled, and/or pending:    Not at this time.    Considering:   Diagnostic bilateral lumbar facet block Possible spinal cord stimulator trial.   Palliative PRN treatment(s):   Diagnostic bilateral lumbar facet block   Provider-requested follow-up: Return in about 3 months (around 09/14/2017) for MedMgmt.  Future Appointments Date Time Provider [place]  09/12/2017 10:00 AM Roselee Nova, MD [place] None  09/13/2017 8:30 AM Vevelyn Francois, NP [facility] None   Primary Care Physician: Roselee Nova, MD Location: [facility] Out[facility] Management Facility Note by: Vevelyn Francois NP Date: 06/14/2017; Time: 9:21 AM  Pain Score Disclaimer: We use the NRS-11 scale. This is a self-reported, subjective measurement of pain severity with only modest accuracy. It is used primarily to identify changes within a particular patient. It must be understood that outpatient pain scales are significantly less accurate that those used for research, where they can be applied under ideal controlled circumstances with minimal exposure to variables. In reality, the score is likely to be a combination of pain intensity and pain affect, where pain affect describes the degree of emotional arousal or changes in action readiness caused by the sensory experience of pain. Factors such as social and work situation, setting, emotional state, anxiety levels, expectation, and prior pain experience may influence pain perception and show large inter-individual differences that may also be affected by time variables.  Patient instructions provided during this appointment: Patient Instructions    ____________________________________________________________________________________________  Medication Rules  Applies to: All patients receiving prescriptions (written or electronic).  Pharmacy of record: Pharmacy where electronic prescriptions will be sent. If written prescriptions are taken to a different pharmacy, please inform the  nursing staff. The pharmacy listed in the electronic medical record should be the one where you would like electronic prescriptions to be sent.  Prescription refills: Only during scheduled appointments. Applies to both, written and electronic prescriptions.  NOTE: The following applies primarily to controlled substances (Opioid* Pain Medications).   Patient's responsibilities: 1. Pain Pills: Bring all pain pills to every appointment (except for procedure appointments). 2. Pill Bottles: Bring pills in original pharmacy bottle. Always bring newest bottle. Bring bottle, even if empty. 3. Medication refills: You are responsible for knowing and keeping track of what medications you need refilled. The day before your appointment, write a list of all prescriptions that need to be refilled. Bring that list to your appointment and give it to the admitting nurse. Prescriptions will be written only during appointments. If you forget a medication, it will not be "Called in", "Faxed", or "electronically sent". You will need to get another appointment to get these prescribed. 4. Prescription Accuracy: You are responsible for carefully inspecting your prescriptions before leaving our  office. Have the discharge nurse carefully go over each prescription with you, before taking them home. Make sure that your name is accurately spelled, that your address is correct. Check the name and dose of your medication to make sure it is accurate. Check the number of pills, and the written instructions to make sure they are clear and accurate. Make sure that you are given enough medication to last until your next medication refill appointment. 5. Taking Medication: Take medication as prescribed. Never take more pills than instructed. Never take medication more frequently than prescribed. Taking less pills or less frequently is permitted and encouraged, when it comes to controlled substances (written prescriptions).  6. Inform other  Doctors: Always inform, all of your healthcare providers, of all the medications you take. 7. Pain Medication from other Providers: You are not allowed to accept any additional pain medication from any other Doctor or Healthcare provider. There are two exceptions to this rule. (see below) In the event that you require additional pain medication, you are responsible for notifying us, as stated below. 8. Medication Agreement: You are responsible for carefully reading and following our Medication Agreement. This must be signed before receiving any prescriptions from our practice. Safely store a copy of your signed Agreement. Violations to the Agreement will result in no further prescriptions. (Additional copies of our Medication Agreement are available upon request.) 9. Laws, Rules, & Regulations: All patients are expected to follow all Federal and [employer], Rules, [employer]. Ignorance of the Laws does not constitute a valid excuse. The use of any illegal substances is prohibited. 10. Adopted CDC guidelines & recommendations: Target dosing levels will be at or below 60 MME/day. Use of benzodiazepines** is not recommended.  Exceptions: There are only two exceptions to the rule of not receiving pain medications from other Healthcare Providers. 1. Exception #1 (Emergencies): In the event of an emergency (i.e.: accident requiring emergency care), you are allowed to receive additional pain medication. However, you are responsible for: As soon as you are able, call our office [phone], at any time of the day or night, and leave a message stating your name, the date and nature of the emergency, and the name and dose of the medication prescribed. In the event that your call is answered by a member of our staff, make sure to document and save the date, time, and the name of the person that took your information.  2. Exception #2 (Planned Surgery): In the event that you are scheduled by another  doctor or dentist to have any type of surgery or procedure, you are allowed (for a period no longer than 30 days), to receive additional pain medication, for the acute post-op pain. However, in this case, you are responsible for picking up a copy of our "Post-op Pain Management for Surgeons" handout, and giving it to your surgeon or dentist. This document is available at our office, and does not require an appointment to obtain it. Simply go to our office during business hours (Monday-Thursday from 8:00 AM to 4:00 PM) (Friday 8:00 AM to 12:00 Noon) or if you have a scheduled appointment with [place], prior to your surgery, and ask for it by name. In addition, you will need to provide [place] with your name, name of your surgeon, type of surgery, and date of procedure or surgery.  *Opioid medications include: morphine, codeine, oxycodone, oxymorphone, hydrocodone, hydromorphone, meperidine, tramadol, tapentadol, buprenorphine, fentanyl, methadone. **Benzodiazepine medications include: diazepam (Valium), alprazolam (Xanax), clonazepam (Klonopine), lorazepam (  Ativan), clorazepate (Tranxene), chlordiazepoxide (Librium), estazolam (Prosom), oxazepam (Serax), temazepam (Restoril), triazolam (Halcion)  ____________________________________________________________________________________________ BMI Assessment: Estimated body mass index is 33.97 kg/m as calculated from the following:   Height as of this encounter: '5\' 9"'  (1.753 m).   Weight as of this encounter: 230 lb (104.3 kg).  BMI interpretation table: BMI level Category Range association with higher incidence of chronic pain  <18 kg/m2 Underweight   18.5-24.9 kg/m2 Ideal body weight   25-29.9 kg/m2 Overweight Increased incidence by 20%  30-34.9 kg/m2 Obese (Class I) Increased incidence by 68%  35-39.9 kg/m2 Severe obesity (Class II) Increased incidence by 136%  >40 kg/m2 Extreme obesity (Class III) Increased incidence by 254%   BMI Readings from Last 4  Encounters:  06/14/17 33.97 kg/m  06/12/17 34.30 kg/m  03/15/17 33.52 kg/m  03/06/17 33.61 kg/m   Wt Readings from Last 4 Encounters:  06/14/17 230 lb (104.3 kg)  06/12/17 232 lb 4.8 oz (105.4 kg)  03/15/17 227 lb (103 kg)  03/06/17 227 lb 9.6 oz (103.2 kg)

## 2017-06-14 ENCOUNTER — Telehealth: Payer: Self-pay | Admitting: *Deleted

## 2017-06-14 ENCOUNTER — Ambulatory Visit: Payer: Medicare Other | Attending: Nurse Practitioner | Admitting: Nurse Practitioner

## 2017-06-14 ENCOUNTER — Telehealth: Payer: Self-pay

## 2017-06-14 ENCOUNTER — Other Ambulatory Visit: Payer: Self-pay | Admitting: Family Medicine

## 2017-06-14 ENCOUNTER — Encounter: Payer: Self-pay | Admitting: Nurse Practitioner

## 2017-06-14 VITALS — BP 152/85 | HR 67 | Temp 97.4°F | Resp 16 | Ht 69.0 in | Wt 230.0 lb

## 2017-06-14 DIAGNOSIS — F419 Anxiety disorder, unspecified: Secondary | ICD-10-CM | POA: Diagnosis not present

## 2017-06-14 DIAGNOSIS — E119 Type 2 diabetes mellitus without complications: Secondary | ICD-10-CM | POA: Insufficient documentation

## 2017-06-14 DIAGNOSIS — M1288 Other specific arthropathies, not elsewhere classified, other specified site: Secondary | ICD-10-CM | POA: Diagnosis not present

## 2017-06-14 DIAGNOSIS — E785 Hyperlipidemia, unspecified: Secondary | ICD-10-CM | POA: Diagnosis not present

## 2017-06-14 DIAGNOSIS — Z809 Family history of malignant neoplasm, unspecified: Secondary | ICD-10-CM | POA: Insufficient documentation

## 2017-06-14 DIAGNOSIS — Z760 Encounter for issue of repeat prescription: Secondary | ICD-10-CM | POA: Insufficient documentation

## 2017-06-14 DIAGNOSIS — M25559 Pain in unspecified hip: Secondary | ICD-10-CM

## 2017-06-14 DIAGNOSIS — Z811 Family history of alcohol abuse and dependence: Secondary | ICD-10-CM | POA: Diagnosis not present

## 2017-06-14 DIAGNOSIS — G894 Chronic pain syndrome: Secondary | ICD-10-CM | POA: Insufficient documentation

## 2017-06-14 DIAGNOSIS — G8929 Other chronic pain: Secondary | ICD-10-CM

## 2017-06-14 DIAGNOSIS — M48061 Spinal stenosis, lumbar region without neurogenic claudication: Secondary | ICD-10-CM | POA: Diagnosis not present

## 2017-06-14 DIAGNOSIS — M25552 Pain in left hip: Secondary | ICD-10-CM | POA: Diagnosis not present

## 2017-06-14 DIAGNOSIS — Z79891 Long term (current) use of opiate analgesic: Secondary | ICD-10-CM | POA: Insufficient documentation

## 2017-06-14 DIAGNOSIS — M4696 Unspecified inflammatory spondylopathy, lumbar region: Secondary | ICD-10-CM | POA: Diagnosis not present

## 2017-06-14 DIAGNOSIS — Z833 Family history of diabetes mellitus: Secondary | ICD-10-CM | POA: Diagnosis not present

## 2017-06-14 DIAGNOSIS — K219 Gastro-esophageal reflux disease without esophagitis: Secondary | ICD-10-CM | POA: Insufficient documentation

## 2017-06-14 DIAGNOSIS — M545 Low back pain: Secondary | ICD-10-CM | POA: Diagnosis not present

## 2017-06-14 DIAGNOSIS — M961 Postlaminectomy syndrome, not elsewhere classified: Secondary | ICD-10-CM | POA: Insufficient documentation

## 2017-06-14 DIAGNOSIS — Z7982 Long term (current) use of aspirin: Secondary | ICD-10-CM | POA: Insufficient documentation

## 2017-06-14 DIAGNOSIS — E781 Pure hyperglyceridemia: Secondary | ICD-10-CM

## 2017-06-14 DIAGNOSIS — M47896 Other spondylosis, lumbar region: Secondary | ICD-10-CM | POA: Insufficient documentation

## 2017-06-14 DIAGNOSIS — M47816 Spondylosis without myelopathy or radiculopathy, lumbar region: Secondary | ICD-10-CM | POA: Diagnosis not present

## 2017-06-14 DIAGNOSIS — Z7984 Long term (current) use of oral hypoglycemic drugs: Secondary | ICD-10-CM | POA: Insufficient documentation

## 2017-06-14 DIAGNOSIS — I10 Essential (primary) hypertension: Secondary | ICD-10-CM | POA: Diagnosis not present

## 2017-06-14 DIAGNOSIS — M25562 Pain in left knee: Secondary | ICD-10-CM | POA: Diagnosis not present

## 2017-06-14 MED ORDER — MORPHINE SULFATE ER 15 MG PO TBCR: 15.0000 mg | EXTENDED_RELEASE_TABLET | Freq: Two times a day (BID) | ORAL | 0 refills | Status: DC

## 2017-06-14 MED ORDER — TIZANIDINE HCL 4 MG PO TABS: 4.0000 mg | ORAL_TABLET | Freq: Four times a day (QID) | ORAL | 2 refills | Status: AC | PRN

## 2017-06-14 MED ORDER — OMEGA-3-ACID ETHYL ESTERS 1 G PO CAPS: 2.0000 g | ORAL_CAPSULE | Freq: Two times a day (BID) | ORAL | 1 refills | Status: DC

## 2017-06-14 MED ORDER — TIZANIDINE HCL 6 MG PO CAPS
6.0000 mg | ORAL_CAPSULE | Freq: Three times a day (TID) | ORAL | 2 refills | Status: DC | PRN
Start: 2017-06-19 — End: 2017-06-14

## 2017-06-14 NOTE — Progress Notes (Signed)
Nursing Pain Medication Assessment:  Safety precautions to be maintained throughout the outpatient stay will include: orient to surroundings, keep bed in low position, maintain call bell within reach at all times, provide assistance with transfer out of bed and ambulation.  Medication Inspection Compliance: Pill count conducted under aseptic conditions, in front of the patient. Neither the pills nor the bottle was removed from the patient's sight at any time. Once count was completed pills were immediately returned to the patient in their original bottle.  Medication: See above Pill/Patch Count: 0 of 60 pills remain Pill/Patch Appearance: Markings consistent with prescribed medication Bottle Appearance: Standard pharmacy container. Clearly labeled. Filled Date: 7 /12 / 2018 Last Medication intake:  Day before yesterday

## 2017-06-14 NOTE — Patient Instructions (Addendum)
____________________________________________________________________________________________  Medication Rules  Applies to: All patients receiving prescriptions (written or electronic).  Pharmacy of record: Pharmacy where electronic prescriptions will be sent. If written prescriptions are taken to a different pharmacy, please inform the nursing staff. The pharmacy listed in the electronic medical record should be the one where you would like electronic prescriptions to be sent.  Prescription refills: Only during scheduled appointments. Applies to both, written and electronic prescriptions.  NOTE: The following applies primarily to controlled substances (Opioid* Pain Medications).   Patient's responsibilities: 1. Pain Pills: Bring all pain pills to every appointment (except for procedure appointments). 2. Pill Bottles: Bring pills in original pharmacy bottle. Always bring newest bottle. Bring bottle, even if empty. 3. Medication refills: You are responsible for knowing and keeping track of what medications you need refilled. The day before your appointment, write a list of all prescriptions that need to be refilled. Bring that list to your appointment and give it to the admitting nurse. Prescriptions will be written only during appointments. If you forget a medication, it will not be "Called in", "Faxed", or "electronically sent". You will need to get another appointment to get these prescribed. 4. Prescription Accuracy: You are responsible for carefully inspecting your prescriptions before leaving our office. Have the discharge nurse carefully go over each prescription with you, before taking them home. Make sure that your name is accurately spelled, that your address is correct. Check the name and dose of your medication to make sure it is accurate. Check the number of pills, and the written instructions to make sure they are clear and accurate. Make sure that you are given enough medication to  last until your next medication refill appointment. 5. Taking Medication: Take medication as prescribed. Never take more pills than instructed. Never take medication more frequently than prescribed. Taking less pills or less frequently is permitted and encouraged, when it comes to controlled substances (written prescriptions).  6. Inform other Doctors: Always inform, all of your healthcare providers, of all the medications you take. 7. Pain Medication from other Providers: You are not allowed to accept any additional pain medication from any other Doctor or Healthcare provider. There are two exceptions to this rule. (see below) In the event that you require additional pain medication, you are responsible for notifying us, as stated below. 8. Medication Agreement: You are responsible for carefully reading and following our Medication Agreement. This must be signed before receiving any prescriptions from our practice. Safely store a copy of your signed Agreement. Violations to the Agreement will result in no further prescriptions. (Additional copies of our Medication Agreement are available upon request.) 9. Laws, Rules, & Regulations: All patients are expected to follow all Federal and State Laws, Statutes, Rules, & Regulations. Ignorance of the Laws does not constitute a valid excuse. The use of any illegal substances is prohibited. 10. Adopted CDC guidelines & recommendations: Target dosing levels will be at or below 60 MME/day. Use of benzodiazepines** is not recommended.  Exceptions: There are only two exceptions to the rule of not receiving pain medications from other Healthcare Providers. 1. Exception #1 (Emergencies): In the event of an emergency (i.e.: accident requiring emergency care), you are allowed to receive additional pain medication. However, you are responsible for: As soon as you are able, call our office [phone], at any time of the day or night, and leave a message stating your  name, the date and nature of the emergency, and the name and dose of the medication   prescribed. In the event that your call is answered by a member of our staff, make sure to document and save the date, time, and the name of the person that took your information.  2. Exception #2 (Planned Surgery): In the event that you are scheduled by another doctor or dentist to have any type of surgery or procedure, you are allowed (for a period no longer than 30 days), to receive additional pain medication, for the acute post-op pain. However, in this case, you are responsible for picking up a copy of our "Post-op Pain Management for Surgeons" handout, and giving it to your surgeon or dentist. This document is available at our office, and does not require an appointment to obtain it. Simply go to our office during business hours (Monday-Thursday from 8:00 AM to 4:00 PM) (Friday 8:00 AM to 12:00 Noon) or if you have a scheduled appointment with [place], prior to your surgery, and ask for it by name. In addition, you will need to provide [place] with your name, name of your surgeon, type of surgery, and date of procedure or surgery.  *Opioid medications include: morphine, codeine, oxycodone, oxymorphone, hydrocodone, hydromorphone, meperidine, tramadol, tapentadol, buprenorphine, fentanyl, methadone. **Benzodiazepine medications include: diazepam (Valium), alprazolam (Xanax), clonazepam (Klonopine), lorazepam (Ativan), clorazepate (Tranxene), chlordiazepoxide (Librium), estazolam (Prosom), oxazepam (Serax), temazepam (Restoril), triazolam (Halcion)  ____________________________________________________________________________________________ BMI Assessment: Estimated body mass index is 33.97 kg/m as calculated from the following:   Height as of this encounter: 5\' 9"  (1.753 m).   Weight as of this encounter: 230 lb (104.3 kg).  BMI interpretation table: BMI level Category Range association with higher incidence of chronic pain   <18 kg/m2 Underweight   18.5-24.9 kg/m2 Ideal body weight   25-29.9 kg/m2 Overweight Increased incidence by 20%  30-34.9 kg/m2 Obese (Class I) Increased incidence by 68%  35-39.9 kg/m2 Severe obesity (Class II) Increased incidence by 136%  >40 kg/m2 Extreme obesity (Class III) Increased incidence by 254%   BMI Readings from Last 4 Encounters:  06/14/17 33.97 kg/m  06/12/17 34.30 kg/m  03/15/17 33.52 kg/m  03/06/17 33.61 kg/m   Wt Readings from Last 4 Encounters:  06/14/17 230 lb (104.3 kg)  06/12/17 232 lb 4.8 oz (105.4 kg)  03/15/17 227 lb (103 kg)  03/06/17 227 lb 9.6 oz (103.2 kg)

## 2017-06-14 NOTE — Telephone Encounter (Signed)
Medication has been refilled and sent to [facility]

## 2017-06-15 LAB — COMPREHENSIVE METABOLIC PANEL
ALT: 23 IU/L (ref 0–44)
AST: 36 IU/L (ref 0–40)
Albumin/Globulin Ratio: 2.1 (ref 1.2–2.2)
Albumin: 4.8 g/dL (ref 3.5–5.5)
Alkaline Phosphatase: 61 IU/L (ref 39–117)
BUN/Creatinine Ratio: 12 (ref 9–20)
BUN: 9 mg/dL (ref 6–24)
Bilirubin Total: 0.9 mg/dL (ref 0.0–1.2)
CO2: 26 mmol/L (ref 20–29)
Calcium: 9.8 mg/dL (ref 8.7–10.2)
Chloride: 100 mmol/L (ref 96–106)
Creatinine, Ser: 0.78 mg/dL (ref 0.76–1.27)
GFR calc Af Amer: 116 mL/min/{1.73_m2} (ref >59)
GFR calc non Af Amer: 100 mL/min/{1.73_m2} (ref >59)
Globulin, Total: 2.3 g/dL (ref 1.5–4.5)
Glucose: 107 mg/dL — ABNORMAL HIGH (ref 65–99)
Potassium: 4.6 mmol/L (ref 3.5–5.2)
Sodium: 142 mmol/L (ref 134–144)
Total Protein: 7.1 g/dL (ref 6.0–8.5)

## 2017-06-15 LAB — LIPID PANEL W/O CHOL/HDL RATIO
Cholesterol, Total: 149 mg/dL (ref 100–199)
HDL: 51 mg/dL (ref >39)
LDL Calculated: 60 mg/dL (ref 0–99)
Triglycerides: 188 mg/dL — ABNORMAL HIGH (ref 0–149)
VLDL Cholesterol Cal: 38 mg/dL (ref 5–40)

## 2017-06-22 ENCOUNTER — Telehealth: Payer: Self-pay | Admitting: Family Medicine

## 2017-06-27 ENCOUNTER — Other Ambulatory Visit: Payer: Self-pay | Admitting: Family Medicine

## 2017-09-12 ENCOUNTER — Ambulatory Visit: Payer: Medicare Other | Admitting: Family Medicine

## 2017-09-13 ENCOUNTER — Other Ambulatory Visit: Payer: Self-pay

## 2017-09-13 ENCOUNTER — Encounter: Payer: Self-pay | Admitting: Family Medicine

## 2017-09-13 ENCOUNTER — Ambulatory Visit (INDEPENDENT_AMBULATORY_CARE_PROVIDER_SITE_OTHER): Payer: Medicare Other

## 2017-09-13 ENCOUNTER — Other Ambulatory Visit: Payer: Self-pay | Admitting: Nurse Practitioner

## 2017-09-13 ENCOUNTER — Ambulatory Visit: Payer: Medicare Other | Attending: Nurse Practitioner | Admitting: Nurse Practitioner

## 2017-09-13 ENCOUNTER — Encounter: Payer: Self-pay | Admitting: Nurse Practitioner

## 2017-09-13 VITALS — BP 142/78 | HR 68 | Temp 98.5°F | Resp 14 | Ht 69.0 in | Wt 238.8 lb

## 2017-09-13 VITALS — BP 137/85 | HR 79 | Temp 98.0°F | Resp 16 | Ht 69.0 in | Wt 240.0 lb

## 2017-09-13 DIAGNOSIS — Z79891 Long term (current) use of opiate analgesic: Secondary | ICD-10-CM | POA: Insufficient documentation

## 2017-09-13 DIAGNOSIS — G894 Chronic pain syndrome: Secondary | ICD-10-CM | POA: Diagnosis not present

## 2017-09-13 DIAGNOSIS — Z87891 Personal history of nicotine dependence: Secondary | ICD-10-CM | POA: Insufficient documentation

## 2017-09-13 DIAGNOSIS — M48061 Spinal stenosis, lumbar region without neurogenic claudication: Secondary | ICD-10-CM | POA: Insufficient documentation

## 2017-09-13 DIAGNOSIS — F419 Anxiety disorder, unspecified: Secondary | ICD-10-CM | POA: Insufficient documentation

## 2017-09-13 DIAGNOSIS — G8929 Other chronic pain: Secondary | ICD-10-CM

## 2017-09-13 DIAGNOSIS — M25559 Pain in unspecified hip: Secondary | ICD-10-CM

## 2017-09-13 DIAGNOSIS — E669 Obesity, unspecified: Secondary | ICD-10-CM | POA: Diagnosis not present

## 2017-09-13 DIAGNOSIS — M25562 Pain in left knee: Secondary | ICD-10-CM | POA: Insufficient documentation

## 2017-09-13 DIAGNOSIS — M5442 Lumbago with sciatica, left side: Secondary | ICD-10-CM | POA: Diagnosis not present

## 2017-09-13 DIAGNOSIS — Z6835 Body mass index (BMI) 35.0-35.9, adult: Secondary | ICD-10-CM | POA: Diagnosis not present

## 2017-09-13 DIAGNOSIS — E781 Pure hyperglyceridemia: Secondary | ICD-10-CM | POA: Diagnosis not present

## 2017-09-13 DIAGNOSIS — Z79899 Other long term (current) drug therapy: Secondary | ICD-10-CM | POA: Diagnosis not present

## 2017-09-13 DIAGNOSIS — E1140 Type 2 diabetes mellitus with diabetic neuropathy, unspecified: Secondary | ICD-10-CM | POA: Diagnosis not present

## 2017-09-13 DIAGNOSIS — Z5181 Encounter for therapeutic drug level monitoring: Secondary | ICD-10-CM | POA: Insufficient documentation

## 2017-09-13 DIAGNOSIS — Z7982 Long term (current) use of aspirin: Secondary | ICD-10-CM | POA: Diagnosis not present

## 2017-09-13 DIAGNOSIS — G4733 Obstructive sleep apnea (adult) (pediatric): Secondary | ICD-10-CM | POA: Insufficient documentation

## 2017-09-13 DIAGNOSIS — M47816 Spondylosis without myelopathy or radiculopathy, lumbar region: Secondary | ICD-10-CM | POA: Diagnosis not present

## 2017-09-13 DIAGNOSIS — Z7984 Long term (current) use of oral hypoglycemic drugs: Secondary | ICD-10-CM | POA: Diagnosis not present

## 2017-09-13 DIAGNOSIS — G4700 Insomnia, unspecified: Secondary | ICD-10-CM | POA: Diagnosis not present

## 2017-09-13 DIAGNOSIS — I10 Essential (primary) hypertension: Secondary | ICD-10-CM

## 2017-09-13 DIAGNOSIS — R1310 Dysphagia, unspecified: Secondary | ICD-10-CM | POA: Diagnosis not present

## 2017-09-13 DIAGNOSIS — G4701 Insomnia due to medical condition: Secondary | ICD-10-CM | POA: Diagnosis not present

## 2017-09-13 DIAGNOSIS — M5441 Lumbago with sciatica, right side: Secondary | ICD-10-CM

## 2017-09-13 DIAGNOSIS — F411 Generalized anxiety disorder: Secondary | ICD-10-CM

## 2017-09-13 LAB — POCT GLYCOSYLATED HEMOGLOBIN (HGB A1C): Hemoglobin A1C: 7

## 2017-09-13 LAB — GLUCOSE, POCT (MANUAL RESULT ENTRY): POC Glucose: 111 mg/dl — AB (ref 70–99)

## 2017-09-13 MED ORDER — MORPHINE SULFATE ER 15 MG PO TBCR: 15.0000 mg | EXTENDED_RELEASE_TABLET | Freq: Two times a day (BID) | ORAL | 0 refills | Status: DC

## 2017-09-13 MED ORDER — CLONAZEPAM 1 MG PO TABS: 1.0000 mg | ORAL_TABLET | Freq: Two times a day (BID) | ORAL | 0 refills | Status: DC | PRN

## 2017-09-13 MED ORDER — CLONAZEPAM 0.5 MG PO TABS: 0.5000 mg | ORAL_TABLET | Freq: Every evening | ORAL | 0 refills | Status: DC | PRN

## 2017-09-13 MED ORDER — PREGABALIN 50 MG PO CAPS: 50.0000 mg | ORAL_CAPSULE | Freq: Three times a day (TID) | ORAL | 0 refills | Status: DC

## 2017-09-13 NOTE — Progress Notes (Signed)
Nursing Pain Medication Assessment:  Safety precautions to be maintained throughout the outpatient stay will include: orient to surroundings, keep bed in low position, maintain call bell within reach at all times, provide assistance with transfer out of bed and ambulation.  Medication Inspection Compliance: Pill count conducted under aseptic conditions, in front of the patient. Neither the pills nor the bottle was removed from the patient's sight at any time. Once count was completed pills were immediately returned to the patient in their original bottle.  Medication: Morphine ER (MSContin) Pill/Patch Count: 1 of 60 pills remain Pill/Patch Appearance: Markings consistent with prescribed medication Bottle Appearance: Standard pharmacy container. Clearly labeled. Filled Date: 10/20/ 2018 Last Medication intake:  Today

## 2017-09-13 NOTE — Progress Notes (Signed)
The following letter was provided to Joel Bryant during their visit today: ---------------------------------------  Dear valued [facility] Patient,  I am writing to share that as of December 14, 2017, I will no longer be seeing patients at [facility]. 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  The staff at [facility] has been supportive of my decision and are supportive of any patients who have been under my care.  They will be happy to provide care to you and your family.  The office staff will do everything they can to ensure a seamless transition of care at [facility].  However if you are on any controlled substance medications (i.e. Pain medication or benzodiazepines), they will no longer be able to refill those medications, but we will be more than happy to refer you to a specialist.  [place] [facility] will also assist you with the transfer of medical records should you wish to seek care elsewhere.  If you have any questions about your future care, you may call the office at [phone].  I have enjoyed getting to know my patients here and I wish you the very best.  Sincerely,  Rochel Brome, MD  ---------------------------------------  A written copy of this letter was given to the patient.  The patient verbalizes understanding of the letter, and does request referral to specialist or to new primary care provider.  This decision has been conveyed to Dr. Manuella Ghazi, who will place any appropriate referrals during today's visit.

## 2017-09-13 NOTE — Patient Instructions (Addendum)
____________________________________________________________________________________________  Medication Rules  Applies to: All patients receiving prescriptions (written or electronic).  Pharmacy of record: Pharmacy where electronic prescriptions will be sent. If written prescriptions are taken to a different pharmacy, please inform the nursing staff. The pharmacy listed in the electronic medical record should be the one where you would like electronic prescriptions to be sent.  Prescription refills: Only during scheduled appointments. Applies to both, written and electronic prescriptions.  NOTE: The following applies primarily to controlled substances (Opioid* Pain Medications).   Patient's responsibilities: 1. Pain Pills: Bring all pain pills to every appointment (except for procedure appointments). 2. Pill Bottles: Bring pills in original pharmacy bottle. Always bring newest bottle. Bring bottle, even if empty. 3. Medication refills: You are responsible for knowing and keeping track of what medications you need refilled. The day before your appointment, write a list of all prescriptions that need to be refilled. Bring that list to your appointment and give it to the admitting nurse. Prescriptions will be written only during appointments. If you forget a medication, it will not be "Called in", "Faxed", or "electronically sent". You will need to get another appointment to get these prescribed. 4. Prescription Accuracy: You are responsible for carefully inspecting your prescriptions before leaving our office. Have the discharge nurse carefully go over each prescription with you, before taking them home. Make sure that your name is accurately spelled, that your address is correct. Check the name and dose of your medication to make sure it is accurate. Check the number of pills, and the written instructions to make sure they are clear and accurate. Make sure that you are given enough medication to  last until your next medication refill appointment. 5. Taking Medication: Take medication as prescribed. Never take more pills than instructed. Never take medication more frequently than prescribed. Taking less pills or less frequently is permitted and encouraged, when it comes to controlled substances (written prescriptions).  6. Inform other Doctors: Always inform, all of your healthcare providers, of all the medications you take. 7. Pain Medication from other Providers: You are not allowed to accept any additional pain medication from any other Doctor or Healthcare provider. There are two exceptions to this rule. (see below) In the event that you require additional pain medication, you are responsible for notifying us, as stated below. 8. Medication Agreement: You are responsible for carefully reading and following our Medication Agreement. This must be signed before receiving any prescriptions from our practice. Safely store a copy of your signed Agreement. Violations to the Agreement will result in no further prescriptions. (Additional copies of our Medication Agreement are available upon request.) 9. Laws, Rules, & Regulations: All patients are expected to follow all Federal and State Laws, Statutes, Rules, & Regulations. Ignorance of the Laws does not constitute a valid excuse. The use of any illegal substances is prohibited. 10. Adopted CDC guidelines & recommendations: Target dosing levels will be at or below 60 MME/day. Use of benzodiazepines** is not recommended.  Exceptions: There are only two exceptions to the rule of not receiving pain medications from other Healthcare Providers. 1. Exception #1 (Emergencies): In the event of an emergency (i.e.: accident requiring emergency care), you are allowed to receive additional pain medication. However, you are responsible for: As soon as you are able, call our office [phone], at any time of the day or night, and leave a message stating your  name, the date and nature of the emergency, and the name and dose of the medication   prescribed. In the event that your call is answered by a member of our staff, make sure to document and save the date, time, and the name of the person that took your information.  2. Exception #2 (Planned Surgery): In the event that you are scheduled by another doctor or dentist to have any type of surgery or procedure, you are allowed (for a period no longer than 30 days), to receive additional pain medication, for the acute post-op pain. However, in this case, you are responsible for picking up a copy of our "Post-op Pain Management for Surgeons" handout, and giving it to your surgeon or dentist. This document is available at our office, and does not require an appointment to obtain it. Simply go to our office during business hours (Monday-Thursday from 8:00 AM to 4:00 PM) (Friday 8:00 AM to 12:00 Noon) or if you have a scheduled appointment with [place], prior to your surgery, and ask for it by name. In addition, you will need to provide [place] with your name, name of your surgeon, type of surgery, and date of procedure or surgery.  *Opioid medications include: morphine, codeine, oxycodone, oxymorphone, hydrocodone, hydromorphone, meperidine, tramadol, tapentadol, buprenorphine, fentanyl, methadone. **Benzodiazepine medications include: diazepam (Valium), alprazolam (Xanax), clonazepam (Klonopine), lorazepam (Ativan), clorazepate (Tranxene), chlordiazepoxide (Librium), estazolam (Prosom), oxazepam (Serax), temazepam (Restoril), triazolam (Halcion)  ____________________________________________________________________________________________   BMI Assessment: Estimated body mass index is 35.44 kg/m as calculated from the following:   Height as of this encounter: 5\' 9"  (1.753 m).   Weight as of this encounter: 240 lb (108.9 kg).  BMI interpretation table: BMI level Category Range association with higher incidence of chronic  pain  <18 kg/m2 Underweight   18.5-24.9 kg/m2 Ideal body weight   25-29.9 kg/m2 Overweight Increased incidence by 20%  30-34.9 kg/m2 Obese (Class I) Increased incidence by 68%  35-39.9 kg/m2 Severe obesity (Class II) Increased incidence by 136%  >40 kg/m2 Extreme obesity (Class III) Increased incidence by 254%   BMI Readings from Last 4 Encounters:  09/13/17 35.44 kg/m  06/14/17 33.97 kg/m  06/12/17 34.30 kg/m  03/15/17 33.52 kg/m   Wt Readings from Last 4 Encounters:  09/13/17 240 lb (108.9 kg)  06/14/17 230 lb (104.3 kg)  06/12/17 232 lb 4.8 oz (105.4 kg)  03/15/17 227 lb (103 kg)   ____________________________________________________________________________________________  Medication Rules  Applies to: All patients receiving prescriptions (written or electronic).  Pharmacy of record: Pharmacy where electronic prescriptions will be sent. If written prescriptions are taken to a different pharmacy, please inform the nursing staff. The pharmacy listed in the electronic medical record should be the one where you would like electronic prescriptions to be sent.  Prescription refills: Only during scheduled appointments. Applies to both, written and electronic prescriptions.  NOTE: The following applies primarily to controlled substances (Opioid* Pain Medications).   Patient's responsibilities: 11. Pain Pills: Bring all pain pills to every appointment (except for procedure appointments). 12. Pill Bottles: Bring pills in original pharmacy bottle. Always bring newest bottle. Bring bottle, even if empty. 13. Medication refills: You are responsible for knowing and keeping track of what medications you need refilled. The day before your appointment, write a list of all prescriptions that need to be refilled. Bring that list to your appointment and give it to the admitting nurse. Prescriptions will be written only during appointments. If you forget a medication, it will not be "Called  in", "Faxed", or "electronically sent". You will need to get another appointment to get these prescribed. 14. Prescription  Accuracy: You are responsible for carefully inspecting your prescriptions before leaving our office. Have the discharge nurse carefully go over each prescription with you, before taking them home. Make sure that your name is accurately spelled, that your address is correct. Check the name and dose of your medication to make sure it is accurate. Check the number of pills, and the written instructions to make sure they are clear and accurate. Make sure that you are given enough medication to last until your next medication refill appointment. 15. Taking Medication: Take medication as prescribed. Never take more pills than instructed. Never take medication more frequently than prescribed. Taking less pills or less frequently is permitted and encouraged, when it comes to controlled substances (written prescriptions).  16. Inform other Doctors: Always inform, all of your healthcare providers, of all the medications you take. 17. Pain Medication from other Providers: You are not allowed to accept any additional pain medication from any other Doctor or Healthcare provider. There are two exceptions to this rule. (see below) In the event that you require additional pain medication, you are responsible for notifying us, as stated below. 18. Medication Agreement: You are responsible for carefully reading and following our Medication Agreement. This must be signed before receiving any prescriptions from our practice. Safely store a copy of your signed Agreement. Violations to the Agreement will result in no further prescriptions. (Additional copies of our Medication Agreement are available upon request.) 19. Laws, Rules, & Regulations: All patients are expected to follow all Federal and [employer], Rules, [employer]. Ignorance of the Laws does not constitute a valid excuse. The use of any  illegal substances is prohibited. 20. Adopted CDC guidelines & recommendations: Target dosing levels will be at or below 60 MME/day. Use of benzodiazepines** is not recommended.  Exceptions: There are only two exceptions to the rule of not receiving pain medications from other Healthcare Providers. 3. Exception #1 (Emergencies): In the event of an emergency (i.e.: accident requiring emergency care), you are allowed to receive additional pain medication. However, you are responsible for: As soon as you are able, call our office [phone], at any time of the day or night, and leave a message stating your name, the date and nature of the emergency, and the name and dose of the medication prescribed. In the event that your call is answered by a member of our staff, make sure to document and save the date, time, and the name of the person that took your information.  4. Exception #2 (Planned Surgery): In the event that you are scheduled by another doctor or dentist to have any type of surgery or procedure, you are allowed (for a period no longer than 30 days), to receive additional pain medication, for the acute post-op pain. However, in this case, you are responsible for picking up a copy of our "Post-op Pain Management for Surgeons" handout, and giving it to your surgeon or dentist. This document is available at our office, and does not require an appointment to obtain it. Simply go to our office during business hours (Monday-Thursday from 8:00 AM to 4:00 PM) (Friday 8:00 AM to 12:00 Noon) or if you have a scheduled appointment with [place], prior to your surgery, and ask for it by name. In addition, you will need to provide [place] with your name, name of your surgeon, type of surgery, and date of procedure or surgery.  *Opioid medications include: morphine, codeine, oxycodone, oxymorphone, hydrocodone, hydromorphone, meperidine, tramadol, tapentadol, buprenorphine, fentanyl,  methadone. **Benzodiazepine medications  include: diazepam (Valium), alprazolam (Xanax), clonazepam (Klonopine), lorazepam (Ativan), clorazepate (Tranxene), chlordiazepoxide (Librium), estazolam (Prosom), oxazepam (Serax), temazepam (Restoril), triazolam (Halcion)  You were given 3 prescriptions for Morphine today.  ____________________________________________________________________________________________

## 2017-09-13 NOTE — Progress Notes (Signed)
Patient's Name: Joel Bryant  MRN: [id number]  Referring Provider: Roselee Nova, MD  DOB: December 20, 1960  PCP: Roselee Nova, MD  DOS: 09/13/2017  Note by: Vevelyn Francois NP  Service setting: Ambulatory outpatient  Specialty: Interventional Pain Management  Location: [facility] (AMB) Pain Management Facility    Patient type: Established    Primary Reason(s) for Visit: Encounter for prescription drug management. (Level of risk: moderate)  CC: Back Pain (lower)  HPI  Joel Bryant is a 56 y.o. year old, male patient, who comes today for a medication management evaluation. 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); Dietary counseling and surveillance; Long term current use of opiate analgesic; Long term prescription opiate use; Opiate use (30 MME/Day); Encounter for therapeutic drug level monitoring; Encounter for chronic pain management; Lumbar spondylosis; Failed back surgical syndrome; Epidural fibrosis; Chronic low back pain (Location of Primary Source of Pain) (Bilateral) (L>R); Hypomagnesemia; Encounter for long-term (current) use of other high-risk medications; Encounter for opiate analgesic use agreement; Opioid use agreement exists; Chronic left knee pain; Lumbar spinal stenosis (L4-5); 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 His primarily concern today is the Back Pain (lower)  Pain Assessment: Location: Lower Back Radiating: both hips Onset: More than a month ago Duration: Chronic pain Quality: Stabbing, Sharp Severity: 4 /10 (self-reported pain score)  Note: Reported level is compatible with observation.                          Effect on ADL:   Timing: Constant Modifying factors: sitting in recliner  Joel Bryant was last scheduled for an appointment on 06/14/2017 for medication management. During today's appointment we reviewed Joel Bryant's chronic pain status, as well as his outpatient medication regimen. He states that he is having increased pain. He states that he the weather makes his pain worse.  He states that the muscle relaxer are effective. He states that he hips are "bone on bone". He is also having increased right wrist pain. He going to follow up with a hand surgeon for this. He states that he Methadone was the most effective.   The patient  reports that he does not use drugs. His body mass index is 35.44 kg/m.  Further details on both, my assessment(s), as well as the proposed treatment plan, please see below.  Controlled Substance Pharmacotherapy Assessment REMS (Risk Evaluation and Mitigation Strategy)  Analgesic:MS Contin (morphine ER) 15 mg every 12 hours (30 mg/day) MME/day:30 mg/day  Joel Martins, RN  09/13/2017  8:15 AM  Sign at close encounter Nursing Pain Medication Assessment:  Safety precautions to be maintained throughout the outpatient stay will include: orient to surroundings, keep bed in low position, maintain call bell within reach at all times, provide assistance with transfer out of bed and ambulation.  Medication Inspection Compliance: Pill count conducted under aseptic conditions, in front of the  patient. Neither the pills nor the bottle was removed from the  patient's sight at any time. Once count was completed pills were immediately returned to the patient in their original bottle.  Medication: Morphine ER (MSContin) Pill/Patch Count: 1 of 60 pills remain Pill/Patch Appearance: Markings consistent with prescribed medication Bottle Appearance: Standard pharmacy container. Clearly labeled. Filled Date: 10/20/ 2018 Last Medication intake:  Today   Pharmacokinetics: Liberation and absorption (onset of action): WNL Distribution (time to peak effect): WNL Metabolism and excretion (duration of action): WNL         Pharmacodynamics: Desired effects: Analgesia: Joel Bryant reports >50% benefit. Functional ability: Patient reports that medication allows him to accomplish basic ADLs Clinically meaningful improvement in function (CMIF): Sustained CMIF goals met Perceived effectiveness: Described as relatively effective, allowing for increase in activities of daily living (ADL) Undesirable effects: Side-effects or Adverse reactions: None reported Monitoring: [place] PMP: Online review of the past 4-monthperiod conducted. Compliant with practice rules and regulations Last UDS on record: No results found for: SUMMARY UDS interpretation: Compliant          Medication Assessment Form: Reviewed. Patient indicates being compliant with therapy Treatment compliance: Compliant Risk Assessment Profile: Aberrant behavior: request for specific drugs or requesting "Brand Name" drugs Comorbid factors increasing risk of overdose: See prior notes. No additional risks detected today Risk of substance use disorder (SUD): Low  ORT Scoring interpretation table:  Score <3 = Low Risk for SUD  Score between 4-7 = Moderate Risk for SUD  Score >8 = High Risk for Opioid Abuse   Risk Mitigation Strategies:  Patient Counseling: Covered Patient-Prescriber Agreement (PPA): Present and active  Notification to other healthcare providers: Done  Pharmacologic Plan: No change  in therapy, at this time  Laboratory Chemistry  Inflammation Markers (CRP: Acute Phase) (ESR: Chronic Phase) Lab Results  Component Value Date   CRP <0.5 09/27/2015   ESRSEDRATE 4 09/27/2015                 Renal Function Markers Lab Results  Component Value Date   BUN 9 06/14/2017   CREATININE 0.78 06/14/2017   GFRAA 116 06/14/2017   GFRNONAA 100 06/14/2017                 Hepatic Function Markers Lab Results  Component Value Date   AST 36 06/14/2017   ALT 23 06/14/2017   ALBUMIN 4.8 06/14/2017   ALKPHOS 61 06/14/2017                 Electrolytes Lab Results  Component Value Date   NA 142 06/14/2017   K 4.6 06/14/2017   CL 100 06/14/2017   CALCIUM 9.8 06/14/2017   MG 1.3 (L) 09/27/2015                 Neuropathy Markers No results found for: [id number]              Bone Pathology Markers Lab Results  Component Value Date   ALKPHOS 61 06/14/2017   25OHVITD1 17 (L) 09/27/2015   25OHVITD2 <1.0 09/27/2015   25OHVITD3 17 09/27/2015   CALCIUM 9.8 06/14/2017                 Rheumatology Markers No results found for: LABURIC, URICUR              Coagulation Parameters Lab Results  Component Value Date   PLT 211 11/18/2011  Cardiovascular Markers Lab Results  Component Value Date   HGB 15.9 11/18/2011   HCT 45.8 11/18/2011                 CA Markers No results found for: CEA, CA125, LABCA2               Note: Lab results reviewed.  Recent Diagnostic Imaging Results   Complexity Note: Imaging results reviewed. Results shared with Mr. Marotto, using Layman's terms.                         Meds   Current Outpatient Medications:  .  ACCU-CHEK SOFTCLIX LANCETS lancets, USE AS DIRECTED TO CHECK BLOOD SUGAR TWICE A DAY, Disp: 100 each, Rfl: 2 .  aspirin 81 MG tablet, Take 81 mg by mouth daily., Disp: , Rfl:  .  atenolol (TENORMIN) 100 MG tablet, Take 1 tablet (100 mg total) by mouth daily., Disp: 90 tablet, Rfl: 1 .  atorvastatin  (LIPITOR) 20 MG tablet, Take 1 tablet (20 mg total) by mouth daily at 6 PM., Disp: 90 tablet, Rfl: 1 .  Biotin 5000 MCG CAPS, Take 5,000 capsules by mouth 2 (two) times daily., Disp: , Rfl:  .  Cholecalciferol (VITAMIN D3) 2000 units TABS, Take 2,000 tablets by mouth daily., Disp: , Rfl:  .  diphenhydramine-acetaminophen (TYLENOL PM) 25-500 MG TABS tablet, Take 1 tablet by mouth at bedtime as needed., Disp: , Rfl:  .  esomeprazole (NEXIUM) 40 MG capsule, Take 1 capsule (40 mg total) by mouth daily., Disp: 90 capsule, Rfl: 1 .  furosemide (LASIX) 40 MG tablet, TAKE 1 TABLET BY MOUTH ONCE DAILY, Disp: 90 tablet, Rfl: 0 .  ibuprofen (ADVIL,MOTRIN) 800 MG tablet, Take 800 mg by mouth every 8 (eight) hours as needed., Disp: , Rfl:  .  lisinopril (PRINIVIL,ZESTRIL) 2.5 MG tablet, Take 1 tablet (2.5 mg total) by mouth daily., Disp: 90 tablet, Rfl: 1 .  metFORMIN (GLUCOPHAGE) 1000 MG tablet, Take 1 tablet (1,000 mg total) by mouth 2 (two) times daily., Disp: 180 tablet, Rfl: 1 .  [START ON 11/16/2017] morphine (MS CONTIN) 15 MG 12 hr tablet, Take 1 tablet (15 mg total) every 12 (twelve) hours by mouth., Disp: 60 tablet, Rfl: 0 .  Multiple Vitamins-Minerals (SENTRY SENIOR PO), Take 1 tablet by mouth., Disp: , Rfl:  .  omega-3 acid ethyl esters (LOVAZA) 1 g capsule, Take 2 capsules (2 g total) by mouth 2 (two) times daily., Disp: 360 capsule, Rfl: 1 .  sitaGLIPtin (JANUVIA) 50 MG tablet, Take 1 tablet (50 mg total) by mouth daily., Disp: 90 tablet, Rfl: 1 .  clonazePAM (KLONOPIN) 0.5 MG tablet, Take 1 tablet (0.5 mg total) at bedtime as needed by mouth for anxiety., Disp: 90 tablet, Rfl: 0 .  clonazePAM (KLONOPIN) 1 MG tablet, Take 1 tablet (1 mg total) 2 (two) times daily as needed by mouth., Disp: 180 tablet, Rfl: 0 .  [START ON 10/17/2017] morphine (MS CONTIN) 15 MG 12 hr tablet, Take 1 tablet (15 mg total) every 12 (twelve) hours by mouth., Disp: 60 tablet, Rfl: 0 .  [START ON 09/17/2017] morphine (MS  CONTIN) 15 MG 12 hr tablet, Take 1 tablet (15 mg total) every 12 (twelve) hours by mouth., Disp: 60 tablet, Rfl: 0 .  pregabalin (LYRICA) 50 MG capsule, Take 1 capsule (50 mg total) 3 (three) times daily by mouth., Disp: 270 capsule, Rfl: 0  ROS  Constitutional: Denies any fever or  chills Gastrointestinal: No reported hemesis, hematochezia, vomiting, or acute GI distress Musculoskeletal: Denies any acute onset joint swelling, redness, loss of ROM, or weakness Neurological: No reported episodes of acute onset apraxia, aphasia, dysarthria, agnosia, amnesia, paralysis, loss of coordination, or loss of consciousness  Allergies  Mr. Goin has No Known Allergies.  PFSH  Drug: Mr. Enderson  reports that he does not use drugs. Alcohol:  reports that he drinks alcohol. Tobacco:  reports that he has quit smoking. His smoking use included cigarettes. he has never used smokeless tobacco. Medical:  has a past medical history of Anxiety, Chronic LBP (09/13/2015), Chronic left hip pain (09/27/2015), Diabetes mellitus without complication ([place]), GERD (gastroesophageal reflux disease), Hyperlipidemia, Hyperlipidemia, Hypertension, and Hypomagnesemia. Surgical: Mr. Mccollam  has a past surgical history that includes Laminectomy (2001); Cataract extraction (1992); Wrist surgery (Right); Eye surgery; Tonsillectomy; Tibia fracture surgery; Colonoscopy with propofol (N/A, 02/28/2017); and Multiple tooth extractions. Family: family history includes Alcohol abuse in his father; Cancer in his maternal grandfather; Colon polyps in his mother; Diabetes in his paternal grandmother; Retinitis pigmentosa in his maternal grandmother and mother.  Constitutional Exam  General appearance: Well nourished, well developed, and well hydrated. In no apparent acute distress Vitals:   09/13/17 0808  BP: 137/85  Pulse: 79  Resp: 16  Temp: 98 F (36.7 C)  TempSrc: Oral  SpO2: 98%  Weight: 240 lb (108.9 kg)  Height: '5\' 9"'  (1.753 m)    BMI Assessment: Estimated body mass index is 35.44 kg/m as calculated from the following:   Height as of this encounter: '5\' 9"'  (1.753 m).   Weight as of this encounter: 240 lb (108.9 kg). Psych/Mental status: Alert, oriented x 3 (person, place, & time)       Eyes: PERLA Respiratory: No evidence of acute respiratory distress  Cervical Spine Area Exam  Skin & Axial Inspection: No masses, redness, edema, swelling, or associated skin lesions Alignment: Symmetrical Functional ROM: Unrestricted ROM      Stability: No instability detected Muscle Tone/Strength: Functionally intact. No obvious neuro-muscular anomalies detected. Sensory (Neurological): Unimpaired Palpation: No palpable anomalies              Upper Extremity (UE) Exam    Side: Right upper extremity  Side: Left upper extremity  Skin & Extremity Inspection: Skin color, temperature, and hair growth are WNL. No peripheral edema or cyanosis. No masses, redness, swelling, asymmetry, or associated skin lesions. No contractures.  Skin & Extremity Inspection: Skin color, temperature, and hair growth are WNL. No peripheral edema or cyanosis. No masses, redness, swelling, asymmetry, or associated skin lesions. No contractures.  Functional ROM: Unrestricted ROM          Functional ROM: Unrestricted ROM          Muscle Tone/Strength: Functionally intact. No obvious neuro-muscular anomalies detected.  Muscle Tone/Strength: Functionally intact. No obvious neuro-muscular anomalies detected.  Sensory (Neurological): Unimpaired          Sensory (Neurological): Unimpaired          Palpation: No palpable anomalies              Palpation: No palpable anomalies              Specialized Test(s): Deferred         Specialized Test(s): Deferred          Thoracic Spine Area Exam  Skin & Axial Inspection: No masses, redness, or swelling Alignment: Symmetrical Functional ROM: Unrestricted ROM Stability: No instability detected Muscle  Tone/Strength:  Functionally intact. No obvious neuro-muscular anomalies detected. Sensory (Neurological): Unimpaired Muscle strength & Tone: No palpable anomalies  Lumbar Spine Area Exam  Skin & Axial Inspection: No masses, redness, or swelling Alignment: Symmetrical Functional ROM: Unrestricted ROM      Stability: No instability detected Muscle Tone/Strength: Functionally intact. No obvious neuro-muscular anomalies detected. Sensory (Neurological): Unimpaired Palpation: No palpable anomalies       Provocative Tests: Lumbar Hyperextension and rotation test: evaluation deferred today       Lumbar Lateral bending test: evaluation deferred today       Patrick's Maneuver: Positive                    Gait & Posture Assessment  Ambulation: Unassisted Gait: Relatively normal for age and body habitus Posture: WNL   Lower Extremity Exam    Side: Right lower extremity  Side: Left lower extremity  Skin & Extremity Inspection: Skin color, temperature, and hair growth are WNL. No peripheral edema or cyanosis. No masses, redness, swelling, asymmetry, or associated skin lesions. No contractures.  Skin & Extremity Inspection: Skin color, temperature, and hair growth are WNL. No peripheral edema or cyanosis. No masses, redness, swelling, asymmetry, or associated skin lesions. No contractures.  Functional ROM: Unrestricted ROM          Functional ROM: Unrestricted ROM          Muscle Tone/Strength: Functionally intact. No obvious neuro-muscular anomalies detected.  Muscle Tone/Strength: Functionally intact. No obvious neuro-muscular anomalies detected.  Sensory (Neurological): Unimpaired  Sensory (Neurological): Unimpaired  Palpation: No palpable anomalies  Palpation: No palpable anomalies   Assessment  Primary Diagnosis & Pertinent Problem List: The primary encounter diagnosis was Chronic hip pain, unspecified laterality. Diagnoses of Chronic low back pain (Location of Primary Source of Pain) (Bilateral) (L>R),  Lumbar facet syndrome (Bilateral), Chronic pain syndrome, and Long term current use of opiate analgesic were also pertinent to this visit.  Status Diagnosis  Persistent Persistent Persistent 1. Chronic hip pain, unspecified laterality   2. Chronic low back pain (Location of Primary Source of Pain) (Bilateral) (L>R)   3. Lumbar facet syndrome (Bilateral)   4. Chronic pain syndrome   5. Long term current use of opiate analgesic     Problems updated and reviewed during this visit: No problems updated. Plan of Care  Pharmacotherapy (Medications Ordered): Meds ordered this encounter  Medications  . morphine (MS CONTIN) 15 MG 12 hr tablet    Sig: Take 1 tablet (15 mg total) every 12 (twelve) hours by mouth.    Dispense:  60 tablet    Refill:  0    Do not place this medication, or any other prescription from our practice, on "Automatic Refill". Patient may have prescription filled one day early if pharmacy is closed on scheduled refill date. Do not fill until: 11/16/2017 To last until:12/16/2017    Order Specific Question:   Supervising Provider    Answer:   Milinda Pointer [phone]  . morphine (MS CONTIN) 15 MG 12 hr tablet    Sig: Take 1 tablet (15 mg total) every 12 (twelve) hours by mouth.    Dispense:  60 tablet    Refill:  0    Do not place this medication, or any other prescription from our practice, on "Automatic Refill". Patient may have prescription filled one day early if pharmacy is closed on scheduled refill date. Do not fill until: 10/17/2017 To last until:11/16/2017    Order Specific Question:  Supervising Provider    Answer:   Milinda Pointer [phone]  . morphine (MS CONTIN) 15 MG 12 hr tablet    Sig: Take 1 tablet (15 mg total) every 12 (twelve) hours by mouth.    Dispense:  60 tablet    Refill:  0    Do not place this medication, or any other prescription from our practice, on "Automatic Refill". Patient may have prescription filled one day early if pharmacy is  closed on scheduled refill date. Do not fill until:09/17/2017 To last until:10/17/2017    Order Specific Question:   Supervising Provider    Answer:   Milinda Pointer [[id number]]  This SmartLink is deprecated. Use AVSMEDLIST instead to display the medication list for a patient. Medications administered today: Despina Arias had no medications administered during this visit. Lab-work, procedure(s), and/or referral(s): Orders Placed This Encounter  Procedures  . ToxASSURE Select 13 (MW), Urine   Imaging and/or referral(s): None  Interventional therapies: Planned, scheduled, and/or pending:  Not at this time.    Considering:  Diagnostic bilateral lumbar facet block Possible spinal cord stimulator trial.   Palliative PRN treatment(s):  Diagnostic bilateral lumbar facet block   Provider-requested follow-up: Return in about 3 months (around 12/14/2017) for MedMgmt.  Future Appointments  Date Time Provider [name]  12/13/2017  8:30 AM Vevelyn Francois, NP [facility] None  12/13/2017 10:40 AM Roselee Nova, MD [facility]   Primary Care Physician: Roselee Nova, MD Location: [facility] Note by: Vevelyn Francois NP Date: 09/13/2017; Time: 12:33 PM  Pain Score Disclaimer: We use the NRS-11 scale. This is a self-reported, subjective measurement of pain severity with only modest accuracy. It is used primarily to identify changes within a particular patient. It must be understood that outpatient pain scales are significantly less accurate that those used for research, where they can be applied under ideal controlled circumstances with minimal exposure to variables. In reality, the score is likely to be a combination of pain intensity and pain affect, where pain affect describes the degree of emotional arousal or changes in action readiness caused by the sensory experience of pain. Factors such as social and work situation, setting,  emotional state, anxiety levels, expectation, and prior pain experience may influence pain perception and show large inter-individual differences that may also be affected by time variables.  Patient instructions provided during this appointment: Patient Instructions    ____________________________________________________________________________________________  Medication Rules  Applies to: All patients receiving prescriptions (written or electronic).  Pharmacy of record: Pharmacy where electronic prescriptions will be sent. If written prescriptions are taken to a different pharmacy, please inform the nursing staff. The pharmacy listed in the electronic medical record should be the one where you would like electronic prescriptions to be sent.  Prescription refills: Only during scheduled appointments. Applies to both, written and electronic prescriptions.  NOTE: The following applies primarily to controlled substances (Opioid* Pain Medications).   Patient's responsibilities: 1. Pain Pills: Bring all pain pills to every appointment (except for procedure appointments). 2. Pill Bottles: Bring pills in original pharmacy bottle. Always bring newest bottle. Bring bottle, even if empty. 3. Medication refills: You are responsible for knowing and keeping track of what medications you need refilled. The day before your appointment, write a list of all prescriptions that need to be refilled. Bring that list to your appointment and give it to the admitting nurse. Prescriptions will be written only during appointments. If you forget a medication,  it will not be "Called in", "Faxed", or "electronically sent". You will need to get another appointment to get these prescribed. 4. Prescription Accuracy: You are responsible for carefully inspecting your prescriptions before leaving our office. Have the discharge nurse carefully go over each prescription with you, before taking them home. Make sure that your name  is accurately spelled, that your address is correct. Check the name and dose of your medication to make sure it is accurate. Check the number of pills, and the written instructions to make sure they are clear and accurate. Make sure that you are given enough medication to last until your next medication refill appointment. 5. Taking Medication: Take medication as prescribed. Never take more pills than instructed. Never take medication more frequently than prescribed. Taking less pills or less frequently is permitted and encouraged, when it comes to controlled substances (written prescriptions).  6. Inform other Doctors: Always inform, all of your healthcare providers, of all the medications you take. 7. Pain Medication from other Providers: You are not allowed to accept any additional pain medication from any other Doctor or Healthcare provider. There are two exceptions to this rule. (see below) In the event that you require additional pain medication, you are responsible for notifying us, as stated below. 8. Medication Agreement: You are responsible for carefully reading and following our Medication Agreement. This must be signed before receiving any prescriptions from our practice. Safely store a copy of your signed Agreement. Violations to the Agreement will result in no further prescriptions. (Additional copies of our Medication Agreement are available upon request.) 9. Laws, Rules, & Regulations: All patients are expected to follow all Federal and [employer], Rules, [employer]. Ignorance of the Laws does not constitute a valid excuse. The use of any illegal substances is prohibited. 10. Adopted CDC guidelines & recommendations: Target dosing levels will be at or below 60 MME/day. Use of benzodiazepines** is not recommended.  Exceptions: There are only two exceptions to the rule of not receiving pain medications from other Healthcare Providers. 1. Exception #1 (Emergencies): In the event of  an emergency (i.e.: accident requiring emergency care), you are allowed to receive additional pain medication. However, you are responsible for: As soon as you are able, call our office [phone], at any time of the day or night, and leave a message stating your name, the date and nature of the emergency, and the name and dose of the medication prescribed. In the event that your call is answered by a member of our staff, make sure to document and save the date, time, and the name of the person that took your information.  2. Exception #2 (Planned Surgery): In the event that you are scheduled by another doctor or dentist to have any type of surgery or procedure, you are allowed (for a period no longer than 30 days), to receive additional pain medication, for the acute post-op pain. However, in this case, you are responsible for picking up a copy of our "Post-op Pain Management for Surgeons" handout, and giving it to your surgeon or dentist. This document is available at our office, and does not require an appointment to obtain it. Simply go to our office during business hours (Monday-Thursday from 8:00 AM to 4:00 PM) (Friday 8:00 AM to 12:00 Noon) or if you have a scheduled appointment with [place], prior to your surgery, and ask for it by name. In addition, you will need to provide [place] with your name, name of your surgeon,  type of surgery, and date of procedure or surgery.  *Opioid medications include: morphine, codeine, oxycodone, oxymorphone, hydrocodone, hydromorphone, meperidine, tramadol, tapentadol, buprenorphine, fentanyl, methadone. **Benzodiazepine medications include: diazepam (Valium), alprazolam (Xanax), clonazepam (Klonopine), lorazepam (Ativan), clorazepate (Tranxene), chlordiazepoxide (Librium), estazolam (Prosom), oxazepam (Serax), temazepam (Restoril), triazolam (Halcion)  ____________________________________________________________________________________________   BMI Assessment: Estimated  body mass index is 35.44 kg/m as calculated from the following:   Height as of this encounter: '5\' 9"'  (1.753 m).   Weight as of this encounter: 240 lb (108.9 kg).  BMI interpretation table: BMI level Category Range association with higher incidence of chronic pain  <18 kg/m2 Underweight   18.5-24.9 kg/m2 Ideal body weight   25-29.9 kg/m2 Overweight Increased incidence by 20%  30-34.9 kg/m2 Obese (Class I) Increased incidence by 68%  35-39.9 kg/m2 Severe obesity (Class II) Increased incidence by 136%  >40 kg/m2 Extreme obesity (Class III) Increased incidence by 254%   BMI Readings from Last 4 Encounters:  09/13/17 35.44 kg/m  06/14/17 33.97 kg/m  06/12/17 34.30 kg/m  03/15/17 33.52 kg/m   Wt Readings from Last 4 Encounters:  09/13/17 240 lb (108.9 kg)  06/14/17 230 lb (104.3 kg)  06/12/17 232 lb 4.8 oz (105.4 kg)  03/15/17 227 lb (103 kg)   ____________________________________________________________________________________________  Medication Rules  Applies to: All patients receiving prescriptions (written or electronic).  Pharmacy of record: Pharmacy where electronic prescriptions will be sent. If written prescriptions are taken to a different pharmacy, please inform the nursing staff. The pharmacy listed in the electronic medical record should be the one where you would like electronic prescriptions to be sent.  Prescription refills: Only during scheduled appointments. Applies to both, written and electronic prescriptions.  NOTE: The following applies primarily to controlled substances (Opioid* Pain Medications).   Patient's responsibilities: 11. Pain Pills: Bring all pain pills to every appointment (except for procedure appointments). 12. Pill Bottles: Bring pills in original pharmacy bottle. Always bring newest bottle. Bring bottle, even if empty. 13. Medication refills: You are responsible for knowing and keeping track of what medications you need refilled. The day  before your appointment, write a list of all prescriptions that need to be refilled. Bring that list to your appointment and give it to the admitting nurse. Prescriptions will be written only during appointments. If you forget a medication, it will not be "Called in", "Faxed", or "electronically sent". You will need to get another appointment to get these prescribed. 14. Prescription Accuracy: You are responsible for carefully inspecting your prescriptions before leaving our office. Have the discharge nurse carefully go over each prescription with you, before taking them home. Make sure that your name is accurately spelled, that your address is correct. Check the name and dose of your medication to make sure it is accurate. Check the number of pills, and the written instructions to make sure they are clear and accurate. Make sure that you are given enough medication to last until your next medication refill appointment. 15. Taking Medication: Take medication as prescribed. Never take more pills than instructed. Never take medication more frequently than prescribed. Taking less pills or less frequently is permitted and encouraged, when it comes to controlled substances (written prescriptions).  16. Inform other Doctors: Always inform, all of your healthcare providers, of all the medications you take. 17. Pain Medication from other Providers: You are not allowed to accept any additional pain medication from any other Doctor or Healthcare provider. There are two exceptions to this rule. (see below) In the event that  you require additional pain medication, you are responsible for notifying us, as stated below. 18. Medication Agreement: You are responsible for carefully reading and following our Medication Agreement. This must be signed before receiving any prescriptions from our practice. Safely store a copy of your signed Agreement. Violations to the Agreement will result in no further prescriptions. (Additional  copies of our Medication Agreement are available upon request.) 19. Laws, Rules, & Regulations: All patients are expected to follow all Federal and [employer], [employer], Rules, [employer]. Ignorance of the Laws does not constitute a valid excuse. The use of any illegal substances is prohibited. 20. Adopted CDC guidelines & recommendations: Target dosing levels will be at or below 60 MME/day. Use of benzodiazepines** is not recommended.  Exceptions: There are only two exceptions to the rule of not receiving pain medications from other Healthcare Providers. 3. Exception #1 (Emergencies): In the event of an emergency (i.e.: accident requiring emergency care), you are allowed to receive additional pain medication. However, you are responsible for: As soon as you are able, call our office [phone], at any time of the day or night, and leave a message stating your name, the date and nature of the emergency, and the name and dose of the medication prescribed. In the event that your call is answered by a member of our staff, make sure to document and save the date, time, and the name of the person that took your information.  4. Exception #2 (Planned Surgery): In the event that you are scheduled by another doctor or dentist to have any type of surgery or procedure, you are allowed (for a period no longer than 30 days), to receive additional pain medication, for the acute post-op pain. However, in this case, you are responsible for picking up a copy of our "Post-op Pain Management for Surgeons" handout, and giving it to your surgeon or dentist. This document is available at our office, and does not require an appointment to obtain it. Simply go to our office during business hours (Monday-Thursday from 8:00 AM to 4:00 PM) (Friday 8:00 AM to 12:00 Noon) or if you have a scheduled appointment with [place], prior to your surgery, and ask for it by name. In addition, you will need to provide [place] with your name, name of  your surgeon, type of surgery, and date of procedure or surgery.  *Opioid medications include: morphine, codeine, oxycodone, oxymorphone, hydrocodone, hydromorphone, meperidine, tramadol, tapentadol, buprenorphine, fentanyl, methadone. **Benzodiazepine medications include: diazepam (Valium), alprazolam (Xanax), clonazepam (Klonopine), lorazepam (Ativan), clorazepate (Tranxene), chlordiazepoxide (Librium), estazolam (Prosom), oxazepam (Serax), temazepam (Restoril), triazolam (Halcion)  You were given 3 prescriptions for Morphine today.  ____________________________________________________________________________________________

## 2017-09-13 NOTE — Progress Notes (Signed)
Name: Joel Bryant   MRN: [id number]    DOB: [birth year]   Date:09/13/2017       Progress Note  Subjective  Chief Complaint  Chief Complaint  Patient presents with  . Diabetes    f/u  . Hypertension    f/u  . Hyperlipidemia    f/u  . Medication Refill    Diabetes  He presents for his follow-up diabetic visit. He has type 2 diabetes mellitus. His disease course has been stable. Pertinent negatives for hypoglycemia include no dizziness or nervousness/anxiousness. Pertinent negatives for diabetes include no blurred vision, no foot paresthesias, no polydipsia and no polyuria. Pertinent negatives for diabetic complications include no CVA. Current diabetic treatment includes oral agent (dual therapy). He is following a generally healthy diet. He monitors blood glucose at home 1-2 x per week. His breakfast blood glucose range is generally 110-130 mg/dl. An ACE inhibitor/angiotensin II receptor blocker is being taken. Eye exam is current.  Hypertension  This is a chronic problem. The problem is unchanged. The problem is controlled. Pertinent negatives include no anxiety, blurred vision, palpitations or shortness of breath. Past treatments include beta blockers. There is no history of kidney disease, CAD/MI or CVA.  Hyperlipidemia  This is a chronic problem. The problem is controlled. Recent lipid tests were reviewed and are normal. Pertinent negatives include no leg pain or shortness of breath. Current antihyperlipidemic treatment includes statins.  Anxiety  Presents for follow-up visit. Symptoms include insomnia. Patient reports no depressed mood, dizziness, excessive worry, irritability, nervous/anxious behavior, palpitations or shortness of breath. The severity of symptoms is moderate.      Past Medical History:  Diagnosis Date  . Anxiety   . Chronic LBP 09/13/2015  . Chronic left hip pain 09/27/2015  . Diabetes mellitus without complication ([place])   . GERD (gastroesophageal reflux  disease)   . Hyperlipidemia   . Hyperlipidemia   . Hypertension   . Hypomagnesemia     Past Surgical History:  Procedure Laterality Date  . CATARACT EXTRACTION  1992   Insert Prosthetic Lens  . COLONOSCOPY WITH PROPOFOL N/A 02/28/2017   Procedure: COLONOSCOPY WITH PROPOFOL;  Surgeon: Manya Silvas, MD;  Location: [facility] ENDOSCOPY;  Service: Endoscopy;  Laterality: N/A;  . EYE SURGERY    . LAMINECTOMY  2001   lumbar  . MULTIPLE TOOTH EXTRACTIONS     2018  . TIBIA FRACTURE SURGERY    . TONSILLECTOMY    . WRIST SURGERY Right    x2    Family History  Problem Relation Age of Onset  . Colon polyps Mother   . Retinitis pigmentosa Mother   . Retinitis pigmentosa Maternal Grandmother   . Cancer Maternal Grandfather   . Diabetes Paternal Grandmother   . Alcohol abuse Father     Social History   Socioeconomic History  . Marital status: Divorced    Spouse name: Not on file  . Number of children: Not on file  . Years of education: Not on file  . Highest education level: Not on file  Social Needs  . Financial resource strain: Not on file  . Food insecurity - worry: Not on file  . Food insecurity - inability: Not on file  . Transportation needs - medical: Not on file  . Transportation needs - non-medical: Not on file  Occupational History  . Not on file  Tobacco Use  . Smoking status: Former Smoker    Types: Cigarettes  . Smokeless tobacco: Never Used  .  Tobacco comment: Quit smoking 4 years now  Substance and Sexual Activity  . Alcohol use: Yes    Alcohol/week: 0.0 oz    Comment: occassional/seldom  . Drug use: No    Comment: in past used marijuana   . Sexual activity: Not on file  Other Topics Concern  . Not on file  Social History Narrative  . Not on file     Current Outpatient Medications:  .  ACCU-CHEK SOFTCLIX LANCETS lancets, USE AS DIRECTED TO CHECK BLOOD SUGAR TWICE A DAY, Disp: 100 each, Rfl: 2 .  aspirin 81 MG tablet, Take 81 mg by mouth daily., Disp:  , Rfl:  .  atenolol (TENORMIN) 100 MG tablet, Take 1 tablet (100 mg total) by mouth daily., Disp: 90 tablet, Rfl: 1 .  atorvastatin (LIPITOR) 20 MG tablet, Take 1 tablet (20 mg total) by mouth daily at 6 PM., Disp: 90 tablet, Rfl: 1 .  Biotin 5000 MCG CAPS, Take 5,000 capsules by mouth 2 (two) times daily., Disp: , Rfl:  .  Cholecalciferol (VITAMIN D3) 2000 units TABS, Take 2,000 tablets by mouth daily., Disp: , Rfl:  .  clonazePAM (KLONOPIN) 0.5 MG tablet, Take 1 tablet (0.5 mg total) by mouth at bedtime as needed for anxiety., Disp: 90 tablet, Rfl: 0 .  clonazePAM (KLONOPIN) 1 MG tablet, Take 1 tablet (1 mg total) by mouth 2 (two) times daily as needed., Disp: 180 tablet, Rfl: 0 .  diphenhydramine-acetaminophen (TYLENOL PM) 25-500 MG TABS tablet, Take 1 tablet by mouth at bedtime as needed., Disp: , Rfl:  .  esomeprazole (NEXIUM) 40 MG capsule, Take 1 capsule (40 mg total) by mouth daily., Disp: 90 capsule, Rfl: 1 .  furosemide (LASIX) 40 MG tablet, TAKE 1 TABLET BY MOUTH ONCE DAILY, Disp: 90 tablet, Rfl: 0 .  ibuprofen (ADVIL,MOTRIN) 800 MG tablet, Take 800 mg by mouth every 8 (eight) hours as needed., Disp: , Rfl:  .  lisinopril (PRINIVIL,ZESTRIL) 2.5 MG tablet, Take 1 tablet (2.5 mg total) by mouth daily., Disp: 90 tablet, Rfl: 1 .  metFORMIN (GLUCOPHAGE) 1000 MG tablet, Take 1 tablet (1,000 mg total) by mouth 2 (two) times daily., Disp: 180 tablet, Rfl: 1 .  [START ON 11/16/2017] morphine (MS CONTIN) 15 MG 12 hr tablet, Take 1 tablet (15 mg total) every 12 (twelve) hours by mouth., Disp: 60 tablet, Rfl: 0 .  [START ON 10/17/2017] morphine (MS CONTIN) 15 MG 12 hr tablet, Take 1 tablet (15 mg total) every 12 (twelve) hours by mouth., Disp: 60 tablet, Rfl: 0 .  [START ON 09/17/2017] morphine (MS CONTIN) 15 MG 12 hr tablet, Take 1 tablet (15 mg total) every 12 (twelve) hours by mouth., Disp: 60 tablet, Rfl: 0 .  Multiple Vitamins-Minerals (SENTRY SENIOR PO), Take 1 tablet by mouth., Disp: , Rfl:  .   omega-3 acid ethyl esters (LOVAZA) 1 g capsule, Take 2 capsules (2 g total) by mouth 2 (two) times daily., Disp: 360 capsule, Rfl: 1 .  pregabalin (LYRICA) 50 MG capsule, Take 1 capsule (50 mg total) by mouth 3 (three) times daily., Disp: 270 capsule, Rfl: 0 .  sitaGLIPtin (JANUVIA) 50 MG tablet, Take 1 tablet (50 mg total) by mouth daily., Disp: 90 tablet, Rfl: 1  No Known Allergies   Review of Systems  Constitutional: Negative for irritability.  Eyes: Negative for blurred vision.  Respiratory: Negative for shortness of breath.   Cardiovascular: Negative for palpitations.  Neurological: Negative for dizziness.  Endo/Heme/Allergies: Negative for polydipsia.  Psychiatric/Behavioral:  The patient has insomnia. The patient is not nervous/anxious.      Objective  Vitals:   09/13/17 0948  BP: (!) 142/78  Pulse: 68  Resp: 14  Temp: 98.5 F (36.9 C)  TempSrc: Oral  SpO2: 96%  Weight: 238 lb 12.8 oz (108.3 kg)  Height: 5\' 9"  (1.753 m)    Physical Exam  Constitutional: He is oriented to person, place, and time and well-developed, well-nourished, and in no distress.  Cardiovascular: Normal rate, regular rhythm and normal heart sounds.  Pulmonary/Chest: Effort normal and breath sounds normal. He has no wheezes.  Abdominal: Soft. Bowel sounds are normal. There is no tenderness.  Musculoskeletal: Normal range of motion. He exhibits no edema.  Neurological: He is alert and oriented to person, place, and time.  Psychiatric: Mood, memory, affect and judgment normal.  Nursing note and vitals reviewed.    Recent Results (from the past 2160 hour(s))  POCT Glucose (CBG)     Status: Abnormal   Collection Time: 09/13/17 10:08 AM  Result Value Ref Range   POC Glucose 111 (A) 70 - 99 mg/dl  POCT HgB A1C     Status: Abnormal   Collection Time: 09/13/17 10:09 AM  Result Value Ref Range   Hemoglobin A1C 7.0      Assessment & Plan  1. Controlled type 2 diabetes mellitus with diabetic  neuropathy, without long-term current use of insulin (HCC)   A1c 7.0%, well-controlled diabetes - POCT HgB A1C - POCT Glucose (CBG) - pregabalin (LYRICA) 50 MG capsule; Take 1 capsule (50 mg total) 3 (three) times daily by mouth.  Dispense: 270 capsule; Refill: 0  2. Generalized anxiety disorder Symptoms of anxiety are stable on clonazepam taken up to twice daily as needed - clonazePAM (KLONOPIN) 1 MG tablet; Take 1 tablet (1 mg total) 2 (two) times daily as needed by mouth.  Dispense: 180 tablet; Refill: 0  3. Insomnia secondary to chronic pain  - clonazePAM (KLONOPIN) 0.5 MG tablet; Take 1 tablet (0.5 mg total) at bedtime as needed by mouth for anxiety.  Dispense: 90 tablet; Refill: 0  4. Essential hypertension Blood pressure is marginally elevated, likely secondary to anxiety, continue on antihypertensive treatment  5. Hypertriglyceridemia Marginally elevated, continue on Lovaza twice daily   Isma Tietje Asad A. [facility] 09/13/2017 10:12 AM

## 2017-09-19 LAB — TOXASSURE SELECT 13 (MW), URINE

## 2017-09-27 ENCOUNTER — Encounter: Payer: Self-pay | Admitting: Nurse Practitioner

## 2017-11-08 NOTE — Telephone Encounter (Signed)
Dup;icate

## 2017-12-10 ENCOUNTER — Telehealth: Payer: Self-pay | Admitting: Pain Medicine

## 2017-12-10 NOTE — Telephone Encounter (Signed)
correct 

## 2017-12-10 NOTE — Telephone Encounter (Signed)
Patient states he received letter canceling his medications, he does not want injections so there is no need for to come back to clinic at this time, per patient. No action needed. Just FYI

## 2017-12-11 NOTE — Telephone Encounter (Signed)
Appt resolved

## 2017-12-13 ENCOUNTER — Encounter: Payer: Self-pay | Admitting: Family Medicine

## 2017-12-13 ENCOUNTER — Ambulatory Visit (INDEPENDENT_AMBULATORY_CARE_PROVIDER_SITE_OTHER): Payer: Medicare Other | Admitting: Family Medicine

## 2017-12-13 ENCOUNTER — Ambulatory Visit: Payer: Medicare Other | Admitting: Nurse Practitioner

## 2017-12-13 DIAGNOSIS — E1140 Type 2 diabetes mellitus with diabetic neuropathy, unspecified: Secondary | ICD-10-CM

## 2017-12-13 DIAGNOSIS — I10 Essential (primary) hypertension: Secondary | ICD-10-CM

## 2017-12-13 DIAGNOSIS — E781 Pure hyperglyceridemia: Secondary | ICD-10-CM | POA: Diagnosis not present

## 2017-12-13 DIAGNOSIS — E785 Hyperlipidemia, unspecified: Secondary | ICD-10-CM | POA: Diagnosis not present

## 2017-12-13 DIAGNOSIS — K219 Gastro-esophageal reflux disease without esophagitis: Secondary | ICD-10-CM

## 2017-12-13 DIAGNOSIS — R600 Localized edema: Secondary | ICD-10-CM | POA: Diagnosis not present

## 2017-12-13 MED ORDER — SITAGLIPTIN PHOSPHATE 50 MG PO TABS: 50.0000 mg | ORAL_TABLET | Freq: Every day | ORAL | 1 refills | Status: DC

## 2017-12-13 MED ORDER — OMEGA-3-ACID ETHYL ESTERS 1 G PO CAPS: 2.0000 g | ORAL_CAPSULE | Freq: Two times a day (BID) | ORAL | 1 refills | Status: DC

## 2017-12-13 MED ORDER — PREGABALIN 50 MG PO CAPS: 50.0000 mg | ORAL_CAPSULE | Freq: Three times a day (TID) | ORAL | 0 refills | Status: DC

## 2017-12-13 MED ORDER — ATORVASTATIN CALCIUM 20 MG PO TABS: 20.0000 mg | ORAL_TABLET | Freq: Every day | ORAL | 1 refills | Status: DC

## 2017-12-13 MED ORDER — LISINOPRIL 2.5 MG PO TABS: 2.5000 mg | ORAL_TABLET | Freq: Every day | ORAL | 1 refills | Status: DC

## 2017-12-13 MED ORDER — METFORMIN HCL 1000 MG PO TABS: 1000.0000 mg | ORAL_TABLET | Freq: Two times a day (BID) | ORAL | 1 refills | Status: DC

## 2017-12-13 MED ORDER — FUROSEMIDE 40 MG PO TABS: 40.0000 mg | ORAL_TABLET | Freq: Every day | ORAL | 0 refills | Status: DC

## 2017-12-13 MED ORDER — ESOMEPRAZOLE MAGNESIUM 40 MG PO CPDR: 40.0000 mg | DELAYED_RELEASE_CAPSULE | Freq: Every day | ORAL | 1 refills | Status: DC

## 2017-12-13 MED ORDER — ATENOLOL 100 MG PO TABS: 100.0000 mg | ORAL_TABLET | Freq: Every day | ORAL | 1 refills | Status: DC

## 2017-12-13 NOTE — Progress Notes (Signed)
Name: Joel Bryant   MRN: [id number]    DOB: 1961-04-07   Date:12/13/2017       Progress Note  Subjective  Chief Complaint  Chief Complaint  Patient presents with  . Follow-up    3 months  . Diabetes    a day early to check blood  . Hyperlipidemia  . Hypertension  . Medication Refill    Diabetes  He presents for his follow-up diabetic visit. He has type 2 diabetes mellitus. His disease course has been stable. Pertinent negatives for hypoglycemia include no dizziness, headaches or sweats. Associated symptoms include foot paresthesias (occasional numbness in toes.). Pertinent negatives for diabetes include no blurred vision, no chest pain, no fatigue, no polydipsia and no polyuria. Pertinent negatives for diabetic complications include no CVA, heart disease or peripheral neuropathy. Current diabetic treatment includes oral agent (dual therapy). His weight is stable. He is following a generally healthy diet. He rarely participates in exercise. He monitors blood glucose at home 1-2 x per week. His breakfast blood glucose range is generally 110-130 mg/dl. An ACE inhibitor/angiotensin II receptor blocker is being taken. Eye exam is current.  Hyperlipidemia  This is a chronic problem. The problem is controlled. Recent lipid tests were reviewed and are high. Exacerbating diseases include diabetes and obesity. Pertinent negatives include no chest pain, leg pain or myalgias. Current antihyperlipidemic treatment includes statins. Risk factors for coronary artery disease include diabetes mellitus and dyslipidemia.  Hypertension  This is a chronic problem. The problem is unchanged. The problem is controlled. Pertinent negatives include no blurred vision, chest pain, headaches, palpitations or sweats. Past treatments include beta blockers, ACE inhibitors and diuretics. There is no history of kidney disease, CAD/MI or CVA.  Gastroesophageal Reflux  He reports no belching, no chest pain, no choking, no  heartburn or no nausea. This is a chronic problem. Pertinent negatives include no fatigue.     Past Medical History:  Diagnosis Date  . Anxiety   . Chronic LBP 09/13/2015  . Chronic left hip pain 09/27/2015  . Diabetes mellitus without complication ([place])   . GERD (gastroesophageal reflux disease)   . Hyperlipidemia   . Hyperlipidemia   . Hypertension   . Hypomagnesemia     Past Surgical History:  Procedure Laterality Date  . CATARACT EXTRACTION  1992   Insert Prosthetic Lens  . COLONOSCOPY WITH PROPOFOL N/A 02/28/2017   Procedure: COLONOSCOPY WITH PROPOFOL;  Surgeon: Manya Silvas, MD;  Location: [facility] ENDOSCOPY;  Service: Endoscopy;  Laterality: N/A;  . EYE SURGERY    . LAMINECTOMY  2001   lumbar  . MULTIPLE TOOTH EXTRACTIONS     2018  . TIBIA FRACTURE SURGERY    . TONSILLECTOMY    . WRIST SURGERY Right    x2    Family History  Problem Relation Age of Onset  . Colon polyps Mother   . Retinitis pigmentosa Mother   . Retinitis pigmentosa Maternal Grandmother   . Cancer Maternal Grandfather   . Diabetes Paternal Grandmother   . Alcohol abuse Father     Social History   Socioeconomic History  . Marital status: Divorced    Spouse name: Not on file  . Number of children: Not on file  . Years of education: Not on file  . Highest education level: Not on file  Social Needs  . Financial resource strain: Not on file  . Food insecurity - worry: Not on file  . Food insecurity - inability: Not on file  .  Transportation needs - medical: Not on file  . Transportation needs - non-medical: Not on file  Occupational History  . Not on file  Tobacco Use  . Smoking status: Former Smoker    Types: Cigarettes  . Smokeless tobacco: Never Used  . Tobacco comment: Quit smoking 4 years now  Substance and Sexual Activity  . Alcohol use: Yes    Alcohol/week: 0.0 oz    Comment: occassional/seldom  . Drug use: No    Comment: in past used marijuana   . Sexual activity: Not on  file  Other Topics Concern  . Not on file  Social History Narrative  . Not on file     Current Outpatient Medications:  .  aspirin 81 MG tablet, Take 81 mg by mouth daily., Disp: , Rfl:  .  atenolol (TENORMIN) 100 MG tablet, Take 1 tablet (100 mg total) by mouth daily., Disp: 90 tablet, Rfl: 1 .  atorvastatin (LIPITOR) 20 MG tablet, Take 1 tablet (20 mg total) by mouth daily at 6 PM., Disp: 90 tablet, Rfl: 1 .  diphenhydramine-acetaminophen (TYLENOL PM) 25-500 MG TABS tablet, Take 1 tablet by mouth at bedtime as needed., Disp: , Rfl:  .  esomeprazole (NEXIUM) 40 MG capsule, Take 1 capsule (40 mg total) by mouth daily., Disp: 90 capsule, Rfl: 1 .  furosemide (LASIX) 40 MG tablet, TAKE 1 TABLET BY MOUTH ONCE DAILY, Disp: 90 tablet, Rfl: 0 .  ibuprofen (ADVIL,MOTRIN) 800 MG tablet, Take 800 mg by mouth every 8 (eight) hours as needed., Disp: , Rfl:  .  lisinopril (PRINIVIL,ZESTRIL) 2.5 MG tablet, Take 1 tablet (2.5 mg total) by mouth daily., Disp: 90 tablet, Rfl: 1 .  metFORMIN (GLUCOPHAGE) 1000 MG tablet, Take 1 tablet (1,000 mg total) by mouth 2 (two) times daily., Disp: 180 tablet, Rfl: 1 .  Multiple Vitamins-Minerals (SENTRY SENIOR PO), Take 1 tablet by mouth., Disp: , Rfl:  .  omega-3 acid ethyl esters (LOVAZA) 1 g capsule, Take 2 capsules (2 g total) by mouth 2 (two) times daily., Disp: 360 capsule, Rfl: 1 .  pregabalin (LYRICA) 50 MG capsule, Take 1 capsule (50 mg total) 3 (three) times daily by mouth., Disp: 270 capsule, Rfl: 0 .  sitaGLIPtin (JANUVIA) 50 MG tablet, Take 1 tablet (50 mg total) by mouth daily., Disp: 90 tablet, Rfl: 1 .  ACCU-CHEK SOFTCLIX LANCETS lancets, USE AS DIRECTED TO CHECK BLOOD SUGAR TWICE A DAY, Disp: 100 each, Rfl: 2 .  Biotin 5000 MCG CAPS, Take 5,000 capsules by mouth 2 (two) times daily., Disp: , Rfl:  .  Cholecalciferol (VITAMIN D3) 2000 units TABS, Take 2,000 tablets by mouth daily., Disp: , Rfl:  .  morphine (MS CONTIN) 15 MG 12 hr tablet, Take 1 tablet  (15 mg total) every 12 (twelve) hours by mouth. (Patient not taking: Reported on 12/13/2017), Disp: 60 tablet, Rfl: 0 .  morphine (MS CONTIN) 15 MG 12 hr tablet, Take 1 tablet (15 mg total) every 12 (twelve) hours by mouth., Disp: 60 tablet, Rfl: 0 .  morphine (MS CONTIN) 15 MG 12 hr tablet, Take 1 tablet (15 mg total) every 12 (twelve) hours by mouth., Disp: 60 tablet, Rfl: 0  No Known Allergies   Review of Systems  Constitutional: Negative for fatigue.  Eyes: Negative for blurred vision.  Respiratory: Negative for choking.   Cardiovascular: Negative for chest pain and palpitations.  Gastrointestinal: Negative for heartburn and nausea.  Musculoskeletal: Negative for myalgias.  Neurological: Negative for dizziness and headaches.  Endo/Heme/Allergies: Negative for polydipsia.     Objective  Vitals:   12/13/17 0958  BP: 124/78  Pulse: 62  Resp: 14  Temp: 98.2 F (36.8 C)  TempSrc: Oral  SpO2: 96%  Weight: 238 lb 12.8 oz (108.3 kg)    Physical Exam  Constitutional: He is oriented to person, place, and time and well-developed, well-nourished, and in no distress.  HENT:  Head: Normocephalic and atraumatic.  Cardiovascular: Normal rate, regular rhythm and normal heart sounds.  No murmur heard. Pulmonary/Chest: Effort normal and breath sounds normal. He has no wheezes.  Abdominal: Soft. Bowel sounds are normal.  Musculoskeletal: He exhibits edema and tenderness.  Neurological: He is alert and oriented to person, place, and time.  Psychiatric: Mood, memory, affect and judgment normal.  Nursing note and vitals reviewed.     Assessment & Plan  1. Bilateral edema of lower extremity Ration takes furosemide as needed for swelling of lower extremity - furosemide (LASIX) 40 MG tablet; Take 1 tablet (40 mg total) by mouth daily.  Dispense: 90 tablet; Refill: 0  2. Controlled type 2 diabetes mellitus with diabetic neuropathy, without long-term current use of insulin (HCC) Obtain  hemoglobin A1c and urine microalbumin/creatinine ratio, refills for medications provided - lisinopril (PRINIVIL,ZESTRIL) 2.5 MG tablet; Take 1 tablet (2.5 mg total) by mouth daily.  Dispense: 90 tablet; Refill: 1 - metFORMIN (GLUCOPHAGE) 1000 MG tablet; Take 1 tablet (1,000 mg total) by mouth 2 (two) times daily.  Dispense: 180 tablet; Refill: 1 - pregabalin (LYRICA) 50 MG capsule; Take 1 capsule (50 mg total) by mouth 3 (three) times daily.  Dispense: 270 capsule; Refill: 0 - sitaGLIPtin (JANUVIA) 50 MG tablet; Take 1 tablet (50 mg total) by mouth daily.  Dispense: 90 tablet; Refill: 1 - Urine Microalbumin w/creat. ratio - HgB A1c  3. Dyslipidemia  - atorvastatin (LIPITOR) 20 MG tablet; Take 1 tablet (20 mg total) by mouth daily at 6 PM.  Dispense: 90 tablet; Refill: 1 - Lipid panel  4. Essential hypertension  - atenolol (TENORMIN) 100 MG tablet; Take 1 tablet (100 mg total) by mouth daily.  Dispense: 90 tablet; Refill: 1  5. Gastroesophageal reflux disease, esophagitis presence not specified  - esomeprazole (NEXIUM) 40 MG capsule; Take 1 capsule (40 mg total) by mouth daily.  Dispense: 90 capsule; Refill: 1  6. Hypertriglyceridemia  - omega-3 acid ethyl esters (LOVAZA) 1 g capsule; Take 2 capsules (2 g total) by mouth 2 (two) times daily.  Dispense: 360 capsule; Refill: 1 - Lipid panel - COMPLETE METABOLIC PANEL WITH GFR   Blossom Crume [facility] 12/13/2017 10:16 AM

## 2018-01-02 ENCOUNTER — Telehealth: Payer: Self-pay | Admitting: Family Medicine

## 2018-01-02 NOTE — Telephone Encounter (Signed)
Copied from [place]. Topic: Quick Communication - See Telephone Encounter >> Jan 02, 2018  3:16 PM Lolita Rieger, RMA wrote: CRM for notification. See Telephone encounter for:   01/02/18.pt would like see [facility] instead of [facility] on 03/12/18 due to the fact that his entire family sees [facility] Pt #[phone]

## 2018-02-28 ENCOUNTER — Other Ambulatory Visit: Payer: Self-pay

## 2018-02-28 DIAGNOSIS — E785 Hyperlipidemia, unspecified: Secondary | ICD-10-CM

## 2018-02-28 DIAGNOSIS — E781 Pure hyperglyceridemia: Secondary | ICD-10-CM

## 2018-02-28 DIAGNOSIS — E1140 Type 2 diabetes mellitus with diabetic neuropathy, unspecified: Secondary | ICD-10-CM

## 2018-02-28 DIAGNOSIS — R600 Localized edema: Secondary | ICD-10-CM

## 2018-02-28 DIAGNOSIS — K219 Gastro-esophageal reflux disease without esophagitis: Secondary | ICD-10-CM

## 2018-02-28 DIAGNOSIS — I10 Essential (primary) hypertension: Secondary | ICD-10-CM

## 2018-02-28 NOTE — Telephone Encounter (Signed)
Has a f/u appt 5/14

## 2018-03-01 NOTE — Telephone Encounter (Signed)
He has follow up on the 14th with Dr. Sanda Klein. Does he have enough to last until the visit?

## 2018-03-01 NOTE — Telephone Encounter (Signed)
Should have enough to last until appt

## 2018-03-12 ENCOUNTER — Ambulatory Visit (INDEPENDENT_AMBULATORY_CARE_PROVIDER_SITE_OTHER): Payer: Medicare Other | Admitting: Family Medicine

## 2018-03-12 ENCOUNTER — Encounter: Payer: Self-pay | Admitting: Family Medicine

## 2018-03-12 VITALS — BP 134/86 | HR 64 | Temp 98.6°F | Resp 14 | Ht 69.0 in | Wt 224.4 lb

## 2018-03-12 DIAGNOSIS — I10 Essential (primary) hypertension: Secondary | ICD-10-CM | POA: Diagnosis not present

## 2018-03-12 DIAGNOSIS — E1140 Type 2 diabetes mellitus with diabetic neuropathy, unspecified: Secondary | ICD-10-CM | POA: Diagnosis not present

## 2018-03-12 DIAGNOSIS — Z5181 Encounter for therapeutic drug level monitoring: Secondary | ICD-10-CM

## 2018-03-12 DIAGNOSIS — M1A49X0 Other secondary chronic gout, multiple sites, without tophus (tophi): Secondary | ICD-10-CM

## 2018-03-12 DIAGNOSIS — E781 Pure hyperglyceridemia: Secondary | ICD-10-CM | POA: Diagnosis not present

## 2018-03-12 DIAGNOSIS — R634 Abnormal weight loss: Secondary | ICD-10-CM

## 2018-03-12 DIAGNOSIS — F418 Other specified anxiety disorders: Secondary | ICD-10-CM

## 2018-03-12 DIAGNOSIS — E785 Hyperlipidemia, unspecified: Secondary | ICD-10-CM | POA: Diagnosis not present

## 2018-03-12 DIAGNOSIS — M47816 Spondylosis without myelopathy or radiculopathy, lumbar region: Secondary | ICD-10-CM

## 2018-03-12 DIAGNOSIS — E1165 Type 2 diabetes mellitus with hyperglycemia: Secondary | ICD-10-CM | POA: Diagnosis not present

## 2018-03-12 DIAGNOSIS — G4700 Insomnia, unspecified: Secondary | ICD-10-CM

## 2018-03-12 NOTE — Patient Instructions (Addendum)
Check out the information at familydoctor.org entitled "Nutrition for Weight Loss: What You Need to Know about Fad Diets" Try to lose between 1-2 pounds per week by taking in fewer calories and burning off more calories You can succeed by limiting portions, limiting foods dense in calories and fat, becoming more active, and drinking 8 glasses of water a day (64 ounces) Don't skip meals, especially breakfast, as skipping meals may alter your metabolism Do not use over-the-counter weight loss pills or gimmicks that claim rapid weight loss A healthy BMI (or body mass index) is between 18.5 and 24.9 You can calculate your ideal BMI at the NIH website ClubMonetize.fr  Try to limit saturated fats in your diet (bologna, hot dogs, barbeque, cheeseburgers, hamburgers, steak, bacon, sausage, cheese, etc.) and get more fresh fruits, vegetables, and whole grains  Please do see your eye doctor regularly, and have your eyes examined every year (or more often per his or her recommendation) Check your feet every night and let me know right away of any sores, infections, numbness, etc. Try to limit sweets, white bread, white rice, white potatoes It is okay with me for you to not check your fingerstick blood sugars (per [employer] of Endocrinology Best Practices), unless you are interested and feel it would be helpful for you  If you need something for aches or pains, try to use Tylenol (acetaminophen) instead of non-steroidals (which include Aleve, ibuprofen, Advil, Motrin, and naproxen); non-steroidals can cause long-term kidney damage  We'll have you see the psychiatrist about your anxiety and your sleep  Insomnia Insomnia is a sleep disorder that makes it difficult to fall asleep or to stay asleep. Insomnia can cause tiredness (fatigue), low energy, difficulty concentrating, mood swings, and poor performance at work or school. There are three different  ways to classify insomnia:  Difficulty falling asleep.  Difficulty staying asleep.  Waking up too early in the morning.  Any type of insomnia can be long-term (chronic) or short-term (acute). Both are common. Short-term insomnia usually lasts for three months or less. Chronic insomnia occurs at least three times a week for longer than three months. What are the causes? Insomnia may be caused by another condition, situation, or substance, such as:  Anxiety.  Certain medicines.  Gastroesophageal reflux disease (GERD) or other gastrointestinal conditions.  Asthma or other breathing conditions.  Restless legs syndrome, sleep apnea, or other sleep disorders.  Chronic pain.  Menopause. This may include hot flashes.  Stroke.  Abuse of alcohol, tobacco, or illegal drugs.  Depression.  Caffeine.  Neurological disorders, such as Alzheimer disease.  An overactive thyroid (hyperthyroidism).  The cause of insomnia may not be known. What increases the risk? Risk factors for insomnia include:  Gender. Women are more commonly affected than men.  Age. Insomnia is more common as you get older.  Stress. This may involve your professional or personal life.  Income. Insomnia is more common in people with lower income.  Lack of exercise.  Irregular work schedule or night shifts.  Traveling between different time zones.  What are the signs or symptoms? If you have insomnia, trouble falling asleep or trouble staying asleep is the main symptom. This may lead to other symptoms, such as:  Feeling fatigued.  Feeling nervous about going to sleep.  Not feeling rested in the morning.  Having trouble concentrating.  Feeling irritable, anxious, or depressed.  How is this treated? Treatment for insomnia depends on the cause. If your insomnia is caused by an  underlying condition, treatment will focus on addressing the condition. Treatment may also include:  Medicines to help you  sleep.  Counseling or therapy.  Lifestyle adjustments.  Follow these instructions at home:  Take medicines only as directed by your health care provider.  Keep regular sleeping and waking hours. Avoid naps.  Keep a sleep diary to help you and your health care provider figure out what could be causing your insomnia. Include: ? When you sleep. ? When you wake up during the night. ? How well you sleep. ? How rested you feel the next day. ? Any side effects of medicines you are taking. ? What you eat and drink.  Make your bedroom a comfortable place where it is easy to fall asleep: ? Put up shades or special blackout curtains to block light from outside. ? Use a white noise machine to block noise. ? Keep the temperature cool.  Exercise regularly as directed by your health care provider. Avoid exercising right before bedtime.  Use relaxation techniques to manage stress. Ask your health care provider to suggest some techniques that may work well for you. These may include: ? Breathing exercises. ? Routines to release muscle tension. ? Visualizing peaceful scenes.  Cut back on alcohol, caffeinated beverages, and cigarettes, especially close to bedtime. These can disrupt your sleep.  Do not overeat or eat spicy foods right before bedtime. This can lead to digestive discomfort that can make it hard for you to sleep.  Limit screen use before bedtime. This includes: ? Watching TV. ? Using your smartphone, tablet, and computer.  Stick to a routine. This can help you fall asleep faster. Try to do a quiet activity, brush your teeth, and go to bed at the same time each night.  Get out of bed if you are still awake after 15 minutes of trying to sleep. Keep the lights down, but try reading or doing a quiet activity. When you feel sleepy, go back to bed.  Make sure that you drive carefully. Avoid driving if you feel very sleepy.  Keep all follow-up appointments as directed by your health  care provider. This is important. Contact a health care provider if:  You are tired throughout the day or have trouble in your daily routine due to sleepiness.  You continue to have sleep problems or your sleep problems get worse. Get help right away if:  You have serious thoughts about hurting yourself or someone else. This information is not intended to replace advice given to you by your health care provider. Make sure you discuss any questions you have with your health care provider. Document Released: 10/13/2000 Document Revised: 03/17/2016 Document Reviewed: 07/17/2014 Elsevier Interactive Patient Education  [employer].

## 2018-03-12 NOTE — Progress Notes (Signed)
BP 134/86   Pulse 64   Temp 98.6 F (37 C) (Oral)   Resp 14   Ht 5\' 9"  (1.753 m)   Wt 224 lb 6.4 oz (101.8 kg)   SpO2 94%   BMI 33.14 kg/m    Subjective:    Patient ID: Joel Bryant, male    DOB: [birth year], 57 y.o.   MRN: [id number]  HPI: Joel Bryant is a 57 y.o. male  Chief Complaint  Patient presents with  . Follow-up    Former [facility] pt  . Medication Refill  . Insomnia    HPI Patient is new to me; previous provider left our practice  Type 2 diabetes mellitus; on metformin and Januvia; ACE-I for renal protection; stubbed his toe, cannot see well, retinitis pigmentosa; was told by Dr. Brunetta Genera that he had diabetes; checks sugars once in a while Lab Results  Component Value Date   HGBA1C 5.9 (H) 03/12/2018   Hypertension; on low dose ACE-I  High cholesterol; on Lovaza and atorvastatin; limiting saturated fats  Obesity; losing weight, cutting back on sodas and sweets; more vegetables; looser stools, more fruits and vegetables  Chronic pain; used to take pain medicine prescribed by NP Dionisio David; was cut off of meds  Insomnia; he can't sleep, tried a friend's Seroquel and it worked great; "I know it ain't right"; he slept so good; he would be open to seeing someone; trouble falling asleep and staying asleep; has the TV on, sleeps better with it on; will lay there for 3 hours; hardly slept last night; was drinking a 2 liter Coke every day and stopped Good Friday; lost 20 pounds, now stabiklized  Depression screen [facility] 2/9 03/12/2018 12/13/2017 09/13/2017 09/13/2017 06/14/2017  Decreased Interest 0 0 0 0 0  Down, Depressed, Hopeless 0 0 0 0 0  PHQ - 2 Score 0 0 0 0 0    Relevant past medical, surgical, family and social history reviewed Past Medical History:  Diagnosis Date  . Anxiety   . Chronic LBP 09/13/2015  . Chronic left hip pain 09/27/2015  . Diabetes mellitus without complication ([place])   . GERD (gastroesophageal reflux disease)   . Hyperlipidemia     . Hyperlipidemia   . Hypertension   . Hypomagnesemia    Past Surgical History:  Procedure Laterality Date  . CATARACT EXTRACTION  1992   Insert Prosthetic Lens  . COLONOSCOPY WITH PROPOFOL N/A 02/28/2017   Procedure: COLONOSCOPY WITH PROPOFOL;  Surgeon: Manya Silvas, MD;  Location: [facility] ENDOSCOPY;  Service: Endoscopy;  Laterality: N/A;  . EYE SURGERY    . LAMINECTOMY  2001   lumbar  . MULTIPLE TOOTH EXTRACTIONS     2018  . TIBIA FRACTURE SURGERY    . TONSILLECTOMY    . WRIST SURGERY Right    x2   Family History  Problem Relation Age of Onset  . Colon polyps Mother   . Retinitis pigmentosa Mother   . Retinitis pigmentosa Maternal Grandmother   . Cancer Maternal Grandfather   . Diabetes Paternal Grandmother   . Alcohol abuse Father    Social History   Tobacco Use  . Smoking status: Former Smoker    Types: Cigarettes  . Smokeless tobacco: Never Used  . Tobacco comment: Quit smoking 4 years now  Substance Use Topics  . Alcohol use: Not Currently    Alcohol/week: 0.0 oz    Comment: occassional/seldom  . Drug use: No    Comment: in past used marijuana  Interim medical history since last visit reviewed. Allergies and medications reviewed  Review of Systems Per HPI unless specifically indicated above     Objective:    BP 134/86   Pulse 64   Temp 98.6 F (37 C) (Oral)   Resp 14   Ht 5\' 9"  (1.753 m)   Wt 224 lb 6.4 oz (101.8 kg)   SpO2 94%   BMI 33.14 kg/m   Wt Readings from Last 3 Encounters:  03/12/18 224 lb 6.4 oz (101.8 kg)  12/13/17 238 lb 12.8 oz (108.3 kg)  09/13/17 238 lb 12.8 oz (108.3 kg)    Physical Exam  Constitutional: He appears well-developed and well-nourished. No distress.  HENT:  Head: Normocephalic and atraumatic.  Eyes: EOM are normal. No scleral icterus.  Neck: No thyromegaly present.  Cardiovascular: Normal rate and regular rhythm.  Pulmonary/Chest: Effort normal and breath sounds normal.  Abdominal: Soft. Bowel sounds are  normal. He exhibits no distension.  Musculoskeletal: He exhibits no edema.  Neurological: Coordination normal.  Skin: Skin is warm and dry. No pallor.  Psychiatric: He has a normal mood and affect. His behavior is normal. Judgment and thought content normal.       Assessment & Plan:   Problem List Items Addressed This Visit      Cardiovascular and Mediastinum   BP (high blood pressure)     Endocrine   Diabetes mellitus, type 2 (HCC)   Relevant Orders   Microalbumin / creatinine urine ratio (Completed)   Hemoglobin A1c (Completed)   Controlled type 2 diabetes mellitus with diabetic neuropathy, without long-term current use of insulin (HCC) - Primary     Musculoskeletal and Integument   Lumbar facet hypertrophy (Bilateral, Multilevel) (Chronic)     Other   Hypertriglyceridemia   Relevant Orders   Lipid panel (Completed)   Gout   Relevant Orders   Uric acid (Completed)   Dyslipidemia   Anxiety disorder   Relevant Orders   Ambulatory referral to Psychiatry    Other Visit Diagnoses    Weight loss       Relevant Orders   TSH (Completed)   Insomnia, unspecified type       Relevant Orders   Ambulatory referral to Psychiatry   Medication monitoring encounter       Relevant Orders   COMPLETE METABOLIC PANEL WITH GFR (Completed)       Follow up plan: Return in about 3 months (around 06/12/2018) for follow-up visit with Dr. Sanda Klein.  An after-visit summary was printed and given to the patient at [place].  Please see the patient instructions which may contain other information and recommendations beyond what is mentioned above in the assessment and plan.  No orders of the defined types were placed in this encounter.   Orders Placed This Encounter  Procedures  . TSH  . Microalbumin / creatinine urine ratio  . Lipid panel  . Hemoglobin A1c  . COMPLETE METABOLIC PANEL WITH GFR  . Uric acid  . Ambulatory referral to Psychiatry

## 2018-03-20 ENCOUNTER — Telehealth: Payer: Self-pay

## 2018-03-20 DIAGNOSIS — R7989 Other specified abnormal findings of blood chemistry: Secondary | ICD-10-CM

## 2018-03-20 NOTE — Telephone Encounter (Signed)
-----   Message from Arnetha Courser, MD sent at 03/19/2018  7:30 PM EDT ----- Roselyn Reef, please let pt know that his thyroid test is a little abnormal, so I'd like to recheck that in 6 weeks (please ORDER TSH and free T4, diagnosis abnormal thyroid test) He is not spilling protein through his kidneys Cholesterol panel is very good; try to lose a little weight and eat better, and he can get his LDL down under 70 on his own while staying on the 20 mg atorvastatin The A1c shows very good control of his diabetes; we can see him again 6 months after last visit Uric acid is a little above expected, so I'll encourage diet low in purines (mail hand-out if requested) and tart cherry juice; thank you

## 2018-03-21 LAB — COMPLETE METABOLIC PANEL WITH GFR
AG Ratio: 2 (calc) (ref 1.0–2.5)
ALT: 17 U/L (ref 9–46)
AST: 24 U/L (ref 10–35)
Albumin: 5 g/dL (ref 3.6–5.1)
Alkaline phosphatase (APISO): 67 U/L (ref 40–115)
BUN: 11 mg/dL (ref 7–25)
CO2: 30 mmol/L (ref 20–32)
Calcium: 10 mg/dL (ref 8.6–10.3)
Chloride: 97 mmol/L — ABNORMAL LOW (ref 98–110)
Creat: 0.86 mg/dL (ref 0.70–1.33)
GFR, Est African American: 112 mL/min/{1.73_m2} (ref >=60)
GFR, Est Non African American: 96 mL/min/{1.73_m2} (ref >=60)
Globulin: 2.5 g/dL (calc) (ref 1.9–3.7)
Glucose, Bld: 134 mg/dL — ABNORMAL HIGH (ref 65–99)
Potassium: 4.3 mmol/L (ref 3.5–5.3)
Sodium: 137 mmol/L (ref 135–146)
Total Bilirubin: 0.9 mg/dL (ref 0.2–1.2)
Total Protein: 7.5 g/dL (ref 6.1–8.1)

## 2018-03-21 LAB — LIPID PANEL
Cholesterol: 146 mg/dL (ref <200)
HDL: 46 mg/dL (ref >40)
LDL Cholesterol (Calc): 75 mg/dL (calc)
Non-HDL Cholesterol (Calc): 100 mg/dL (calc) (ref <130)
Total CHOL/HDL Ratio: 3.2 (calc) (ref <5.0)
Triglycerides: 148 mg/dL (ref <150)

## 2018-03-21 LAB — TSH: TSH: 0.32 mIU/L — ABNORMAL LOW (ref 0.40–4.50)

## 2018-03-21 LAB — HEMOGLOBIN A1C
Hgb A1c MFr Bld: 5.9 % of total Hgb — ABNORMAL HIGH (ref <5.7)
Mean Plasma Glucose: 123 (calc)
eAG (mmol/L): 6.8 (calc)

## 2018-03-21 LAB — MICROALBUMIN / CREATININE URINE RATIO
Creatinine, Urine: 278 mg/dL (ref 20–320)
Microalb Creat Ratio: 7 mcg/mg creat (ref <30)
Microalb, Ur: 1.9 mg/dL

## 2018-03-21 LAB — URIC ACID: Uric Acid, Serum: 7.4 mg/dL (ref 4.0–8.0)

## 2018-04-11 ENCOUNTER — Ambulatory Visit: Payer: Medicare Other | Admitting: Psychiatry

## 2018-05-22 ENCOUNTER — Emergency Department
Admission: EM | Admit: 2018-05-22 | Discharge: 2018-05-22 | Disposition: A | Discharge status: Home / Self Care | Payer: Medicare Other | Attending: Emergency Medicine | Admitting: Emergency Medicine

## 2018-05-22 ENCOUNTER — Emergency Department: Payer: Medicare Other

## 2018-05-22 ENCOUNTER — Encounter: Payer: Self-pay | Admitting: Emergency Medicine

## 2018-05-22 DIAGNOSIS — M4850XA Collapsed vertebra, not elsewhere classified, site unspecified, initial encounter for fracture: Secondary | ICD-10-CM

## 2018-05-22 DIAGNOSIS — R109 Unspecified abdominal pain: Secondary | ICD-10-CM | POA: Insufficient documentation

## 2018-05-22 DIAGNOSIS — S20212A Contusion of left front wall of thorax, initial encounter: Secondary | ICD-10-CM

## 2018-05-22 DIAGNOSIS — S32000A Wedge compression fracture of unspecified lumbar vertebra, initial encounter for closed fracture: Secondary | ICD-10-CM | POA: Insufficient documentation

## 2018-05-22 DIAGNOSIS — Y939 Activity, unspecified: Secondary | ICD-10-CM | POA: Diagnosis not present

## 2018-05-22 DIAGNOSIS — W19XXXA Unspecified fall, initial encounter: Secondary | ICD-10-CM | POA: Insufficient documentation

## 2018-05-22 DIAGNOSIS — S0990XA Unspecified injury of head, initial encounter: Secondary | ICD-10-CM

## 2018-05-22 DIAGNOSIS — Z79899 Other long term (current) drug therapy: Secondary | ICD-10-CM | POA: Diagnosis not present

## 2018-05-22 DIAGNOSIS — Y929 Unspecified place or not applicable: Secondary | ICD-10-CM | POA: Diagnosis not present

## 2018-05-22 DIAGNOSIS — R197 Diarrhea, unspecified: Secondary | ICD-10-CM

## 2018-05-22 DIAGNOSIS — Z87891 Personal history of nicotine dependence: Secondary | ICD-10-CM | POA: Insufficient documentation

## 2018-05-22 DIAGNOSIS — R55 Syncope and collapse: Secondary | ICD-10-CM | POA: Insufficient documentation

## 2018-05-22 DIAGNOSIS — E119 Type 2 diabetes mellitus without complications: Secondary | ICD-10-CM | POA: Insufficient documentation

## 2018-05-22 DIAGNOSIS — I10 Essential (primary) hypertension: Secondary | ICD-10-CM | POA: Diagnosis not present

## 2018-05-22 DIAGNOSIS — Z7982 Long term (current) use of aspirin: Secondary | ICD-10-CM | POA: Diagnosis not present

## 2018-05-22 DIAGNOSIS — Y999 Unspecified external cause status: Secondary | ICD-10-CM | POA: Diagnosis not present

## 2018-05-22 LAB — CBC WITH DIFFERENTIAL/PLATELET
Basophils Absolute: 0.1 10*3/uL (ref 0–0.1)
Basophils Relative: 1 %
Eosinophils Absolute: 0.1 10*3/uL (ref 0–0.7)
Eosinophils Relative: 1 %
HCT: 42.5 % (ref 40.0–52.0)
Hemoglobin: 15.6 g/dL (ref 13.0–18.0)
Lymphocytes Relative: 17 %
Lymphs Abs: 1.7 10*3/uL (ref 1.0–3.6)
MCH: 32.2 pg (ref 26.0–34.0)
MCHC: 36.6 g/dL — ABNORMAL HIGH (ref 32.0–36.0)
MCV: 88 fL (ref 80.0–100.0)
Monocytes Absolute: 0.8 10*3/uL (ref 0.2–1.0)
Monocytes Relative: 8 %
Neutro Abs: 7.2 10*3/uL — ABNORMAL HIGH (ref 1.4–6.5)
Neutrophils Relative %: 73 %
Platelets: 231 10*3/uL (ref 150–440)
RBC: 4.84 MIL/uL (ref 4.40–5.90)
RDW: 14.3 % (ref 11.5–14.5)
WBC: 9.9 10*3/uL (ref 3.8–10.6)

## 2018-05-22 LAB — COMPREHENSIVE METABOLIC PANEL
ALT: 23 U/L (ref 0–44)
AST: 37 U/L (ref 15–41)
Albumin: 5 g/dL (ref 3.5–5.0)
Alkaline Phosphatase: 57 U/L (ref 38–126)
Anion gap: 9 (ref 5–15)
BUN: 13 mg/dL (ref 6–20)
CO2: 28 mmol/L (ref 22–32)
Calcium: 9.8 mg/dL (ref 8.9–10.3)
Chloride: 100 mmol/L (ref 98–111)
Creatinine, Ser: 0.89 mg/dL (ref 0.61–1.24)
GFR calc Af Amer: >60 mL/min (ref >60)
GFR calc non Af Amer: >60 mL/min (ref >60)
Glucose, Bld: 122 mg/dL — ABNORMAL HIGH (ref 70–99)
Potassium: 3.9 mmol/L (ref 3.5–5.1)
Sodium: 137 mmol/L (ref 135–145)
Total Bilirubin: 1.5 mg/dL — ABNORMAL HIGH (ref 0.3–1.2)
Total Protein: 8.3 g/dL — ABNORMAL HIGH (ref 6.5–8.1)

## 2018-05-22 LAB — PROTIME-INR
INR: 1.04
Prothrombin Time: 13.5 seconds (ref 11.4–15.2)

## 2018-05-22 LAB — URINALYSIS, COMPLETE (UACMP) WITH MICROSCOPIC
Bacteria, UA: NONE SEEN
Bilirubin Urine: NEGATIVE
Glucose, UA: NEGATIVE mg/dL
Hgb urine dipstick: NEGATIVE
Ketones, ur: NEGATIVE mg/dL
Leukocytes, UA: NEGATIVE
Nitrite: NEGATIVE
Protein, ur: NEGATIVE mg/dL
Specific Gravity, Urine: 1.006 (ref 1.005–1.030)
Squamous Epithelial / LPF: NONE SEEN (ref 0–5)
WBC, UA: NONE SEEN WBC/hpf (ref 0–5)
pH: 6 (ref 5.0–8.0)

## 2018-05-22 LAB — GLUCOSE, CAPILLARY: Glucose-Capillary: 129 mg/dL — ABNORMAL HIGH (ref 70–99)

## 2018-05-22 LAB — TROPONIN I: Troponin I: <0.03 ng/mL (ref <0.03)

## 2018-05-22 LAB — LIPASE, BLOOD: Lipase: 76 U/L — ABNORMAL HIGH (ref 11–51)

## 2018-05-22 MED ORDER — HYDROMORPHONE HCL 1 MG/ML IJ SOLN
0.5000 mg | Freq: Once | INTRAMUSCULAR | Status: AC
  Administered 2018-05-22: 0.5 mg via INTRAVENOUS
  Filled 2018-05-22: qty 1

## 2018-05-22 MED ORDER — SODIUM CHLORIDE 0.9 % IV BOLUS
500.0000 mL | Freq: Once | INTRAVENOUS | Status: AC
  Administered 2018-05-22: 500 mL via INTRAVENOUS

## 2018-05-22 MED ORDER — OXYCODONE-ACETAMINOPHEN 5-325 MG PO TABS: 1.0000 | ORAL_TABLET | ORAL | 0 refills | Status: DC | PRN

## 2018-05-22 MED ORDER — HYDROMORPHONE HCL 1 MG/ML IJ SOLN
1.0000 mg | Freq: Once | INTRAMUSCULAR | Status: AC
  Administered 2018-05-22: 1 mg via INTRAVENOUS
  Filled 2018-05-22: qty 1

## 2018-05-22 MED ORDER — IOPAMIDOL (ISOVUE-370) INJECTION 76%
100.0000 mL | Freq: Once | INTRAVENOUS | Status: AC | PRN
  Administered 2018-05-22: 100 mL via INTRAVENOUS
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
## 2018-05-22 NOTE — ED Notes (Signed)
First Nurse Note: Patient states he fell 2-3 times yesterday AM and thinks it's because his "sugar dropped".

## 2018-05-22 NOTE — Discharge Instructions (Signed)
Return to the emergency room for any new or worrisome symptoms, you would prefer not to be admitted to the hospital.  This is your choice but it does limit our ability to monitor you.  You have a very slight possible T12 endplate disturbance which could represent a fracture, please follow-up for that with primary care doctor in referral physician above.  Drink plenty of fluids, do not stand up quickly, return to the emergency room if you feel any worse in any way including numbness, weakness, worsening symptoms including cough or fever, rest pain or you feel worse in any way

## 2018-05-22 NOTE — ED Notes (Signed)
Patient verbalizes understanding of need to follow-up with PCP and Orthopedist.  Patient ambulatory with steady gait.  No obvious distress at this time.

## 2018-05-22 NOTE — ED Triage Notes (Signed)
Patient presents to ED via POV from home post fall. Patient states he has fallen over 3 times since yesterday. Patient denies LOC but does report hitting his head. Patient reports taking a daily asa. Patient states "When I stand up I just fall down. I think my sugar is low". Patient noted to have abrasion to nose.

## 2018-05-30 ENCOUNTER — Other Ambulatory Visit: Payer: Self-pay

## 2018-05-30 DIAGNOSIS — E1140 Type 2 diabetes mellitus with diabetic neuropathy, unspecified: Secondary | ICD-10-CM

## 2018-05-30 DIAGNOSIS — E781 Pure hyperglyceridemia: Secondary | ICD-10-CM

## 2018-05-30 DIAGNOSIS — E785 Hyperlipidemia, unspecified: Secondary | ICD-10-CM

## 2018-05-30 DIAGNOSIS — I10 Essential (primary) hypertension: Secondary | ICD-10-CM

## 2018-05-30 DIAGNOSIS — K219 Gastro-esophageal reflux disease without esophagitis: Secondary | ICD-10-CM

## 2018-05-30 MED ORDER — ATORVASTATIN CALCIUM 20 MG PO TABS: 20.0000 mg | ORAL_TABLET | Freq: Every day | ORAL | 0 refills | Status: DC

## 2018-05-30 MED ORDER — OMEGA-3-ACID ETHYL ESTERS 1 G PO CAPS: 2.0000 g | ORAL_CAPSULE | Freq: Two times a day (BID) | ORAL | 0 refills | Status: DC

## 2018-05-30 MED ORDER — METFORMIN HCL 1000 MG PO TABS: 1000.0000 mg | ORAL_TABLET | Freq: Two times a day (BID) | ORAL | 0 refills | Status: DC

## 2018-05-30 MED ORDER — ATENOLOL 100 MG PO TABS
100.0000 mg | ORAL_TABLET | Freq: Every day | ORAL | 0 refills | Status: DC
Start: 2018-05-30 — End: 2018-09-11

## 2018-05-30 MED ORDER — LISINOPRIL 2.5 MG PO TABS: 2.5000 mg | ORAL_TABLET | Freq: Every day | ORAL | 0 refills | Status: DC

## 2018-05-30 MED ORDER — ESOMEPRAZOLE MAGNESIUM 40 MG PO CPDR: 40.0000 mg | DELAYED_RELEASE_CAPSULE | Freq: Every day | ORAL | 0 refills | Status: DC

## 2018-05-30 MED ORDER — SITAGLIPTIN PHOSPHATE 50 MG PO TABS: 50.0000 mg | ORAL_TABLET | Freq: Every day | ORAL | 0 refills | Status: DC

## 2018-05-31 ENCOUNTER — Other Ambulatory Visit: Payer: Self-pay | Admitting: Nurse Practitioner

## 2018-05-31 DIAGNOSIS — E1140 Type 2 diabetes mellitus with diabetic neuropathy, unspecified: Secondary | ICD-10-CM

## 2018-05-31 MED ORDER — SITAGLIPTIN PHOSPHATE 50 MG PO TABS: 50.0000 mg | ORAL_TABLET | Freq: Every day | ORAL | 0 refills | Status: DC

## 2018-06-13 ENCOUNTER — Ambulatory Visit (INDEPENDENT_AMBULATORY_CARE_PROVIDER_SITE_OTHER): Payer: Medicare Other | Admitting: Family Medicine

## 2018-06-13 ENCOUNTER — Encounter: Payer: Self-pay | Admitting: Family Medicine

## 2018-06-13 VITALS — BP 124/76 | HR 98 | Temp 98.3°F | Ht 69.0 in | Wt 225.5 lb

## 2018-06-13 DIAGNOSIS — R748 Abnormal levels of other serum enzymes: Secondary | ICD-10-CM

## 2018-06-13 DIAGNOSIS — E785 Hyperlipidemia, unspecified: Secondary | ICD-10-CM

## 2018-06-13 DIAGNOSIS — I10 Essential (primary) hypertension: Secondary | ICD-10-CM | POA: Diagnosis not present

## 2018-06-13 DIAGNOSIS — S22000S Wedge compression fracture of unspecified thoracic vertebra, sequela: Secondary | ICD-10-CM

## 2018-06-13 DIAGNOSIS — R55 Syncope and collapse: Secondary | ICD-10-CM

## 2018-06-13 DIAGNOSIS — Z79891 Long term (current) use of opiate analgesic: Secondary | ICD-10-CM

## 2018-06-13 DIAGNOSIS — E118 Type 2 diabetes mellitus with unspecified complications: Secondary | ICD-10-CM | POA: Diagnosis not present

## 2018-06-13 DIAGNOSIS — I700 Atherosclerosis of aorta: Secondary | ICD-10-CM

## 2018-06-13 DIAGNOSIS — H3552 Pigmentary retinal dystrophy: Secondary | ICD-10-CM

## 2018-06-13 DIAGNOSIS — E781 Pure hyperglyceridemia: Secondary | ICD-10-CM

## 2018-06-13 DIAGNOSIS — E669 Obesity, unspecified: Secondary | ICD-10-CM

## 2018-06-13 DIAGNOSIS — E66811 Obesity, class 1: Secondary | ICD-10-CM

## 2018-06-13 DIAGNOSIS — S22000A Wedge compression fracture of unspecified thoracic vertebra, initial encounter for closed fracture: Secondary | ICD-10-CM | POA: Insufficient documentation

## 2018-06-13 DIAGNOSIS — K219 Gastro-esophageal reflux disease without esophagitis: Secondary | ICD-10-CM

## 2018-06-13 DIAGNOSIS — E1140 Type 2 diabetes mellitus with diabetic neuropathy, unspecified: Secondary | ICD-10-CM

## 2018-06-13 DIAGNOSIS — K760 Fatty (change of) liver, not elsewhere classified: Secondary | ICD-10-CM

## 2018-06-13 DIAGNOSIS — G4733 Obstructive sleep apnea (adult) (pediatric): Secondary | ICD-10-CM

## 2018-06-13 HISTORY — DX: Atherosclerosis of aorta: I70.0

## 2018-06-13 NOTE — Assessment & Plan Note (Signed)
Per pain clinic 

## 2018-06-13 NOTE — Progress Notes (Signed)
BP 124/76   Pulse 98   Temp 98.3 F (36.8 C)   Ht 5\' 9"  (1.753 m)   Wt 225 lb 8 oz (102.3 kg)   SpO2 96%   BMI 33.30 kg/m    Subjective:    Patient ID: Joel Bryant, male    DOB: September 27, 1961, 57 y.o.   MRN: [id number]  HPI: Joel Bryant is a 57 y.o. male  Chief Complaint  Patient presents with  . Follow-up    HPI Patient is here for f/u but he has been to the ER recently; he did not have an ER follow-up visit  Hypertension rx atenolol 100mg , lisniopril 2.5mg  daily; takes medications daily.  ER follow-up Had lightheadedness 3 weeks ago with syncopal episode went to ED a day or two later and noted acute appearing mild T 12 superior endplate compression deformity (acute on CT scan). States since then has not had any orthostatic symptoms. States has pain in left rib from fall but no chest pain, headaches or vision changes. He has no idea why he passed out ER note reviewed; presented after several falls after days of copious diarrhea, hit his head, presyncopal per their note, no synocpe; no chest pain; CT scan of head done, CT abd/pelv; adamantly refused admission Lipase mildly elevated; drinks 1-12 beers a week, varies; he was not drinking prior to the falls Bilirubin was elevated too (1.5) Grand mal seizures in MOTHER at age 56; doctor in ER said these were not seizures that he had; patient does not want to see a neurologist; he 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  Abdominal/pelvis CT CLINICAL DATA:  Recent fall with abdominal pain, initial encounter  EXAM: CT ABDOMEN AND PELVIS WITH CONTRAST  TECHNIQUE: Multidetector CT imaging of the abdomen and pelvis was performed using the standard protocol following bolus administration of intravenous contrast.  CONTRAST:  139mL ISOVUE-370 IOPAMIDOL (ISOVUE-370) INJECTION 76%  COMPARISON:  None  FINDINGS: Lower chest: No acute abnormality.  Hepatobiliary: Diffuse fatty infiltration of the liver is noted. The gallbladder is within  normal limits.  Pancreas: Unremarkable. No pancreatic ductal dilatation or surrounding inflammatory changes.  Spleen: Normal in size without focal abnormality.  Adrenals/Urinary Tract: The adrenal glands are within normal limits. No renal calculi or obstructive changes are seen. Lower pole right renal cyst is noted. The bladder is well distended.  Stomach/Bowel: Stomach is within normal limits. Appendix appears normal. No evidence of bowel wall thickening, distention, or inflammatory changes.  Vascular/Lymphatic: Aortic atherosclerosis. No enlarged abdominal or pelvic lymph nodes.  Reproductive: Prostate is unremarkable.  Other: No abdominal wall hernia or abnormality. No abdominopelvic ascites.  Musculoskeletal: Degenerative changes of lumbar spine are noted. Mild T12 superior endplate compression deformity is noted which appears acute in nature.  IMPRESSION: Acute appearing T12 superior endplate compression deformity of a mild degree.  No other acute abnormality is noted.   Electronically Signed   By: Inez Catalina M.D.   On: 05/22/2018 10:59  Chest xray IMPRESSION: No active cardiopulmonary disease.  Question a right rib fracture of unknown age. Dedicated rib images not obtained.   Electronically Signed   By: Markus Daft M.D.   On: 05/22/2018 09:13   Head CT IMPRESSION: Negative head CT.   Electronically Signed   By: Logan Bores M.D.   On: 05/22/2018 08:53    Office Visit from 06/13/2018 in [facility]  AUDIT-C Score  1      BP Readings from Last 3 Encounters:  06/13/18 124/76  05/22/18 139/81  03/12/18 134/86   Fall Risk  06/13/2018 03/12/2018 12/13/2017 09/13/2017 09/13/2017  Falls in the past year? Yes No No No No  Number falls in past yr: 1 - - - -  Injury with Fall? Yes - - - -    Type 2 Diabetes rx metformin 1000mg  BID and januvia 50mg  daily. No missed doses.  Has been eating well and lost 40 pounds  in march and April- cut out sugar but has been having trouble losing weight since then states keeps retaining fluid. Eats a lot of bagged beans that he cooks; oven roasted [place].  No hx of MTC; no fam hx of MEN-2  Lab Results  Component Value Date   HGBA1C 5.9 (H) 03/12/2018   Wt Readings from Last 3 Encounters:  06/13/18 225 lb 8 oz (102.3 kg)  05/22/18 220 lb (99.8 kg)  03/12/18 224 lb 6.4 oz (101.8 kg)    Hyperlipidemia rx atorvastatin 20mg  and lovaza 2g BID. LDL 75. Motivate to lose weight.   Lab Results  Component Value Date   CHOL 146 03/12/2018   HDL 46 03/12/2018   LDLCALC 75 03/12/2018   TRIG 148 03/12/2018   CHOLHDL 3.2 03/12/2018    OSA Does not wear CPAP- states the mask messed up his nose so he sleeps on elevated pillows  GERD Takes nexium 40mg  daily states symptoms are controlled but wants to see about coming off. Never had EGD no dx of esophagitis.  Pain  has multiple orthopedic concerns- lumbar pand bilateral hip pain and new thoracic injury follows up with [facility] neurosurgery. New compression fracture at T12 is causing pain  Obesity Fluctuates wth his weight; willing to try a GLP-1  Depression screen [facility] 2/9 06/13/2018 03/12/2018 12/13/2017 09/13/2017 09/13/2017  Decreased Interest 0 0 0 0 0  Down, Depressed, Hopeless 0 0 0 0 0  PHQ - 2 Score 0 0 0 0 0    Relevant past medical, surgical, family and social history reviewed Past Medical History:  Diagnosis Date  . Anxiety   . Aortic atherosclerosis ([place]) 06/13/2018   July 2019 CT scan  . Chronic LBP 09/13/2015  . Chronic left hip pain 09/27/2015  . Diabetes mellitus without complication ([place])   . Fracture, vertebra, pathologic   . GERD (gastroesophageal reflux disease)   . Hyperlipidemia   . Hyperlipidemia   . Hypertension   . Hypomagnesemia    Past Surgical History:  Procedure Laterality Date  . CATARACT EXTRACTION  1992   Insert Prosthetic Lens  . COLONOSCOPY WITH PROPOFOL N/A 02/28/2017     Procedure: COLONOSCOPY WITH PROPOFOL;  Surgeon: Manya Silvas, MD;  Location: [facility] ENDOSCOPY;  Service: Endoscopy;  Laterality: N/A;  . EYE SURGERY    . LAMINECTOMY  2001   lumbar  . MULTIPLE TOOTH EXTRACTIONS     2018  . TIBIA FRACTURE SURGERY    . TONSILLECTOMY    . WRIST SURGERY Right    x2   Family History  Problem Relation Age of Onset  . Colon polyps Mother   . Retinitis pigmentosa Mother   . Retinitis pigmentosa Maternal Grandmother   . Cancer Maternal Grandfather   . Diabetes Paternal Grandmother   . Alcohol abuse Father    Social History   Tobacco Use  . Smoking status: Former Smoker    Types: Cigarettes  . Smokeless tobacco: Never Used  . Tobacco comment: Quit smoking 4 years now  Substance Use Topics  . Alcohol use: Not  Currently    Alcohol/week: 0.0 standard drinks    Comment: occassional/seldom  . Drug use: No    Comment: in past used marijuana     Interim medical history since last visit reviewed. Allergies and medications reviewed  Review of Systems Per HPI unless specifically indicated above     Objective:    BP 124/76   Pulse 98   Temp 98.3 F (36.8 C)   Ht 5\' 9"  (1.753 m)   Wt 225 lb 8 oz (102.3 kg)   SpO2 96%   BMI 33.30 kg/m   Wt Readings from Last 3 Encounters:  06/13/18 225 lb 8 oz (102.3 kg)  05/22/18 220 lb (99.8 kg)  03/12/18 224 lb 6.4 oz (101.8 kg)    Physical Exam  Constitutional: He appears well-developed and well-nourished. No distress.  HENT:  Head: Normocephalic and atraumatic.  Eyes: EOM are normal. No scleral icterus.  Neck: No thyromegaly present.  Cardiovascular: Normal rate and regular rhythm.  Pulmonary/Chest: Effort normal and breath sounds normal.  Abdominal: Soft. Bowel sounds are normal. He exhibits no distension.  Musculoskeletal: He exhibits no edema.       Lumbar back: He exhibits tenderness.       Back:  Neurological: Coordination normal.  Skin: Skin is warm and dry. No pallor.  Psychiatric:  He has a normal mood and affect. His behavior is normal. Judgment and thought content normal. His mood appears not anxious. He does not exhibit a depressed mood.   Diabetic Foot Form - Detailed   Diabetic Foot Exam - detailed Diabetic Foot exam was performed with the following findings:  Yes 06/13/2018 10:23 AM  Visual Foot Exam completed.:  Yes  Is there elevated skin temparature?:  No Pulse Foot Exam completed.:  Yes  Sensory Foot Exam Completed.:  Yes Semmes-Weinstein Monofilament Test   Comments:  No sensation of 9 in right foot and decreased sensation in left foot.      Results for orders placed or performed during the hospital encounter of 05/22/18  Glucose, capillary  Result Value Ref Range   Glucose-Capillary 129 (H) 70 - 99 mg/dL  Comprehensive metabolic panel  Result Value Ref Range   Sodium 137 135 - 145 mmol/L   Potassium 3.9 3.5 - 5.1 mmol/L   Chloride 100 98 - 111 mmol/L   CO2 28 22 - 32 mmol/L   Glucose, Bld 122 (H) 70 - 99 mg/dL   BUN 13 6 - 20 mg/dL   Creatinine, Ser 0.89 0.61 - 1.24 mg/dL   Calcium 9.8 8.9 - 10.3 mg/dL   Total Protein 8.3 (H) 6.5 - 8.1 g/dL   Albumin 5.0 3.5 - 5.0 g/dL   AST 37 15 - 41 U/L   ALT 23 0 - 44 U/L   Alkaline Phosphatase 57 38 - 126 U/L   Total Bilirubin 1.5 (H) 0.3 - 1.2 mg/dL   GFR calc non Af Amer >60 >60 mL/min   GFR calc Af Amer >60 >60 mL/min   Anion gap 9 5 - 15  CBC with Differential  Result Value Ref Range   WBC 9.9 3.8 - 10.6 K/uL   RBC 4.84 4.40 - 5.90 MIL/uL   Hemoglobin 15.6 13.0 - 18.0 g/dL   HCT 42.5 40.0 - 52.0 %   MCV 88.0 80.0 - 100.0 fL   MCH 32.2 26.0 - 34.0 pg   MCHC 36.6 (H) 32.0 - 36.0 g/dL   RDW 14.3 11.5 - 14.5 %   Platelets 231 150 -  440 K/uL   Neutrophils Relative % 73 %   Neutro Abs 7.2 (H) 1.4 - 6.5 K/uL   Lymphocytes Relative 17 %   Lymphs Abs 1.7 1.0 - 3.6 K/uL   Monocytes Relative 8 %   Monocytes Absolute 0.8 0.2 - 1.0 K/uL   Eosinophils Relative 1 %   Eosinophils Absolute 0.1 0 - 0.7  K/uL   Basophils Relative 1 %   Basophils Absolute 0.1 0 - 0.1 K/uL  Protime-INR  Result Value Ref Range   Prothrombin Time 13.5 11.4 - 15.2 seconds   INR 1.04   Lipase, blood  Result Value Ref Range   Lipase 76 (H) 11 - 51 U/L  Troponin I  Result Value Ref Range   Troponin I <0.03 <0.03 ng/mL  Urinalysis, Complete w Microscopic  Result Value Ref Range   Color, Urine YELLOW (A) YELLOW   APPearance CLEAR (A) CLEAR   Specific Gravity, Urine 1.006 1.005 - 1.030   pH 6.0 5.0 - 8.0   Glucose, UA NEGATIVE NEGATIVE mg/dL   Hgb urine dipstick NEGATIVE NEGATIVE   Bilirubin Urine NEGATIVE NEGATIVE   Ketones, ur NEGATIVE NEGATIVE mg/dL   Protein, ur NEGATIVE NEGATIVE mg/dL   Nitrite NEGATIVE NEGATIVE   Leukocytes, UA NEGATIVE NEGATIVE   WBC, UA NONE SEEN 0 - 5 WBC/hpf   Bacteria, UA NONE SEEN NONE SEEN   Squamous Epithelial / LPF NONE SEEN 0 - 5   Mucus PRESENT       Assessment & Plan:   Problem List Items Addressed This Visit      Cardiovascular and Mediastinum   BP (high blood pressure)    Controlled today; continue renal protective dose of ACE-I      Aortic atherosclerosis (HCC)    Goal LDL is less than 70 and under can hope for plaque regression        Respiratory   Obstructive apnea    Not wearing mask; encouraged weight loss; offered pulm referral versus changing mask        Digestive   Fatty liver    Weight loss encouraged and we are working on it; consider GLP-1        Endocrine   Diabetes mellitus, type 2 ([place]) - Primary   Relevant Orders   Hemoglobin A1c   Lipid panel   Controlled type 2 diabetes mellitus with diabetic neuropathy, without long-term current use of insulin (HCC)    Check A1c; foot exam by DNP        Musculoskeletal and Integument   Closed compression fracture of thoracic vertebra (HCC)     Other   Dyslipidemia   Relevant Orders   Lipid panel   Retinitis pigmentosa    Legally blind, 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      Obesity (BMI 30.0-34.9)     He cut out Coke, discussed GLP-1 with him; will see what labs show first (TG, liver function tests); we both believe that if he lost significant weight, he could reduce his medicines      Long term prescription opiate use (Chronic)    Per pain clinic      Hypomagnesemia    Check Mg2+      Relevant Orders   Magnesium   Hypertriglyceridemia    Controlled with lovaza       Other Visit Diagnoses    Gastroesophageal reflux disease, esophagitis presence not specified       OSA (obstructive sleep apnea)       Pre-syncope  reviewed ER notes, labs, imaging reports with patient; he declines neuro referral   Hyperbilirubinemia       noted in teh ER; will recheck today   Relevant Orders   Hepatic function panel   Elevated lipase       noted in ER; will recheck today   Relevant Orders   Lipase      Diabetic Foot Form - Detailed   Diabetic Foot Exam - detailed Diabetic Foot exam was performed with the following findings:  Yes 06/13/2018 10:23 AM  Visual Foot Exam completed.:  Yes  Is there elevated skin temparature?:  No Pulse Foot Exam completed.:  Yes  Sensory Foot Exam Completed.:  Yes Semmes-Weinstein Monofilament Test   Comments:  No sensation of 9 in right foot and decreased sensation in left foot.      Follow up plan: Return in about 3 months (around 09/13/2018) for follow-up visit with Dr. Sanda Klein.  An after-visit summary was printed and given to the patient at [place].  Please see the patient instructions which may contain other information and recommendations beyond what is mentioned above in the assessment and plan.  No orders of the defined types were placed in this encounter.   Orders Placed This Encounter  Procedures  . Lipase  . Hepatic function panel  . Magnesium  . Hemoglobin A1c  . Lipid panel   Face-to-face time with patient was more than 40 minutes, >50% time spent counseling and coordination of care

## 2018-06-13 NOTE — Assessment & Plan Note (Addendum)
He cut out Coke, discussed GLP-1 with him; will see what labs show first (TG, liver function tests); we both believe that if he lost significant weight, he could reduce his medicines

## 2018-06-13 NOTE — Assessment & Plan Note (Signed)
Not wearing mask; encouraged weight loss; offered pulm referral versus changing mask

## 2018-06-13 NOTE — Assessment & Plan Note (Addendum)
Weight loss encouraged and we are working on it; consider GLP-1

## 2018-06-13 NOTE — Assessment & Plan Note (Signed)
Controlled with lovaza

## 2018-06-13 NOTE — Assessment & Plan Note (Signed)
Check A1c; foot exam by DNP

## 2018-06-13 NOTE — Assessment & Plan Note (Signed)
Goal LDL is less than 70 and under can hope for plaque regression

## 2018-06-13 NOTE — Assessment & Plan Note (Signed)
Controlled today; continue renal protective dose of ACE-I

## 2018-06-13 NOTE — Assessment & Plan Note (Signed)
Legally blind, 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

## 2018-06-13 NOTE — Assessment & Plan Note (Signed)
Check Mg2+ 

## 2018-06-13 NOTE — Patient Instructions (Addendum)
Check out the information at familydoctor.org entitled "Nutrition for Weight Loss: What You Need to Know about Fad Diets" Try to lose between 1-2 pounds per week by taking in fewer calories and burning off more calories You can succeed by limiting portions, limiting foods dense in calories and fat, becoming more active, and drinking 8 glasses of water a day (64 ounces) Don't skip meals, especially breakfast, as skipping meals may alter your metabolism Do not use over-the-counter weight loss pills or gimmicks that claim rapid weight loss A healthy BMI (or body mass index) is between 18.5 and 24.9 You can calculate your ideal BMI at the [place] website [employer]fr  I'll suggest a referral to a neurologist Please do get back on the CPAP We'll get results from the labs today We will likely start you on a once a week shot for diabetes and weight loss

## 2018-06-14 LAB — LIPID PANEL
Cholesterol: 151 mg/dL (ref <200)
HDL: 51 mg/dL (ref >40)
LDL Cholesterol (Calc): 68 mg/dL (calc)
Non-HDL Cholesterol (Calc): 100 mg/dL (calc) (ref <130)
Total CHOL/HDL Ratio: 3 (calc) (ref <5.0)
Triglycerides: 227 mg/dL — ABNORMAL HIGH (ref <150)

## 2018-06-14 LAB — HEPATIC FUNCTION PANEL
AG Ratio: 1.9 (calc) (ref 1.0–2.5)
ALT: 16 U/L (ref 9–46)
AST: 25 U/L (ref 10–35)
Albumin: 4.5 g/dL (ref 3.6–5.1)
Alkaline phosphatase (APISO): 89 U/L (ref 40–115)
Bilirubin, Direct: 0.2 mg/dL (ref 0.0–0.2)
Globulin: 2.4 g/dL (calc) (ref 1.9–3.7)
Indirect Bilirubin: 0.6 mg/dL (calc) (ref 0.2–1.2)
Total Bilirubin: 0.8 mg/dL (ref 0.2–1.2)
Total Protein: 6.9 g/dL (ref 6.1–8.1)

## 2018-06-14 LAB — HEMOGLOBIN A1C
Hgb A1c MFr Bld: 5.5 % of total Hgb (ref <5.7)
Mean Plasma Glucose: 111 (calc)
eAG (mmol/L): 6.2 (calc)

## 2018-06-14 LAB — LIPASE: Lipase: 60 U/L (ref 7–60)

## 2018-06-14 LAB — MAGNESIUM: Magnesium: 1.6 mg/dL (ref 1.5–2.5)

## 2018-06-21 ENCOUNTER — Other Ambulatory Visit: Payer: Self-pay | Admitting: Family Medicine

## 2018-06-21 MED ORDER — DULAGLUTIDE 0.75 MG/0.5ML ~~LOC~~ SOAJ: SUBCUTANEOUS | 5 refills | Status: DC

## 2018-06-21 NOTE — Progress Notes (Signed)
STOP januvia Start GLP-1

## 2018-07-24 ENCOUNTER — Other Ambulatory Visit: Payer: Self-pay | Admitting: Orthopedic Surgery

## 2018-07-24 DIAGNOSIS — M545 Low back pain: Secondary | ICD-10-CM

## 2018-07-25 ENCOUNTER — Emergency Department
Admission: EM | Admit: 2018-07-25 | Discharge: 2018-07-25 | Disposition: A | Discharge status: Home / Self Care | Payer: Medicare Other | Attending: Emergency Medicine | Admitting: Emergency Medicine

## 2018-07-25 ENCOUNTER — Encounter: Payer: Self-pay | Admitting: Emergency Medicine

## 2018-07-25 ENCOUNTER — Other Ambulatory Visit: Payer: Self-pay

## 2018-07-25 DIAGNOSIS — Z7982 Long term (current) use of aspirin: Secondary | ICD-10-CM | POA: Insufficient documentation

## 2018-07-25 DIAGNOSIS — Z79899 Other long term (current) drug therapy: Secondary | ICD-10-CM | POA: Insufficient documentation

## 2018-07-25 DIAGNOSIS — Z7984 Long term (current) use of oral hypoglycemic drugs: Secondary | ICD-10-CM | POA: Insufficient documentation

## 2018-07-25 DIAGNOSIS — M5442 Lumbago with sciatica, left side: Secondary | ICD-10-CM | POA: Insufficient documentation

## 2018-07-25 DIAGNOSIS — M5441 Lumbago with sciatica, right side: Secondary | ICD-10-CM | POA: Diagnosis not present

## 2018-07-25 DIAGNOSIS — G8929 Other chronic pain: Secondary | ICD-10-CM

## 2018-07-25 DIAGNOSIS — I10 Essential (primary) hypertension: Secondary | ICD-10-CM | POA: Insufficient documentation

## 2018-07-25 DIAGNOSIS — Z87891 Personal history of nicotine dependence: Secondary | ICD-10-CM | POA: Insufficient documentation

## 2018-07-25 DIAGNOSIS — E119 Type 2 diabetes mellitus without complications: Secondary | ICD-10-CM | POA: Insufficient documentation

## 2018-07-25 DIAGNOSIS — M5489 Other dorsalgia: Secondary | ICD-10-CM | POA: Diagnosis present

## 2018-07-25 MED ORDER — ORPHENADRINE CITRATE 30 MG/ML IJ SOLN
60.0000 mg | Freq: Two times a day (BID) | INTRAMUSCULAR | Status: DC
  Administered 2018-07-25: 60 mg via INTRAVENOUS
  Filled 2018-07-25: qty 2

## 2018-07-25 MED ORDER — KETOROLAC TROMETHAMINE 10 MG PO TABS: 10.0000 mg | ORAL_TABLET | Freq: Four times a day (QID) | ORAL | 0 refills | Status: DC | PRN

## 2018-07-25 MED ORDER — KETOROLAC TROMETHAMINE 30 MG/ML IJ SOLN
30.0000 mg | Freq: Once | INTRAMUSCULAR | Status: AC
  Administered 2018-07-25: 30 mg via INTRAVENOUS
  Filled 2018-07-25: qty 1

## 2018-07-25 MED ORDER — MORPHINE SULFATE (PF) 4 MG/ML IV SOLN
4.0000 mg | Freq: Once | INTRAVENOUS | Status: AC
  Administered 2018-07-25: 4 mg via INTRAVENOUS
  Filled 2018-07-25: qty 1

## 2018-07-25 MED ORDER — LIDOCAINE 5 % EX PTCH
1.0000 | MEDICATED_PATCH | CUTANEOUS | Status: DC
  Administered 2018-07-25: 1 via TRANSDERMAL
  Filled 2018-07-25 (×2): qty 1

## 2018-07-25 MED ORDER — BACLOFEN 10 MG PO TABS: 10.0000 mg | ORAL_TABLET | Freq: Every day | ORAL | 0 refills | Status: DC

## 2018-07-25 MED ORDER — LIDOCAINE 5 % EX PTCH: 1.0000 | MEDICATED_PATCH | CUTANEOUS | 0 refills | Status: DC

## 2018-07-25 NOTE — ED Triage Notes (Signed)
Says been to 2 b ack doctors this week.  History of [facility].  Says just needs pain management.  Says he has arrangements for mri and to get his back fixed already.

## 2018-07-25 NOTE — ED Provider Notes (Signed)
[facility] Emergency Department Provider Note  ____________________________________________  Time seen: Approximately 8:45 AM  I have reviewed the triage vital signs and the nursing notes.   HISTORY  Chief Complaint Back Pain    HPI Joel Bryant is a 57 y.o. male that presents to emergency department for evaluation of chronic low back pain that radiates into bilateral legs.  Patient has a compression fracture in his back that he got 2 months ago.  He saw orthopedics and neurosurgery this past week.  He is scheduled for an MRI in 2 weeks.  Pain has not changed in all in character today.  He states that orthopedics gave him a prescription for 7.5 mg of oxycodone that has not helped.  He was discharged from the pain clinic.  No bowel or bladder dysfunction or saddle anesthesias.  No fever, chills.   Past Medical History:  Diagnosis Date  . Anxiety   . Aortic atherosclerosis ([place]) 06/13/2018   July 2019 CT scan  . Chronic LBP 09/13/2015  . Chronic left hip pain 09/27/2015  . Diabetes mellitus without complication ([place])   . Fracture, vertebra, pathologic   . GERD (gastroesophageal reflux disease)   . Hyperlipidemia   . Hyperlipidemia   . Hypertension   . Hypomagnesemia     Patient Active Problem List   Diagnosis Date Noted  . Obesity (BMI 30.0-34.9) 06/13/2018  . Closed compression fracture of thoracic vertebra ([place]) 06/13/2018  . Fatty liver 06/13/2018  . Aortic atherosclerosis ([place]) 06/13/2018  . Chronic pain syndrome 09/06/2016  . Diabetes mellitus, type 2 ([place]) 09/27/2015  . Can't get food down 09/27/2015  . H/O adenomatous polyp of colon 09/27/2015  . Leg swelling 09/27/2015  . Dysmetabolic syndrome 82/95/6213  . Adiposity 09/27/2015  . Pneumococcal vaccination given 09/27/2015  . Post Laminectomy Syndrome (L4-5 Hemilaminectomy and partial Facetectomy) 09/27/2015  . Dietary counseling and surveillance 09/27/2015  . Long term current use of  opiate analgesic 09/27/2015  . Long term prescription opiate use 09/27/2015  . Opiate use (30 MME/Day) 09/27/2015  . Encounter for therapeutic drug level monitoring 09/27/2015  . Encounter for chronic pain management 09/27/2015  . Lumbar spondylosis 09/27/2015  . 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 Epidural fibrosis 09/27/2015  . Chronic low back pain (Location of Primary Source of Pain) (Bilateral) (L>R) 09/27/2015  . Hypomagnesemia 09/27/2015  . Encounter for long-term (current) use of other high-risk medications 09/27/2015  . Encounter for opiate analgesic use agreement 09/27/2015  . Opioid use agreement exists 09/27/2015  . Chronic left knee pain 09/27/2015  . Lumbar spinal stenosis (L4-5) 09/27/2015  . Lumbar foraminal stenosis (Bilateral L3-4 and L4-5) 09/27/2015  . Lumbar facet hypertrophy (Bilateral, Multilevel) 09/27/2015  . Lumbar facet syndrome (Bilateral) 09/27/2015  . 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 Chronic hip pain (Location of Secondary source of pain) (Bilateral) (L>R) (secondary to osteonecrosis) 09/27/2015  . Edema leg 09/13/2015  . Gout 09/13/2015  . Obstructive apnea 09/13/2015  . Anxiety disorder 06/11/2015  . Hypertriglyceridemia 06/11/2015  . BP (high blood pressure) 06/11/2015  . Dyslipidemia 06/11/2015  . Controlled type 2 diabetes mellitus with diabetic neuropathy, without long-term current use of insulin ([place]) 06/11/2015  . Brash 06/11/2015  . 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 Retinitis pigmentosa 06/11/2015    Past Surgical History:  Procedure Laterality Date  . CATARACT EXTRACTION  1992   Insert Prosthetic Lens  . COLONOSCOPY WITH PROPOFOL N/A 02/28/2017   Procedure: COLONOSCOPY  WITH PROPOFOL;  Surgeon: Manya Silvas, MD;  Location: [facility] ENDOSCOPY;  Service: Endoscopy;  Laterality: N/A;  . EYE SURGERY    . LAMINECTOMY  2001   lumbar  . MULTIPLE TOOTH EXTRACTIONS     2018  . TIBIA  FRACTURE SURGERY    . TONSILLECTOMY    . WRIST SURGERY Right    x2    Prior to Admission medications   Medication Sig Start Date End Date Taking? Authorizing Provider  oxycodone-acetaminophen (LYNOX) 5-300 MG tablet Take 1 tablet by mouth every 4 (four) hours as needed for pain.   Yes [provider]  ACCU-CHEK SOFTCLIX LANCETS lancets USE AS DIRECTED TO CHECK BLOOD SUGAR TWICE A DAY 06/28/17   Roselee Nova, MD  aspirin 81 MG tablet Take 81 mg by mouth daily.    [provider]  atenolol (TENORMIN) 100 MG tablet Take 1 tablet (100 mg total) by mouth daily. 05/30/18   Poulose, Bethel Born, NP  atorvastatin (LIPITOR) 20 MG tablet Take 1 tablet (20 mg total) by mouth daily at 6 PM. 05/30/18   Poulose, Bethel Born, NP  baclofen (LIORESAL) 10 MG tablet Take 1 tablet (10 mg total) by mouth daily. 07/25/18 07/25/19  Laban Emperor, PA-C  Biotin 5000 MCG CAPS Take 5,000 capsules by mouth 2 (two) times daily.    [provider]  Cholecalciferol (VITAMIN D3) 2000 units TABS Take 2,000 tablets by mouth daily.    [provider]  diphenhydramine-acetaminophen (TYLENOL PM) 25-500 MG TABS tablet Take 1 tablet by mouth at bedtime as needed.    [provider]  Dulaglutide (TRULICITY) 4.09 BD/5.3GD SOPN Inject 0.5 ml subcutaneously once a week; STOP Januvia 06/21/18   Lada, Satira Anis, MD  esomeprazole (NEXIUM) 40 MG capsule Take 1 capsule (40 mg total) by mouth daily. 05/30/18   Poulose, Bethel Born, NP  furosemide (LASIX) 40 MG tablet Take 1 tablet (40 mg total) by mouth daily. Patient taking differently: Take 40 mg by mouth as needed.  12/13/17   Roselee Nova, MD  ibuprofen (ADVIL,MOTRIN) 800 MG tablet Take 800 mg by mouth every 8 (eight) hours as needed.    [provider]  ketorolac (TORADOL) 10 MG tablet Take 1 tablet (10 mg total) by mouth every 6 (six) hours as needed. 07/25/18   Laban Emperor, PA-C  lidocaine (LIDODERM) 5 % Place 1 patch onto the  skin daily. Remove & Discard patch within 12 hours or as directed by MD 07/25/18   Laban Emperor, PA-C  lisinopril (PRINIVIL,ZESTRIL) 2.5 MG tablet Take 1 tablet (2.5 mg total) by mouth daily. 05/30/18   Poulose, Bethel Born, NP  metFORMIN (GLUCOPHAGE) 1000 MG tablet Take 1 tablet (1,000 mg total) by mouth 2 (two) times daily. 05/30/18   Poulose, Bethel Born, NP  Multiple Vitamins-Minerals (SENTRY SENIOR PO) Take 1 tablet by mouth.    [provider]  omega-3 acid ethyl esters (LOVAZA) 1 g capsule Take 2 capsules (2 g total) by mouth 2 (two) times daily. 05/30/18   Poulose, Bethel Born, NP  oxyCODONE-acetaminophen (PERCOCET) 5-325 MG tablet Take 1 tablet by mouth every 4 (four) hours as needed for severe pain. 05/22/18   Schuyler Amor, MD    Allergies Patient has no known allergies.  Family History  Problem Relation Age of Onset  . Colon polyps Mother   . Retinitis pigmentosa Mother   . Retinitis pigmentosa Maternal Grandmother   . Cancer Maternal Grandfather   . Diabetes  Paternal Grandmother   . Alcohol abuse Father     Social History Social History   Tobacco Use  . Smoking status: Former Smoker    Types: Cigarettes  . Smokeless tobacco: Never Used  . Tobacco comment: Quit smoking 4 years now  Substance Use Topics  . Alcohol use: Not Currently    Alcohol/week: 0.0 standard drinks    Comment: occassional/seldom  . Drug use: No    Comment: in past used marijuana      Review of Systems  Constitutional: No fever/chills Cardiovascular: No chest pain. Respiratory: No SOB. Gastrointestinal: No abdominal pain.  No nausea, no vomiting.  Musculoskeletal: Positive for back pain. Skin: Negative for rash, abrasions, lacerations, ecchymosis.  ____________________________________________   PHYSICAL EXAM:  VITAL SIGNS: ED Triage Vitals  Enc Vitals Group     BP 07/25/18 0833 (!) 145/89     Pulse Rate 07/25/18 0833 86     Resp 07/25/18 0833 16     Temp 07/25/18 0833 98.3  F (36.8 C)     Temp Source 07/25/18 0833 Oral     SpO2 07/25/18 0833 96 %     Weight 07/25/18 0834 220 lb (99.8 kg)     Height 07/25/18 0834 5\' 9"  (1.753 m)     Head Circumference --      Peak Flow --      Pain Score 07/25/18 0833 10     Pain Loc --      Pain Edu? --      Excl. in [place]? --      Constitutional: Alert and oriented. Well appearing and in no acute distress. Eyes: Conjunctivae are normal. PERRL. EOMI. Head: Atraumatic. ENT:      Ears:      Nose: No congestion/rhinnorhea.      Mouth/Throat: Mucous membranes are moist.  Neck: No stridor.\ Cardiovascular: Normal rate, regular rhythm.  Good peripheral circulation. Respiratory: Normal respiratory effort without tachypnea or retractions. Lungs CTAB. Good air entry to the bases with no decreased or absent breath sounds. Gastrointestinal: Bowel sounds 4 quadrants. Soft and nontender to palpation. No guarding or rigidity. No palpable masses. No distention.  Musculoskeletal: Full range of motion to all extremities. No gross deformities appreciated.  Diffuse tenderness to palpation throughout lumbar back.  Strength equal in lower extremities bilaterally.  Normal gait. Neurologic:  Normal speech and language. No gross focal neurologic deficits are appreciated.  Skin:  Skin is warm, dry and intact. No rash noted. Psychiatric: Mood and affect are normal. Speech and behavior are normal. Patient exhibits appropriate insight and judgement.   ____________________________________________   LABS (all labs ordered are listed, but only abnormal results are displayed)  Labs Reviewed - No data to display ____________________________________________  EKG   ____________________________________________  RADIOLOGY   No results found.  ____________________________________________    PROCEDURES  Procedure(s) performed:    Procedures    Medications  morphine 4 MG/ML injection 4 mg (4 mg Intravenous Given 07/25/18 0922)   ketorolac (TORADOL) 30 MG/ML injection 30 mg (30 mg Intravenous Given 07/25/18 0920)     ____________________________________________   INITIAL IMPRESSION / ASSESSMENT AND PLAN / ED COURSE  Pertinent labs & imaging results that were available during my care of the patient were reviewed by me and considered in my medical decision making (see chart for details).  Review of the [place] CSRS was performed in accordance of the [place] prior to dispensing any controlled drugs.     Patient presented to the emergency department  for pain control of chronic back pain.  Symptoms have not changed at all in character today.  He is seeing both neurosurgery and orthopedics.  Morphine, Toradol, Norflex were given.  Patient will be discharged home with prescriptions for Toradol, baclofen.  He will continue taking his current prescription for Percocet.  Patient is to follow up with orthopedics and neurosurgery as directed. Patient is given ED precautions to return to the ED for any worsening or new symptoms.     ____________________________________________  FINAL CLINICAL IMPRESSION(S) / ED DIAGNOSES  Final diagnoses:  Chronic bilateral low back pain with bilateral sciatica      NEW MEDICATIONS STARTED DURING THIS VISIT:  ED Discharge Orders         Ordered    ketorolac (TORADOL) 10 MG tablet  Every 6 hours PRN,   Status:  Discontinued     07/25/18 1003    baclofen (LIORESAL) 10 MG tablet  Daily     07/25/18 1003    ketorolac (TORADOL) 10 MG tablet  Every 6 hours PRN     07/25/18 1006    lidocaine (LIDODERM) 5 %  Every 24 hours     07/25/18 1006              This chart was dictated using voice recognition software/Dragon. Despite best efforts to proofread, errors can occur which can change the meaning. Any change was purely unintentional.    Laban Emperor, PA-C 07/25/18 1536    Lisa Roca, MD 07/27/18 [phone]

## 2018-07-25 NOTE — ED Notes (Addendum)
See triage note  States he is here for pain control of his back pain  States was dx'd with a compression fx of his back in July   Was seen yesterday and given percocet for pain   States he has a MRI scheduled in 2 weeks   States the pain meds are not working  Standing at bedside ,moaning . States his legs went numb yesterday

## 2018-07-26 ENCOUNTER — Ambulatory Visit
Admission: RE | Admit: 2018-07-26 | Discharge: 2018-07-26 | Disposition: A | Discharge status: Home / Self Care | Payer: Medicare Other | Source: Ambulatory Visit | Attending: Orthopedic Surgery | Admitting: Orthopedic Surgery

## 2018-07-26 DIAGNOSIS — M47816 Spondylosis without myelopathy or radiculopathy, lumbar region: Secondary | ICD-10-CM | POA: Insufficient documentation

## 2018-07-26 DIAGNOSIS — M5442 Lumbago with sciatica, left side: Secondary | ICD-10-CM | POA: Diagnosis present

## 2018-07-26 DIAGNOSIS — M47817 Spondylosis without myelopathy or radiculopathy, lumbosacral region: Secondary | ICD-10-CM | POA: Insufficient documentation

## 2018-07-26 DIAGNOSIS — S22000A Wedge compression fracture of unspecified thoracic vertebra, initial encounter for closed fracture: Secondary | ICD-10-CM | POA: Diagnosis present

## 2018-07-26 DIAGNOSIS — M5441 Lumbago with sciatica, right side: Secondary | ICD-10-CM | POA: Insufficient documentation

## 2018-07-26 DIAGNOSIS — M545 Low back pain: Secondary | ICD-10-CM

## 2018-08-06 ENCOUNTER — Encounter
Admission: RE | Disposition: A | Discharge status: Home / Self Care | Payer: Self-pay | Source: Ambulatory Visit | Attending: Orthopedic Surgery

## 2018-08-06 ENCOUNTER — Encounter: Payer: Self-pay | Admitting: Anesthesiology

## 2018-08-06 ENCOUNTER — Ambulatory Visit
Admission: RE | Admit: 2018-08-06 | Discharge: 2018-08-06 | Disposition: A | Discharge status: Home / Self Care | Payer: Medicare Other | Source: Ambulatory Visit | Attending: Orthopedic Surgery | Admitting: Orthopedic Surgery

## 2018-08-06 DIAGNOSIS — Z5309 Procedure and treatment not carried out because of other contraindication: Secondary | ICD-10-CM | POA: Insufficient documentation

## 2018-08-06 DIAGNOSIS — X58XXXA Exposure to other specified factors, initial encounter: Secondary | ICD-10-CM | POA: Diagnosis not present

## 2018-08-06 DIAGNOSIS — M4854XA Collapsed vertebra, not elsewhere classified, thoracic region, initial encounter for fracture: Secondary | ICD-10-CM | POA: Insufficient documentation

## 2018-08-06 LAB — GLUCOSE, CAPILLARY: Glucose-Capillary: 128 mg/dL — ABNORMAL HIGH (ref 70–99)

## 2018-08-06 SURGERY — KYPHOPLASTY
Anesthesia: Choice

## 2018-08-06 MED ORDER — CEFAZOLIN SODIUM-DEXTROSE 2-4 GM/100ML-% IV SOLN: 2.0000 g | Freq: Once | INTRAVENOUS | Status: DC

## 2018-08-06 MED ORDER — CEFAZOLIN SODIUM-DEXTROSE 2-4 GM/100ML-% IV SOLN
INTRAVENOUS | Status: AC
  Filled 2018-08-06: qty 100

## 2018-08-06 MED ORDER — SODIUM CHLORIDE 0.9 % IV SOLN: INTRAVENOUS | Status: DC

## 2018-08-06 MED ORDER — MIDAZOLAM HCL 2 MG/2ML IJ SOLN
INTRAMUSCULAR | Status: AC
  Filled 2018-08-06: qty 2

## 2018-08-06 MED ORDER — FAMOTIDINE 20 MG PO TABS
ORAL_TABLET | ORAL | Status: AC
  Filled 2018-08-06: qty 1

## 2018-08-06 SURGICAL SUPPLY — 16 items
ADH SKN CLS APL DERMABOND .7 (GAUZE/BANDAGES/DRESSINGS) ×1
DERMABOND ADVANCED (GAUZE/BANDAGES/DRESSINGS) ×1
DERMABOND ADVANCED .7 DNX12 (GAUZE/BANDAGES/DRESSINGS) ×2 IMPLANT
DEVICE BIOPSY BONE KYPHX (INSTRUMENTS) ×3 IMPLANT
DRAPE C-ARM XRAY 36X54 (DRAPES) ×3 IMPLANT
DURAPREP 26ML APPLICATOR (WOUND CARE) ×3 IMPLANT
GLOVE SURG SYN 9.0  PF PI (GLOVE) ×1
GLOVE SURG SYN 9.0 PF PI (GLOVE) ×2 IMPLANT
GOWN SRG 2XL LVL 4 RGLN SLV (GOWNS) ×2 IMPLANT
GOWN STRL NON-REIN 2XL LVL4 (GOWNS) ×2
GOWN STRL REUS W/ TWL LRG LVL3 (GOWN DISPOSABLE) ×2 IMPLANT
GOWN STRL REUS W/TWL LRG LVL3 (GOWN DISPOSABLE) ×2
PACK KYPHOPLASTY (MISCELLANEOUS) ×3 IMPLANT
STRAP SAFETY 5IN WIDE (MISCELLANEOUS) ×3 IMPLANT
TRAY KYPHOPAK 15/3 EXPRESS 1ST (MISCELLANEOUS) ×3 IMPLANT
TRAY KYPHOPAK 20/3 EXPRESS 1ST (MISCELLANEOUS) ×3 IMPLANT

## 2018-08-06 NOTE — OR Nursing (Signed)
Drank milk at 0900. Anesthesia notified and stated it would have to be a six hour delay. Dr Rudene Christians notified and cancelled surgery. To be rescheduled Thursday October 10th.

## 2018-08-08 ENCOUNTER — Ambulatory Visit: Payer: Medicare Other | Admitting: Anesthesiology

## 2018-08-08 ENCOUNTER — Ambulatory Visit
Admission: RE | Admit: 2018-08-08 | Discharge: 2018-08-08 | Disposition: A | Discharge status: Home / Self Care | Payer: Medicare Other | Source: Ambulatory Visit | Attending: Orthopedic Surgery | Admitting: Orthopedic Surgery

## 2018-08-08 ENCOUNTER — Ambulatory Visit: Payer: Medicare Other

## 2018-08-08 ENCOUNTER — Encounter: Payer: Self-pay | Admitting: *Deleted

## 2018-08-08 ENCOUNTER — Encounter
Admission: RE | Disposition: A | Discharge status: Home / Self Care | Payer: Self-pay | Source: Ambulatory Visit | Attending: Orthopedic Surgery

## 2018-08-08 DIAGNOSIS — E785 Hyperlipidemia, unspecified: Secondary | ICD-10-CM | POA: Diagnosis not present

## 2018-08-08 DIAGNOSIS — Z79899 Other long term (current) drug therapy: Secondary | ICD-10-CM | POA: Insufficient documentation

## 2018-08-08 DIAGNOSIS — K219 Gastro-esophageal reflux disease without esophagitis: Secondary | ICD-10-CM | POA: Diagnosis not present

## 2018-08-08 DIAGNOSIS — E119 Type 2 diabetes mellitus without complications: Secondary | ICD-10-CM | POA: Diagnosis not present

## 2018-08-08 DIAGNOSIS — G4730 Sleep apnea, unspecified: Secondary | ICD-10-CM | POA: Diagnosis not present

## 2018-08-08 DIAGNOSIS — Z87891 Personal history of nicotine dependence: Secondary | ICD-10-CM | POA: Diagnosis not present

## 2018-08-08 DIAGNOSIS — M549 Dorsalgia, unspecified: Secondary | ICD-10-CM | POA: Diagnosis present

## 2018-08-08 DIAGNOSIS — I10 Essential (primary) hypertension: Secondary | ICD-10-CM | POA: Insufficient documentation

## 2018-08-08 DIAGNOSIS — G8929 Other chronic pain: Secondary | ICD-10-CM | POA: Diagnosis not present

## 2018-08-08 DIAGNOSIS — M5431 Sciatica, right side: Secondary | ICD-10-CM | POA: Insufficient documentation

## 2018-08-08 DIAGNOSIS — M5432 Sciatica, left side: Secondary | ICD-10-CM | POA: Insufficient documentation

## 2018-08-08 DIAGNOSIS — Z7982 Long term (current) use of aspirin: Secondary | ICD-10-CM | POA: Diagnosis not present

## 2018-08-08 DIAGNOSIS — Z7984 Long term (current) use of oral hypoglycemic drugs: Secondary | ICD-10-CM | POA: Diagnosis not present

## 2018-08-08 DIAGNOSIS — M4854XA Collapsed vertebra, not elsewhere classified, thoracic region, initial encounter for fracture: Secondary | ICD-10-CM | POA: Insufficient documentation

## 2018-08-08 DIAGNOSIS — M545 Low back pain: Secondary | ICD-10-CM | POA: Insufficient documentation

## 2018-08-08 DIAGNOSIS — Z419 Encounter for procedure for purposes other than remedying health state, unspecified: Secondary | ICD-10-CM

## 2018-08-08 HISTORY — PX: KYPHOPLASTY: SHX5884

## 2018-08-08 LAB — GLUCOSE, CAPILLARY
Glucose-Capillary: 135 mg/dL — ABNORMAL HIGH (ref 70–99)
Glucose-Capillary: 128 mg/dL — ABNORMAL HIGH (ref 70–99)

## 2018-08-08 SURGERY — KYPHOPLASTY
Anesthesia: General

## 2018-08-08 MED ORDER — DEXAMETHASONE SODIUM PHOSPHATE 10 MG/ML IJ SOLN
INTRAMUSCULAR | Status: DC | PRN
  Administered 2018-08-08: 5 mg via INTRAVENOUS

## 2018-08-08 MED ORDER — ONDANSETRON HCL 4 MG/2ML IJ SOLN: 4.0000 mg | Freq: Four times a day (QID) | INTRAMUSCULAR | Status: DC | PRN

## 2018-08-08 MED ORDER — FAMOTIDINE 20 MG PO TABS: 20.0000 mg | ORAL_TABLET | Freq: Once | ORAL | Status: DC

## 2018-08-08 MED ORDER — PROPOFOL 500 MG/50ML IV EMUL
INTRAVENOUS | Status: AC
  Filled 2018-08-08: qty 50

## 2018-08-08 MED ORDER — HYDROCODONE-ACETAMINOPHEN 5-325 MG PO TABS
1.0000 | ORAL_TABLET | ORAL | Status: DC | PRN
  Administered 2018-08-08: 1 via ORAL

## 2018-08-08 MED ORDER — OXYCODONE-ACETAMINOPHEN 5-325 MG PO TABS: 1.0000 | ORAL_TABLET | Freq: Once | ORAL | Status: DC

## 2018-08-08 MED ORDER — MIDAZOLAM HCL 2 MG/2ML IJ SOLN
INTRAMUSCULAR | Status: DC | PRN
  Administered 2018-08-08: 2 mg via INTRAVENOUS

## 2018-08-08 MED ORDER — LIDOCAINE HCL (PF) 2 % IJ SOLN
INTRAMUSCULAR | Status: AC
  Filled 2018-08-08: qty 10

## 2018-08-08 MED ORDER — BUPIVACAINE-EPINEPHRINE (PF) 0.5% -1:200000 IJ SOLN
INTRAMUSCULAR | Status: DC | PRN
  Administered 2018-08-08: 20 mL via PERINEURAL

## 2018-08-08 MED ORDER — SODIUM CHLORIDE 0.9 % IV SOLN: INTRAVENOUS | Status: DC

## 2018-08-08 MED ORDER — CEFAZOLIN SODIUM-DEXTROSE 2-4 GM/100ML-% IV SOLN
2.0000 g | Freq: Once | INTRAVENOUS | Status: AC
  Administered 2018-08-08: 2 g via INTRAVENOUS

## 2018-08-08 MED ORDER — OXYCODONE HCL 5 MG/5ML PO SOLN: 5.0000 mg | Freq: Once | ORAL | Status: DC | PRN

## 2018-08-08 MED ORDER — DEXAMETHASONE SODIUM PHOSPHATE 10 MG/ML IJ SOLN
INTRAMUSCULAR | Status: AC
  Filled 2018-08-08: qty 1

## 2018-08-08 MED ORDER — DEXTROSE 5 % IV SOLN: 2000.0000 mg | Freq: Once | INTRAVENOUS | Status: DC

## 2018-08-08 MED ORDER — MIDAZOLAM HCL 2 MG/2ML IJ SOLN
INTRAMUSCULAR | Status: AC
  Filled 2018-08-08: qty 2

## 2018-08-08 MED ORDER — FENTANYL CITRATE (PF) 100 MCG/2ML IJ SOLN
INTRAMUSCULAR | Status: DC | PRN
  Administered 2018-08-08 (×2): 25 ug via INTRAVENOUS
  Administered 2018-08-08: 50 ug via INTRAVENOUS

## 2018-08-08 MED ORDER — METOCLOPRAMIDE HCL 5 MG/ML IJ SOLN: 5.0000 mg | Freq: Three times a day (TID) | INTRAMUSCULAR | Status: DC | PRN

## 2018-08-08 MED ORDER — LIDOCAINE HCL URETHRAL/MUCOSAL 2 % EX GEL
CUTANEOUS | Status: DC | PRN
  Administered 2018-08-08: 1 via TOPICAL

## 2018-08-08 MED ORDER — SODIUM CHLORIDE 0.9 % IV SOLN
INTRAVENOUS | Status: DC
  Administered 2018-08-08: 15:00:00 via INTRAVENOUS

## 2018-08-08 MED ORDER — HYDROCODONE-ACETAMINOPHEN 5-325 MG PO TABS
ORAL_TABLET | ORAL | Status: AC
  Filled 2018-08-08: qty 1

## 2018-08-08 MED ORDER — LIDOCAINE HCL URETHRAL/MUCOSAL 2 % EX GEL
CUTANEOUS | Status: AC
  Filled 2018-08-08: qty 5

## 2018-08-08 MED ORDER — IOPAMIDOL (ISOVUE-M 200) INJECTION 41%
INTRAMUSCULAR | Status: DC | PRN
  Administered 2018-08-08: 40 mL

## 2018-08-08 MED ORDER — CEFAZOLIN SODIUM-DEXTROSE 2-4 GM/100ML-% IV SOLN
INTRAVENOUS | Status: AC
  Filled 2018-08-08: qty 100

## 2018-08-08 MED ORDER — LIDOCAINE HCL 1 % IJ SOLN
INTRAMUSCULAR | Status: DC | PRN
  Administered 2018-08-08: 30 mL

## 2018-08-08 MED ORDER — LIDOCAINE HCL (CARDIAC) PF 100 MG/5ML IV SOSY
PREFILLED_SYRINGE | INTRAVENOUS | Status: DC | PRN
  Administered 2018-08-08: 100 mg via INTRAVENOUS

## 2018-08-08 MED ORDER — PROPOFOL 500 MG/50ML IV EMUL
INTRAVENOUS | Status: DC | PRN
  Administered 2018-08-08: 100 ug/kg/min via INTRAVENOUS

## 2018-08-08 MED ORDER — METOCLOPRAMIDE HCL 10 MG PO TABS: 5.0000 mg | ORAL_TABLET | Freq: Three times a day (TID) | ORAL | Status: DC | PRN

## 2018-08-08 MED ORDER — PROPOFOL 10 MG/ML IV BOLUS
INTRAVENOUS | Status: DC | PRN
  Administered 2018-08-08: 50 mg via INTRAVENOUS

## 2018-08-08 MED ORDER — PROPOFOL 10 MG/ML IV BOLUS
INTRAVENOUS | Status: AC
  Filled 2018-08-08: qty 20

## 2018-08-08 MED ORDER — FENTANYL CITRATE (PF) 100 MCG/2ML IJ SOLN
25.0000 ug | INTRAMUSCULAR | Status: DC | PRN
  Administered 2018-08-08 (×4): 25 ug via INTRAVENOUS

## 2018-08-08 MED ORDER — ONDANSETRON HCL 4 MG/2ML IJ SOLN
INTRAMUSCULAR | Status: AC
  Filled 2018-08-08: qty 2

## 2018-08-08 MED ORDER — FAMOTIDINE 20 MG PO TABS
ORAL_TABLET | ORAL | Status: AC
  Filled 2018-08-08: qty 1

## 2018-08-08 MED ORDER — ONDANSETRON HCL 4 MG/2ML IJ SOLN
INTRAMUSCULAR | Status: DC | PRN
  Administered 2018-08-08: 4 mg via INTRAVENOUS

## 2018-08-08 MED ORDER — FENTANYL CITRATE (PF) 100 MCG/2ML IJ SOLN
INTRAMUSCULAR | Status: AC
  Filled 2018-08-08: qty 2

## 2018-08-08 MED ORDER — ONDANSETRON HCL 4 MG PO TABS: 4.0000 mg | ORAL_TABLET | Freq: Four times a day (QID) | ORAL | Status: DC | PRN

## 2018-08-08 MED ORDER — OXYCODONE HCL 5 MG PO TABS: 5.0000 mg | ORAL_TABLET | Freq: Once | ORAL | Status: DC | PRN

## 2018-08-08 MED ORDER — FENTANYL CITRATE (PF) 100 MCG/2ML IJ SOLN
INTRAMUSCULAR | Status: AC
  Administered 2018-08-08: 25 ug via INTRAVENOUS
  Filled 2018-08-08: qty 2

## 2018-08-08 SURGICAL SUPPLY — 18 items
ADH SKN CLS APL DERMABOND .7 (GAUZE/BANDAGES/DRESSINGS) ×1
CEMENT KYPHON CX01A KIT/MIXER (Cement) ×2 IMPLANT
COVER WAND RF STERILE (DRAPES) ×2 IMPLANT
DERMABOND ADVANCED (GAUZE/BANDAGES/DRESSINGS) ×1
DERMABOND ADVANCED .7 DNX12 (GAUZE/BANDAGES/DRESSINGS) ×1 IMPLANT
DEVICE BIOPSY BONE KYPHX (INSTRUMENTS) ×2 IMPLANT
DRAPE C-ARM XRAY 36X54 (DRAPES) ×2 IMPLANT
DURAPREP 26ML APPLICATOR (WOUND CARE) ×2 IMPLANT
GLOVE SURG SYN 9.0  PF PI (GLOVE) ×1
GLOVE SURG SYN 9.0 PF PI (GLOVE) ×1 IMPLANT
GOWN SRG 2XL LVL 4 RGLN SLV (GOWNS) ×1 IMPLANT
GOWN STRL NON-REIN 2XL LVL4 (GOWNS) ×2
GOWN STRL REUS W/ TWL LRG LVL3 (GOWN DISPOSABLE) ×1 IMPLANT
GOWN STRL REUS W/TWL LRG LVL3 (GOWN DISPOSABLE) ×2
PACK KYPHOPLASTY (MISCELLANEOUS) ×2 IMPLANT
STRAP SAFETY 5IN WIDE (MISCELLANEOUS) ×2 IMPLANT
TRAY KYPHOPAK 15/3 EXPRESS 1ST (MISCELLANEOUS) ×1 IMPLANT
TRAY KYPHOPAK 20/3 EXPRESS 1ST (MISCELLANEOUS) ×2 IMPLANT

## 2018-08-08 NOTE — Discharge Instructions (Addendum)
Take it easy today and tomorrow.  Resume normal activities on Saturday as tolerated.  Remove Band-Aid on Saturday and then okay to shower.    AMBULATORY SURGERY  DISCHARGE INSTRUCTIONS   1) The drugs that you were given will stay in your system until tomorrow so for the next 24 hours you should not:  A) Drive an automobile B) Make any legal decisions C) Drink any alcoholic beverage   2) You may resume regular meals tomorrow.  Today it is better to start with liquids and gradually work up to solid foods.  You may eat anything you prefer, but it is better to start with liquids, then soup and crackers, and gradually work up to solid foods.   3) Please notify your doctor immediately if you have any unusual bleeding, trouble breathing, redness and pain at the surgery site, drainage, fever, or pain not relieved by medication.    4) Additional Instructions:   Dr. Rudene Christians is e-scribing your pain medication to [place].        Please contact your physician with any problems or Same Day Surgery at [phone], Monday through Friday 6 am to 4 pm, or [place] at [facility] number at [phone].

## 2018-08-08 NOTE — Anesthesia Post-op Follow-up Note (Signed)
Anesthesia QCDR form completed.        

## 2018-08-08 NOTE — OR Nursing (Signed)
MD in to see pt just prior to discharge.

## 2018-08-08 NOTE — Op Note (Signed)
08/08/2018  3:17 PM  PATIENT:  Joel Bryant  57 y.o. male  PRE-OPERATIVE DIAGNOSIS:  THORACIC COMPRESSION FRACTURE  POST-OPERATIVE DIAGNOSIS:  THORACIC COMPRESSION FRACTURE  PROCEDURE:  Procedure(s): KYPHOPLASTY-T12 (N/A)  SURGEON: Laurene Footman, MD  ASSISTANTS: None  ANESTHESIA:   local and MAC  EBL:  No intake/output data recorded.  BLOOD ADMINISTERED:none  DRAINS: none   LOCAL MEDICATIONS USED:  MARCAINE    and XYLOCAINE   SPECIMEN:  Source of Specimen:  T12  DISPOSITION OF SPECIMEN:  PATHOLOGY  COUNTS:  YES  TOURNIQUET:  * No tourniquets in log *  IMPLANTS: Bone cement  DICTATION: .Dragon Dictation patient was brought to the operating room and after adequate anesthesia was obtained the patient was placed prone.  C arm was brought in in good visualization of T12 was obtained in both AP and lateral projections.  After patient identification and timeout procedures were completed, 10 cc of 1% Xylocaine was infiltrated on the right side.  This skin was then prepped and draped in usual sterile manner and repeat timeout procedure carried out.  A spinal needle was then used to get down to the pedicle on the right side with a total of 20 cc half per Sensorcaine with epinephrine and 20 cc 1% Xylocaine infiltrated from the bone to the skin.  After allowing this to set a small incision was made and a trocar advanced in an extrapedicular fashion.  Care was taken to avoid the spinal canal as well as nerve root superior and inferiorly and after the vertebral body was entered a biopsy was obtained.  Drilling was carried out and the balloon inflated to 2 cc.  After the cement was the appropriate consistency approximately 6 cc of cement were used to get very good fill from left to right and inferior to superior through the vertebral body without extravasation.  After the cemented set the trochars removed and Dermabond was used for the skin followed by [place]: Discharge to  home after PACU  PATIENT DISPOSITION:  PACU - hemodynamically stable.

## 2018-08-08 NOTE — OR Nursing (Signed)
Pt advises he's out of pain meds at home.  Discharge pending MD out of OR.  Olean Ree, RN will notify MD of same.

## 2018-08-08 NOTE — H&P (Signed)
Reviewed paper H+P, will be scanned into chart. No changes noted.  

## 2018-08-08 NOTE — Anesthesia Preprocedure Evaluation (Signed)
Anesthesia Evaluation  Patient identified by MRN, date of birth, ID band Patient awake    Reviewed: Allergy & Precautions, H&P , NPO status , Patient's Chart, lab work & pertinent test results  History of Anesthesia Complications Negative for: history of anesthetic complications  Airway Mallampati: III  TM Distance: <3 FB Neck ROM: limited    Dental  (+) Chipped, Poor Dentition   Pulmonary neg shortness of breath, sleep apnea , former smoker,           Cardiovascular hypertension, (-) angina(-) Past MI and (-) DOE      Neuro/Psych PSYCHIATRIC DISORDERS  Neuromuscular disease    GI/Hepatic negative GI ROS, Neg liver ROS, GERD  Medicated and Controlled,  Endo/Other  diabetes, Type 2  Renal/GU negative Renal ROS  negative genitourinary   Musculoskeletal  (+) Arthritis ,   Abdominal   Peds  Hematology negative hematology ROS (+)   Anesthesia Other Findings Past Medical History: No date: Anxiety 06/13/2018: Aortic atherosclerosis ([place])     Comment:  July 2019 CT scan 09/13/2015: Chronic LBP 09/27/2015: Chronic left hip pain No date: Diabetes mellitus without complication (HCC) No date: Fracture, vertebra, pathologic No date: GERD (gastroesophageal reflux disease) No date: Hyperlipidemia No date: Hyperlipidemia No date: Hypertension No date: Hypomagnesemia  Past Surgical History: 1992: CATARACT EXTRACTION     Comment:  Insert Prosthetic Lens 02/28/2017: COLONOSCOPY WITH PROPOFOL; N/A     Comment:  Procedure: COLONOSCOPY WITH PROPOFOL;  Surgeon: Manya Silvas, MD;  Location: [facility] ENDOSCOPY;  Service:               Endoscopy;  Laterality: N/A; No date: EYE SURGERY 2001: LAMINECTOMY     Comment:  lumbar No date: MULTIPLE TOOTH EXTRACTIONS     Comment:  2018 No date: TIBIA FRACTURE SURGERY No date: TONSILLECTOMY No date: WRIST SURGERY; Right     Comment:  x2  BMI    Body Mass Index:  32.49  kg/m      Reproductive/Obstetrics negative OB ROS                             Anesthesia Physical Anesthesia Plan  ASA: III  Anesthesia Plan: General   Post-op Pain Management:    Induction: Intravenous  PONV Risk Score and Plan: Propofol infusion and TIVA  Airway Management Planned: Natural Airway and Nasal Cannula  Additional Equipment:   Intra-op Plan:   Post-operative Plan:   Informed Consent: I have reviewed the patients History and Physical, chart, labs and discussed the procedure including the risks, benefits and alternatives for the proposed anesthesia with the patient or authorized representative who has indicated his/her understanding and acceptance.   Dental Advisory Given  Plan Discussed with: Anesthesiologist, CRNA and Surgeon  Anesthesia Plan Comments: (Patient consented for risks of anesthesia including but not limited to:  - adverse reactions to medications - risk of intubation if required - damage to teeth, lips or other oral mucosa - sore throat or hoarseness - Damage to heart, brain, lungs or loss of life  Patient voiced understanding.)        Anesthesia Quick Evaluation

## 2018-08-08 NOTE — Transfer of Care (Signed)
Immediate Anesthesia Transfer of Care Note  Patient: Joel Bryant  Procedure(s) Performed: Procedure(s): KYPHOPLASTY-T12 (N/A)  Patient Location: PACU  Anesthesia Type:General  Level of Consciousness: alert  Airway & Oxygen Therapy: Patient Spontanous Breathing and Patient connected to face mask oxygen  Post-op Assessment: Report given to RN and Post -op Vital signs reviewed and stable  Post vital signs: Reviewed and stable  Last Vitals:  Vitals:   08/08/18 1325 08/08/18 1520  BP: 126/72 119/69  Pulse: (!) 57 (!) 59  Resp: 16 14  Temp: 36.9 C 36.6 C  SpO2: 01% 655%    Complications: No apparent anesthesia complications

## 2018-08-08 NOTE — OR Nursing (Signed)
Pain med escribed in to pt's pharmacy [place] peer Dr. Rudene Christians.  Patient notified of same.

## 2018-08-09 ENCOUNTER — Encounter: Payer: Self-pay | Admitting: Orthopedic Surgery

## 2018-08-09 ENCOUNTER — Ambulatory Visit: Payer: Medicare Other

## 2018-08-09 NOTE — Anesthesia Postprocedure Evaluation (Signed)
Anesthesia Post Note  Patient: Joel Bryant  Procedure(s) Performed: Coralyn Helling (N/A )  Patient location during evaluation: PACU Anesthesia Type: General Level of consciousness: awake and alert Pain management: pain level controlled Vital Signs Assessment: post-procedure vital signs reviewed and stable Respiratory status: spontaneous breathing, nonlabored ventilation, respiratory function stable and patient connected to nasal cannula oxygen Cardiovascular status: blood pressure returned to baseline and stable Postop Assessment: no apparent nausea or vomiting Anesthetic complications: no     Last Vitals:  Vitals:   08/08/18 1600 08/08/18 1652  BP: (!) 151/91 140/81  Pulse: (!) 56 60  Resp: 14 14  Temp: (!) 36.3 C   SpO2: 100% 100%    Last Pain:  Vitals:   08/08/18 1652  TempSrc:   PainSc: 3                  Precious Haws Noralyn Karim

## 2018-08-12 ENCOUNTER — Other Ambulatory Visit: Payer: Self-pay | Admitting: Family Medicine

## 2018-08-12 DIAGNOSIS — R600 Localized edema: Secondary | ICD-10-CM

## 2018-08-13 LAB — SURGICAL PATHOLOGY

## 2018-08-13 NOTE — Telephone Encounter (Signed)
I sent a note to pharmacist on sig for appt for further refills so we can discuss Limited Rx sent

## 2018-08-13 NOTE — Telephone Encounter (Signed)
Pt states he does not have CHF and doctor shah gave due to edema.  He states already wears compressions stocking and if you do not prescribe, he will find another docotor

## 2018-08-13 NOTE — Telephone Encounter (Signed)
Tried to call phone is busy

## 2018-08-13 NOTE — Telephone Encounter (Signed)
I don't see any echocardiogram or diagnosis of CHF Please contact patient Find out why he is on furosemide Does he have heart failure? If he has leg edema alone, then we'll ask him to wear compression stockings 18 mmHg and will be happy to refer him to vascular if any question of varicose veins or valve issues that may cause venous problems in his legs

## 2018-09-10 ENCOUNTER — Other Ambulatory Visit: Payer: Self-pay | Admitting: Family Medicine

## 2018-09-10 DIAGNOSIS — E1140 Type 2 diabetes mellitus with diabetic neuropathy, unspecified: Secondary | ICD-10-CM

## 2018-09-10 DIAGNOSIS — K219 Gastro-esophageal reflux disease without esophagitis: Secondary | ICD-10-CM

## 2018-09-10 DIAGNOSIS — E781 Pure hyperglyceridemia: Secondary | ICD-10-CM

## 2018-09-10 DIAGNOSIS — I10 Essential (primary) hypertension: Secondary | ICD-10-CM

## 2018-09-10 DIAGNOSIS — R600 Localized edema: Secondary | ICD-10-CM

## 2018-09-10 DIAGNOSIS — E785 Hyperlipidemia, unspecified: Secondary | ICD-10-CM

## 2018-09-10 NOTE — Telephone Encounter (Signed)
Routing back to [facility] 

## 2018-09-10 NOTE — Telephone Encounter (Signed)
Copied from [place] [id number]. Topic: Quick Communication - Rx Refill/Question >> Sep 10, 2018  3:47 PM Oneta Rack wrote: Medication: All medication with the exception of Trulicity   Has the patient contacted their pharmacy? Yes  (Agent: If yes, when and what did the pharmacy advise?) Advised to contact PCP office due to patient not having no refills.   Preferred Pharmacy (with phone number or street name): [facility] [place] [facility] [address]  [phone] (Phone) [phone] (Fax)   Agent: Please be advised that RX refills may take up to 3 business days. We ask that you follow-up with your pharmacy. ; Patient states he will run out and states he has a scheduled follow up with PCP for 08/16/18, patient aware of the 48 to 72 hour turn around time but would like prescriptions filled as soon as soon possible, please advise

## 2018-09-11 NOTE — Telephone Encounter (Signed)
Patient has an appt on 11/18

## 2018-09-12 ENCOUNTER — Ambulatory Visit: Payer: Self-pay | Admitting: Adult Health

## 2018-09-12 MED ORDER — ATORVASTATIN CALCIUM 20 MG PO TABS: 20.0000 mg | ORAL_TABLET | Freq: Every day | ORAL | 0 refills | Status: DC

## 2018-09-12 MED ORDER — ESOMEPRAZOLE MAGNESIUM 40 MG PO CPDR: 40.0000 mg | DELAYED_RELEASE_CAPSULE | Freq: Every day | ORAL | 0 refills | Status: DC

## 2018-09-12 MED ORDER — LISINOPRIL 2.5 MG PO TABS: 2.5000 mg | ORAL_TABLET | Freq: Every evening | ORAL | 0 refills | Status: DC

## 2018-09-12 MED ORDER — METFORMIN HCL 1000 MG PO TABS: 1000.0000 mg | ORAL_TABLET | Freq: Two times a day (BID) | ORAL | 0 refills | Status: DC

## 2018-09-12 MED ORDER — FUROSEMIDE 40 MG PO TABS: 40.0000 mg | ORAL_TABLET | Freq: Every day | ORAL | 0 refills | Status: DC | PRN

## 2018-09-12 MED ORDER — ATENOLOL 100 MG PO TABS: 100.0000 mg | ORAL_TABLET | Freq: Every day | ORAL | 0 refills | Status: DC

## 2018-09-12 MED ORDER — OMEGA-3-ACID ETHYL ESTERS 1 G PO CAPS: 2.0000 g | ORAL_CAPSULE | Freq: Two times a day (BID) | ORAL | 0 refills | Status: DC

## 2018-09-12 NOTE — Telephone Encounter (Signed)
Patient has an appt scheduled for Monday Last Cr and SGPT reviewed Rxs approved, limited furosemide as that should be sparingly, PRN, will discuss at appt

## 2018-09-16 ENCOUNTER — Encounter: Payer: Self-pay | Admitting: Family Medicine

## 2018-09-16 ENCOUNTER — Ambulatory Visit (INDEPENDENT_AMBULATORY_CARE_PROVIDER_SITE_OTHER): Payer: Medicare Other | Admitting: Family Medicine

## 2018-09-16 VITALS — BP 172/96 | HR 99 | Temp 98.7°F | Ht 69.0 in | Wt 233.5 lb

## 2018-09-16 DIAGNOSIS — M879 Osteonecrosis, unspecified: Secondary | ICD-10-CM

## 2018-09-16 DIAGNOSIS — M5442 Lumbago with sciatica, left side: Secondary | ICD-10-CM | POA: Diagnosis not present

## 2018-09-16 DIAGNOSIS — G8929 Other chronic pain: Secondary | ICD-10-CM

## 2018-09-16 DIAGNOSIS — M48061 Spinal stenosis, lumbar region without neurogenic claudication: Secondary | ICD-10-CM

## 2018-09-16 DIAGNOSIS — S22000S Wedge compression fracture of unspecified thoracic vertebra, sequela: Secondary | ICD-10-CM | POA: Diagnosis not present

## 2018-09-16 DIAGNOSIS — I10 Essential (primary) hypertension: Secondary | ICD-10-CM

## 2018-09-16 DIAGNOSIS — M5441 Lumbago with sciatica, right side: Secondary | ICD-10-CM

## 2018-09-16 DIAGNOSIS — M47816 Spondylosis without myelopathy or radiculopathy, lumbar region: Secondary | ICD-10-CM | POA: Diagnosis not present

## 2018-09-16 DIAGNOSIS — E119 Type 2 diabetes mellitus without complications: Secondary | ICD-10-CM

## 2018-09-16 DIAGNOSIS — R600 Localized edema: Secondary | ICD-10-CM

## 2018-09-16 MED ORDER — SITAGLIPTIN PHOSPHATE 50 MG PO TABS: 50.0000 mg | ORAL_TABLET | Freq: Every day | ORAL | 3 refills | Status: DC

## 2018-09-16 NOTE — Patient Instructions (Addendum)
Please call [phone] to schedule your echocardiogram Please wait 2-3 days after the order has been placed to call and get your echocardiogram scheduled  Start back on the blood pressure medicine today Return in 2-3 days to recheck that blood pressure Try to follow the DASH guidelines (DASH stands for Dietary Approaches to Stop Hypertension). Try to limit the sodium in your diet to no more than 1,500mg  of sodium per day. Certainly try to not exceed 2,000 mg per day at the very most. Do not add salt when cooking or at the table.  Check the sodium amount on labels when shopping, and choose items lower in sodium when given a choice. Avoid or limit foods that already contain a lot of sodium. Eat a diet rich in fruits and vegetables and whole grains, and try to lose weight if overweight or obese  Easy breathing  We'll have you see the [facility] If you have not heard anything from my staff in a week about any orders/referrals/studies from today, please contact us here to follow-up [phone]
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
## 2018-09-18 ENCOUNTER — Ambulatory Visit: Payer: Self-pay

## 2018-09-19 ENCOUNTER — Other Ambulatory Visit: Payer: Self-pay | Admitting: Family Medicine

## 2018-09-19 ENCOUNTER — Ambulatory Visit (INDEPENDENT_AMBULATORY_CARE_PROVIDER_SITE_OTHER): Payer: Medicare Other

## 2018-09-19 VITALS — BP 140/96 | HR 74

## 2018-09-19 DIAGNOSIS — E1140 Type 2 diabetes mellitus with diabetic neuropathy, unspecified: Secondary | ICD-10-CM

## 2018-09-19 DIAGNOSIS — I10 Essential (primary) hypertension: Secondary | ICD-10-CM | POA: Diagnosis not present

## 2018-09-19 DIAGNOSIS — Z23 Encounter for immunization: Secondary | ICD-10-CM | POA: Diagnosis not present

## 2018-09-19 MED ORDER — LISINOPRIL 2.5 MG PO TABS: 5.0000 mg | ORAL_TABLET | Freq: Every evening | ORAL | Status: DC

## 2018-09-19 NOTE — Patient Instructions (Signed)
Increase blood pressure med Lisinopril to 5mg  and continue current dose of Atenolol.  Please come back for blood pressure check on Monday December 2 with labs at that time.     Referral coordinator:  # Lenna Sciara [phone]

## 2018-09-19 NOTE — Addendum Note (Signed)
Addended by: Docia Furl on: 09/19/2018 10:16 AM   Modules accepted: Orders

## 2018-09-19 NOTE — Progress Notes (Signed)
Patient her for blood pressure check since starting back on Atenolol 100mg  and Lisinopril 2.5mg .  Blood pressure today is 140/96 and pulse 74.  I consulted with Dr. Sanda Klein and we will have patient increase Lisinopril to 5mg  and recheck blood pressure on Monday December 2.

## 2018-09-28 NOTE — Assessment & Plan Note (Signed)
Rx for Januvia; foot exam; next A1c in February Lab Results  Component Value Date   HGBA1C 5.5 06/13/2018

## 2018-09-28 NOTE — Assessment & Plan Note (Signed)
Start back on antihypertensive therapy; likely some rebound from abrupt cessation of beta-blocker; pain likely a factor; easy breathing encouraged, close f/u, DASH guidelines

## 2018-09-30 ENCOUNTER — Ambulatory Visit: Payer: Self-pay

## 2018-10-03 IMAGING — MR MR LUMBAR SPINE W/O CM
5 series · 33 of 48 positions shown · non-contrast
Comparison: CT abdomen and pelvis 05/22/2018.

CLINICAL DATA: Low back pain.  Fell in April 2017 with T12 fracture.

EXAM:
MRI LUMBAR SPINE WITHOUT CONTRAST
TECHNIQUE: Multiplanar, multisequence MR imaging of the lumbar spine was
performed. No intravenous contrast was administered.

[Series 5: T2 · sagittal · 4.0mm · 1.02mm/px · 6 of 16 slices shown (1 of 2)]
[im 1/16]
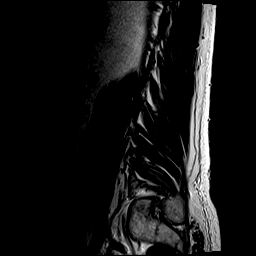
[im 4/16]
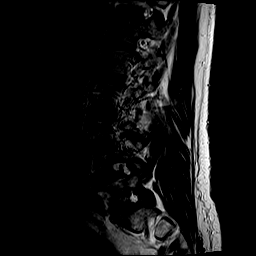
[im 7/16]
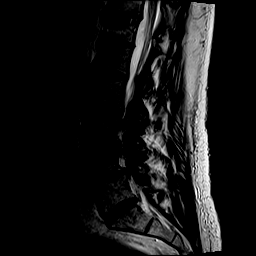
[im 10/16]
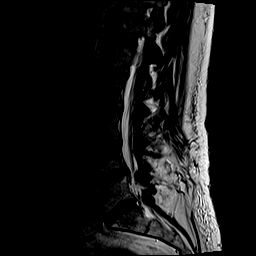
[im 13/16]
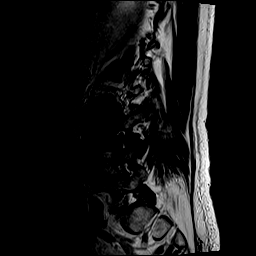
[im 16/16]
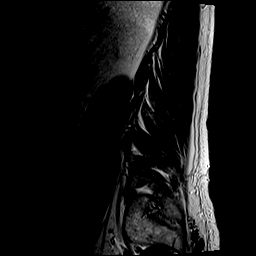

[Series 6: T1 · sagittal · 4.0mm · 1.02mm/px · 6 of 15 slices shown (1 of 2)]
[im 1/15]
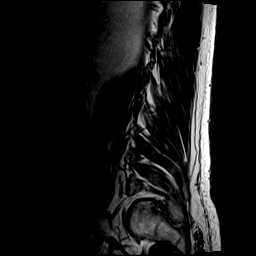
[im 3/15]
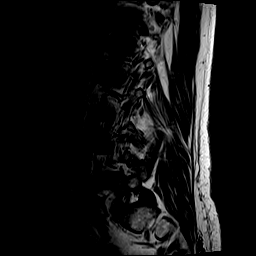
[im 6/15]
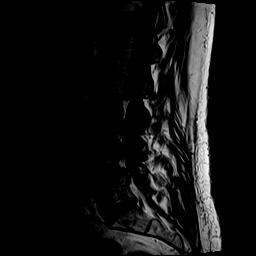
[im 9/15]
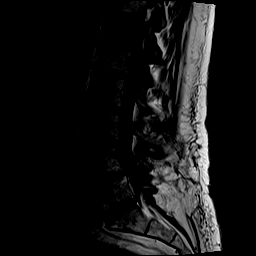
[im 12/15]
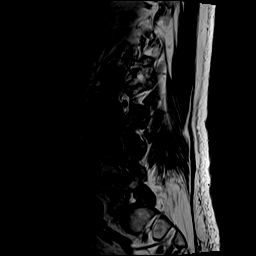
[im 15/15]
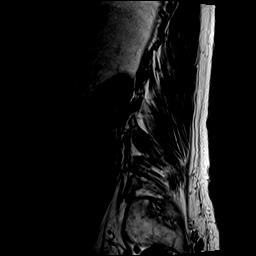

[Series 7: STIR · sagittal · 4.0mm · 0.51mm/px · 3 of 15 slices shown]
[im 1/15]
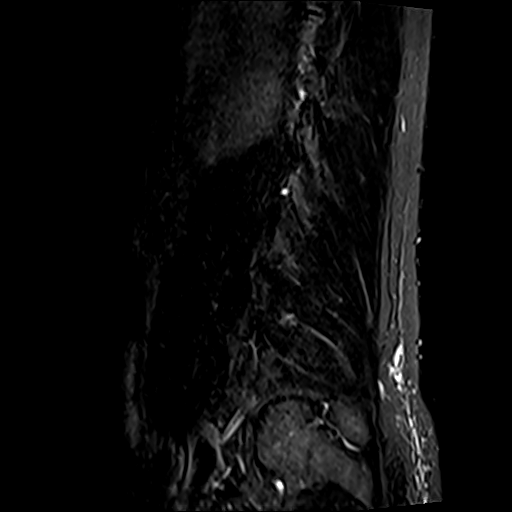
[im 3/15]
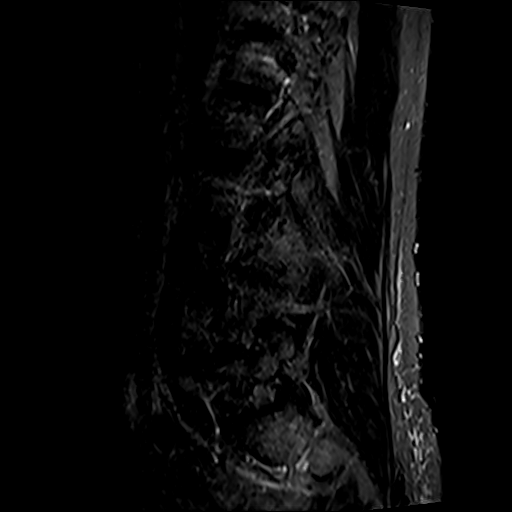
[im 6/15]
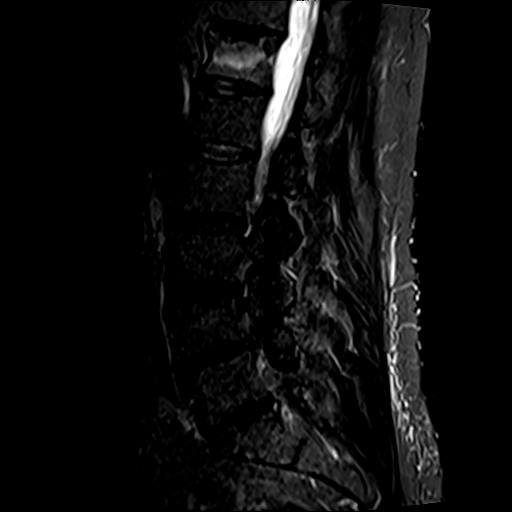

[Series 8: T2 · axial · 4.0mm · 0.78mm/px · z∈[-83,+156]mm · 9 of 41 slices shown (2 of 2)]
[im 1/41]
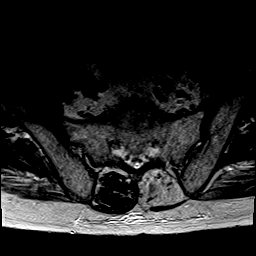
[im 6/41]
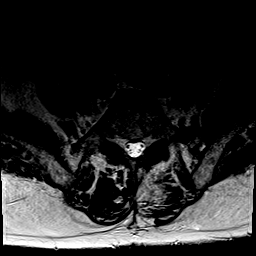
[im 12/41]
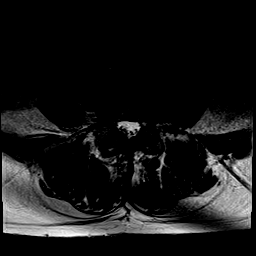
[im 18/41]
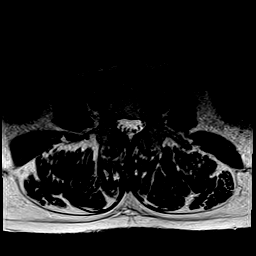
[im 21/41]
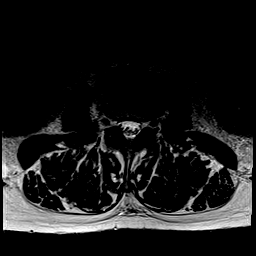
[im 23/41]
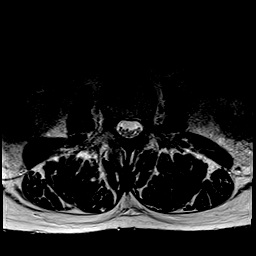
[im 29/41]
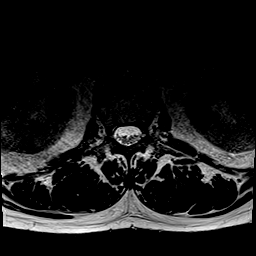
[im 35/41]
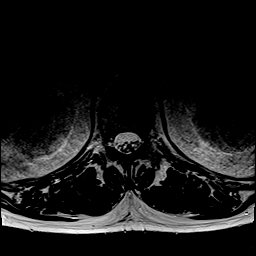
[im 41/41]
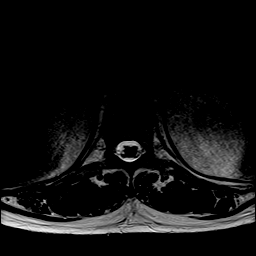

[Series 9: T1 · axial · 4.0mm · 0.39mm/px · z∈[-83,+156]mm · 9 of 41 slices shown (2 of 2)]
[im 1/41]
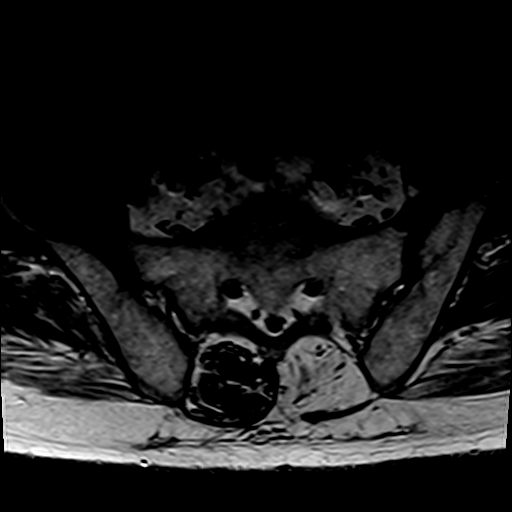
[im 6/41]
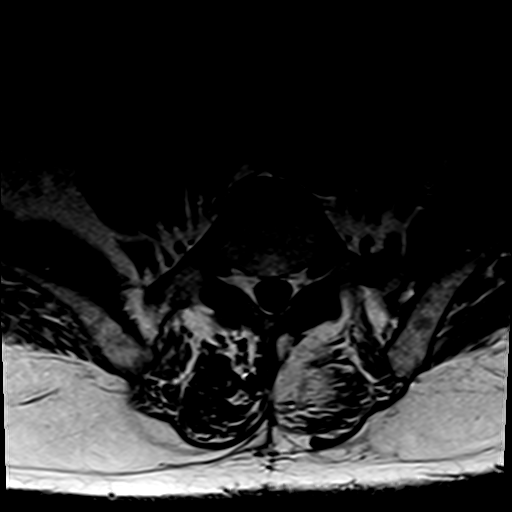
[im 12/41]
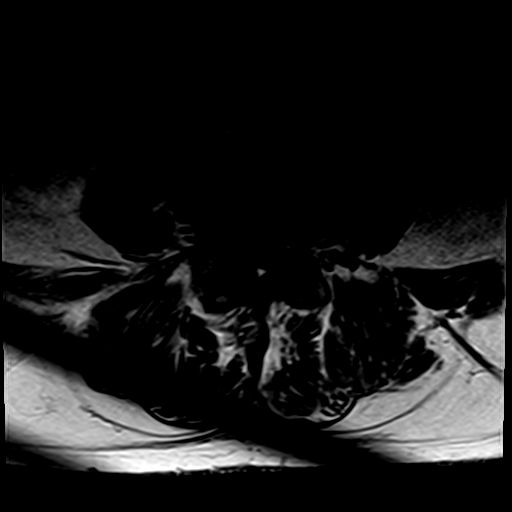
[im 18/41]
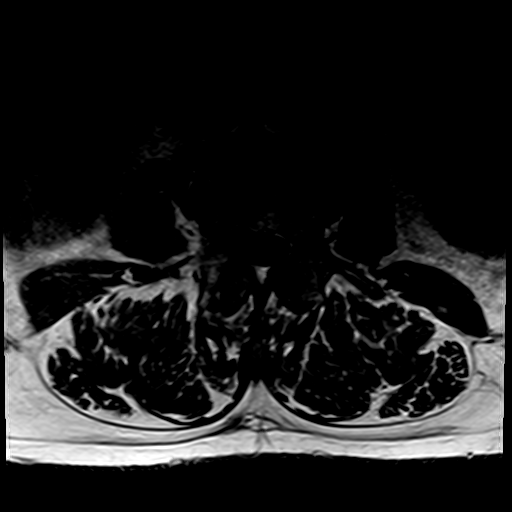
[im 21/41]
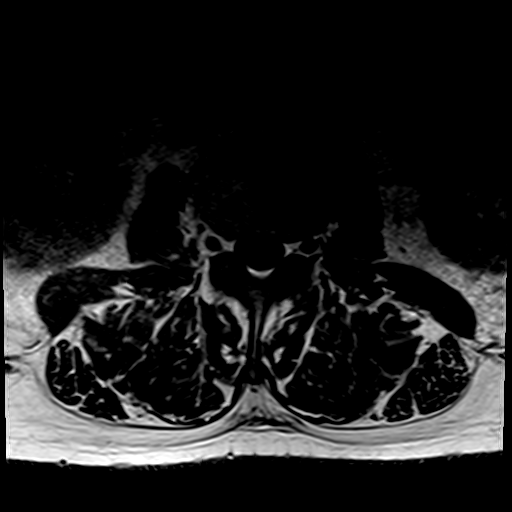
[im 23/41]
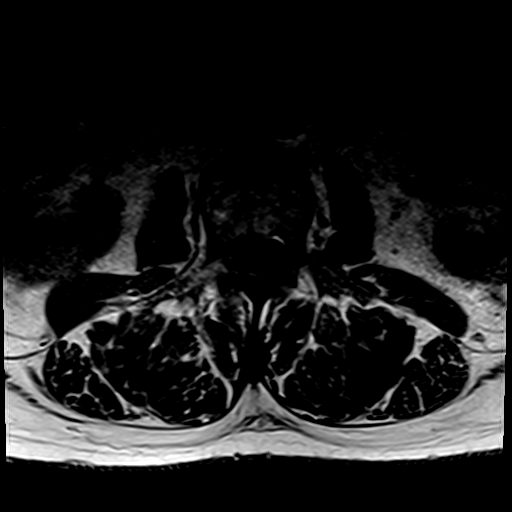
[im 29/41]
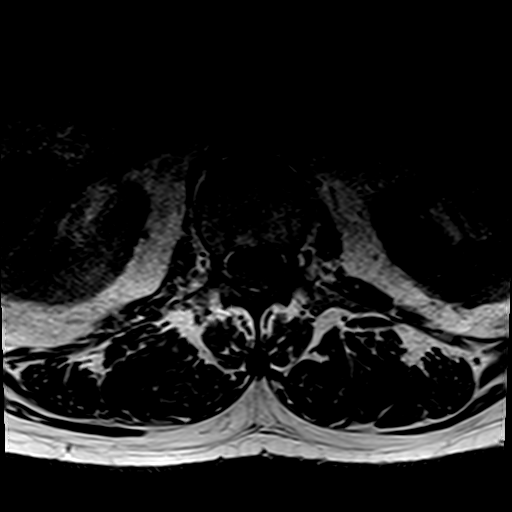
[im 35/41]
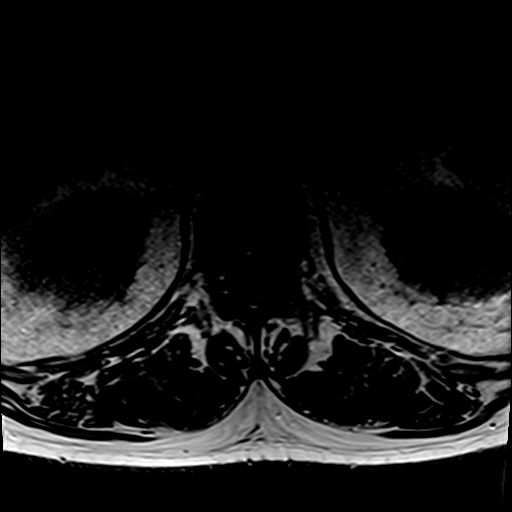
[im 41/41]
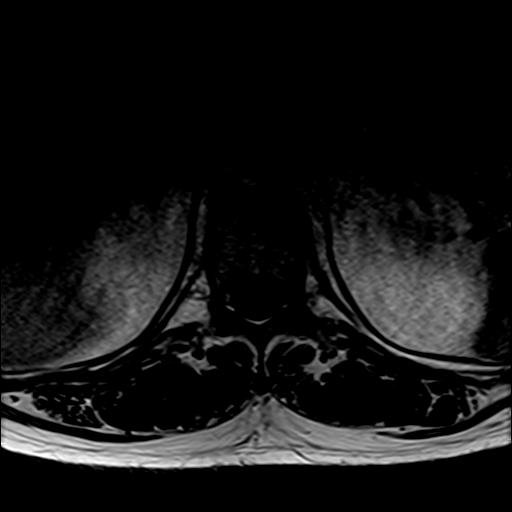

[33 of 48 positions shown; findings below may reference images not displayed]

FINDINGS: Segmentation:  Standard.

Alignment:  2 mm anterolisthesis L4-5.  Otherwise anatomic.

Vertebrae: Subacute compression fracture T12, slight anterior
wedging, superior endplate depression, T2 and STIR hyperintensity,
low T1 signal, indicating bone marrow edema. No involvement of the
pedicles or posterior elements. No retropulsion of bone at T12.

Conus medullaris and cauda equina: Conus extends to the T12-L1
level. Conus and cauda equina appear normal. No visible epidural
hematoma. No visible cord contusion or syrinx.

Paraspinal and other soft tissues: No paravertebral hematoma.

Disc levels:

T11-T12: The disc bulges mildly, there is facet hypertrophy, but no
impingement. Posterior aspect of T12 appears intact.

T12-L1: The disc bulges mildly and there is facet hypertrophy, but
no impingement.

L1-L2:  Normal.

L2-L3:  Normal disc space.  Mild facet arthropathy.  No impingement.

L3-L4: Annular bulge. Posterior element hypertrophy. No impingement.

L4-L5: Suspected prior LEFT laminectomy. Disc space narrowing with 2
mm anterolisthesis. Central and leftward protrusion with posterior
element hypertrophy. Mild stenosis. No definite L4 or L5 neural
impingement.

L5-S1: Disc desiccation. Shallow central and rightward protrusion.
Facet arthropathy. No definite impingement.
IMPRESSION: Subacute compression fracture of T12 appears to represent a benign
posttraumatic injury. Persistent and abnormal bone marrow edema is
consistent with ongoing low back pain.

No significant retropulsion of bone, epidural hematoma, conus
injury, adjacent stenosis, or further wedging compared with the
initial CT scan of 05/22/2018.

Mild multilevel spondylosis at L3-4, L4-5, and L5-S1 as described.

## 2018-10-09 ENCOUNTER — Other Ambulatory Visit: Payer: Self-pay | Admitting: Family Medicine

## 2018-10-09 DIAGNOSIS — R600 Localized edema: Secondary | ICD-10-CM

## 2018-11-28 ENCOUNTER — Other Ambulatory Visit: Payer: Self-pay | Admitting: Family Medicine

## 2018-11-28 DIAGNOSIS — K219 Gastro-esophageal reflux disease without esophagitis: Secondary | ICD-10-CM

## 2018-11-28 DIAGNOSIS — E781 Pure hyperglyceridemia: Secondary | ICD-10-CM

## 2018-11-28 DIAGNOSIS — I10 Essential (primary) hypertension: Secondary | ICD-10-CM

## 2018-11-28 DIAGNOSIS — E1140 Type 2 diabetes mellitus with diabetic neuropathy, unspecified: Secondary | ICD-10-CM

## 2018-12-17 ENCOUNTER — Ambulatory Visit: Payer: Self-pay | Admitting: Family Medicine

## 2020-03-20 ENCOUNTER — Emergency Department: Payer: Medicare Other

## 2020-03-20 ENCOUNTER — Emergency Department
Admission: EM | Admit: 2020-03-20 | Discharge: 2020-03-20 | Disposition: A | Discharge status: Home / Self Care | Payer: Medicare Other | Attending: Emergency Medicine | Admitting: Emergency Medicine

## 2020-03-20 ENCOUNTER — Encounter: Payer: Self-pay | Admitting: Emergency Medicine

## 2020-03-20 ENCOUNTER — Other Ambulatory Visit: Payer: Self-pay

## 2020-03-20 DIAGNOSIS — S60811A Abrasion of right wrist, initial encounter: Secondary | ICD-10-CM | POA: Diagnosis not present

## 2020-03-20 DIAGNOSIS — I10 Essential (primary) hypertension: Secondary | ICD-10-CM | POA: Diagnosis not present

## 2020-03-20 DIAGNOSIS — Y939 Activity, unspecified: Secondary | ICD-10-CM | POA: Diagnosis not present

## 2020-03-20 DIAGNOSIS — Z87891 Personal history of nicotine dependence: Secondary | ICD-10-CM | POA: Insufficient documentation

## 2020-03-20 DIAGNOSIS — Z7982 Long term (current) use of aspirin: Secondary | ICD-10-CM | POA: Diagnosis not present

## 2020-03-20 DIAGNOSIS — Y999 Unspecified external cause status: Secondary | ICD-10-CM | POA: Diagnosis not present

## 2020-03-20 DIAGNOSIS — E119 Type 2 diabetes mellitus without complications: Secondary | ICD-10-CM | POA: Insufficient documentation

## 2020-03-20 DIAGNOSIS — X500XXA Overexertion from strenuous movement or load, initial encounter: Secondary | ICD-10-CM | POA: Diagnosis not present

## 2020-03-20 DIAGNOSIS — Z7984 Long term (current) use of oral hypoglycemic drugs: Secondary | ICD-10-CM | POA: Insufficient documentation

## 2020-03-20 DIAGNOSIS — S60211A Contusion of right wrist, initial encounter: Secondary | ICD-10-CM

## 2020-03-20 DIAGNOSIS — Y92009 Unspecified place in unspecified non-institutional (private) residence as the place of occurrence of the external cause: Secondary | ICD-10-CM | POA: Diagnosis not present

## 2020-03-20 DIAGNOSIS — S6991XA Unspecified injury of right wrist, hand and finger(s), initial encounter: Secondary | ICD-10-CM | POA: Diagnosis present

## 2020-03-20 DIAGNOSIS — L089 Local infection of the skin and subcutaneous tissue, unspecified: Secondary | ICD-10-CM

## 2020-03-20 DIAGNOSIS — Z79899 Other long term (current) drug therapy: Secondary | ICD-10-CM | POA: Insufficient documentation

## 2020-03-20 MED ORDER — CEPHALEXIN 500 MG PO CAPS: 1000.0000 mg | ORAL_CAPSULE | Freq: Two times a day (BID) | ORAL | 0 refills | Status: DC

## 2020-03-20 NOTE — ED Triage Notes (Signed)
First nurse note- pt here for finger/wrist injury.  Legally blind. Declined wheelchair.

## 2020-03-20 NOTE — ED Provider Notes (Signed)
[facility] Emergency Department Provider Note  ____________________________________________  Time seen: Approximately 9:37 PM  I have reviewed the triage vital signs and the nursing notes.   HISTORY  Chief Complaint Wrist Pain    HPI Joel Bryant is a 59 y.o. male who presents the emergency department complaining of right wrist pain.  Patient states that he is remodeling his house, yesterday he was moving something and has a swelling around the corner of the counter struck at the distal aspect of his ulna.  Patient has a history of significant surgeries to the right wrist in the past and was concerned that he may have broken his wrist.  Patient arrived wearing a Velcro wrist brace.  Patient denied any other injury or complaint at this time.         Past Medical History:  Diagnosis Date  . Anxiety   . Aortic atherosclerosis ([place]) 06/13/2018   July 2019 CT scan  . Chronic LBP 09/13/2015  . Chronic left hip pain 09/27/2015  . Diabetes mellitus without complication ([place])   . Fracture, vertebra, pathologic   . GERD (gastroesophageal reflux disease)   . Hyperlipidemia   . Hyperlipidemia   . Hypertension   . Hypomagnesemia     Patient Active Problem List   Diagnosis Date Noted  . Obesity (BMI 30.0-34.9) 06/13/2018  . Closed compression fracture of thoracic vertebra ([place]) 06/13/2018  . Fatty liver 06/13/2018  . Aortic atherosclerosis ([place]) 06/13/2018  . Chronic pain syndrome 09/06/2016  . Diabetes mellitus, type 2 ([place]) 09/27/2015  . Can't get food down 09/27/2015  . H/O adenomatous polyp of colon 09/27/2015  . Leg swelling 09/27/2015  . Dysmetabolic syndrome [id number]  . Adiposity 09/27/2015  . Pneumococcal vaccination given 09/27/2015  . Post Laminectomy Syndrome (L4-5 Hemilaminectomy and partial Facetectomy) 09/27/2015  . Dietary counseling and surveillance 09/27/2015  . Long term current use of opiate analgesic 09/27/2015  . Long term  prescription opiate use 09/27/2015  . Opiate use (30 MME/Day) 09/27/2015  . Encounter for therapeutic drug level monitoring 09/27/2015  . Encounter for chronic pain management 09/27/2015  . Lumbar spondylosis 09/27/2015  . 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 Epidural fibrosis 09/27/2015  . Chronic low back pain (Location of Primary Source of Pain) (Bilateral) (L>R) 09/27/2015  . Hypomagnesemia 09/27/2015  . Encounter for long-term (current) use of other high-risk medications 09/27/2015  . Encounter for opiate analgesic use agreement 09/27/2015  . Opioid use agreement exists 09/27/2015  . Chronic left knee pain 09/27/2015  . Lumbar spinal stenosis (L4-5) 09/27/2015  . Lumbar foraminal stenosis (Bilateral L3-4 and L4-5) 09/27/2015  . Lumbar facet hypertrophy (Bilateral, Multilevel) 09/27/2015  . Lumbar facet syndrome (Bilateral) 09/27/2015  . 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 Chronic hip pain (Location of Secondary source of pain) (Bilateral) (L>R) (secondary to osteonecrosis) 09/27/2015  . Edema leg 09/13/2015  . Gout 09/13/2015  . Obstructive apnea 09/13/2015  . Anxiety disorder 06/11/2015  . Hypertriglyceridemia 06/11/2015  . BP (high blood pressure) 06/11/2015  . Dyslipidemia 06/11/2015  . Controlled type 2 diabetes mellitus with diabetic neuropathy, without long-term current use of insulin ([place]) 06/11/2015  . Brash 06/11/2015  . 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 Retinitis pigmentosa 06/11/2015    Past Surgical History:  Procedure Laterality Date  . CATARACT EXTRACTION  1992   Insert Prosthetic Lens  . COLONOSCOPY WITH PROPOFOL N/A 02/28/2017   Procedure: COLONOSCOPY WITH PROPOFOL;  Surgeon: Manya Silvas, MD;  Location: [facility] ENDOSCOPY;  Service: Endoscopy;  Laterality: N/A;  . EYE SURGERY    . KYPHOPLASTY N/A 08/08/2018   Procedure: [id number];  Surgeon: Hessie Knows, MD;  Location: [facility] ORS;  Service:  Orthopedics;  Laterality: N/A;  . LAMINECTOMY  2001   lumbar  . MULTIPLE TOOTH EXTRACTIONS     2018  . TIBIA FRACTURE SURGERY    . TONSILLECTOMY    . WRIST SURGERY Right    x2    Prior to Admission medications   Medication Sig Start Date End Date Taking? Authorizing Provider  ACCU-CHEK SOFTCLIX LANCETS lancets USE AS DIRECTED TO CHECK BLOOD SUGAR TWICE A DAY 06/28/17   Roselee Nova, MD  aspirin 81 MG tablet Take 81 mg by mouth every evening.     [provider]  atenolol (TENORMIN) 100 MG tablet Take 1 tablet (100 mg total) by mouth daily. 09/12/18   Arnetha Courser, MD  atorvastatin (LIPITOR) 20 MG tablet Take 1 tablet (20 mg total) by mouth daily at 6 PM. 09/12/18   Lada, Satira Anis, MD  baclofen (LIORESAL) 20 MG tablet Take 20 mg by mouth 3 (three) times daily.    [provider]  cephALEXin (KEFLEX) 500 MG capsule Take 2 capsules (1,000 mg total) by mouth 2 (two) times daily. 03/20/20   Alexande Sheerin, Charline Bills, PA-C  diphenhydramine-acetaminophen (TYLENOL PM) 25-500 MG TABS tablet Take 1-2 tablets by mouth at bedtime as needed (sleep).     [provider]  esomeprazole (NEXIUM) 40 MG capsule Take 1 capsule (40 mg total) by mouth daily. 09/12/18   Arnetha Courser, MD  furosemide (LASIX) 40 MG tablet Take 1 tablet (40 mg total) by mouth daily as needed for fluid. Patient not taking: Reported on 09/16/2018 09/12/18   Arnetha Courser, MD  gabapentin (NEURONTIN) 300 MG capsule Take 300 mg by mouth 3 (three) times daily.    [provider]  ibuprofen (ADVIL,MOTRIN) 200 MG tablet Take 600-800 mg by mouth every 8 (eight) hours as needed (headaches.).    [provider]  lidocaine (LIDODERM) 5 % Place 1 patch onto the skin daily. Remove & Discard patch within 12 hours or as directed by MD Patient not taking: Reported on 09/16/2018 07/25/18   Laban Emperor, PA-C  lisinopril (PRINIVIL,ZESTRIL) 2.5 MG tablet Take 2 tablets (5 mg total) by mouth every  evening. 09/19/18   Arnetha Courser, MD  metFORMIN (GLUCOPHAGE) 1000 MG tablet Take 1 tablet (1,000 mg total) by mouth 2 (two) times daily. 09/12/18   Arnetha Courser, MD  omega-3 acid ethyl esters (LOVAZA) 1 g capsule Take 2 capsules (2 g total) by mouth 2 (two) times daily. 09/12/18   Lada, Satira Anis, MD  sitaGLIPtin (JANUVIA) 50 MG tablet Take 1 tablet (50 mg total) by mouth daily. 09/16/18   Arnetha Courser, MD    Allergies Patient has no known allergies.  Family History  Problem Relation Age of Onset  . Colon polyps Mother   . Retinitis pigmentosa Mother   . Retinitis pigmentosa Maternal Grandmother   . Cancer Maternal Grandfather   . Diabetes Paternal Grandmother   . Alcohol abuse Father     Social History Social History   Tobacco Use  . Smoking status: Former Smoker    Types: Cigarettes  . Smokeless tobacco: Never Used  . Tobacco comment: Quit smoking 4 years now  Substance Use Topics  . Alcohol use: Yes    Alcohol/week: 0.0 standard drinks  Comment: 10 beer today  . Drug use: No    Comment: in past used marijuana      Review of Systems  Constitutional: No fever/chills Eyes: No visual changes. No discharge ENT: No upper respiratory complaints. Cardiovascular: no chest pain. Respiratory: no cough. No SOB. Gastrointestinal: No abdominal pain.  No nausea, no vomiting.  No diarrhea.  No constipation. Musculoskeletal: Positive for right wrist injury Skin: Negative for rash, abrasions, lacerations, ecchymosis. Neurological: Negative for headaches, focal weakness or numbness. 10-point ROS otherwise negative.  ____________________________________________   PHYSICAL EXAM:  VITAL SIGNS: ED Triage Vitals  Enc Vitals Group     BP 03/20/20 1833 (!) 198/95     Pulse Rate 03/20/20 1833 93     Resp 03/20/20 1833 18     Temp 03/20/20 1833 99.1 F (37.3 C)     Temp Source 03/20/20 1833 Oral     SpO2 03/20/20 1833 98 %     Weight --      Height --      Head  Circumference --      Peak Flow --      Pain Score 03/20/20 1843 8     Pain Loc --      Pain Edu? --      Excl. in [place]? --      Constitutional: Alert and oriented. Well appearing and in no acute distress. Eyes: Conjunctivae are normal. PERRL. EOMI. Head: Atraumatic. ENT:      Ears:       Nose: No congestion/rhinnorhea.      Mouth/Throat: Mucous membranes are moist.  Neck: No stridor.    Cardiovascular: Normal rate, regular rhythm. Normal S1 and S2.  Good peripheral circulation. Respiratory: Normal respiratory effort without tachypnea or retractions. Lungs CTAB. Good air entry to the bases with no decreased or absent breath sounds. Musculoskeletal: Full range of motion to all extremities. No gross deformities appreciated.  Wrist is initially immobilized using Velcro wrist brace.  This is removed.  Patient has a relatively superficial soft tissue wound along the medial aspect of the wrist.  Patient states that this is several days old.  There is some honey colored crusting with mild increased erythema along the distal aspect of this injury.  Otherwise no deformity, ecchymosis, or other signs of trauma to the wrist.  Patient is able to move the wrist appropriately at this time.  Sensation intact all digits.  Radial pulse intact. Neurologic:  Normal speech and language. No gross focal neurologic deficits are appreciated.  Skin:  Skin is warm, dry and intact. No rash noted. Psychiatric: Mood and affect are normal. Speech and behavior are normal. Patient exhibits appropriate insight and judgement.   ____________________________________________   LABS (all labs ordered are listed, but only abnormal results are displayed)  Labs Reviewed - No data to display ____________________________________________  EKG   ____________________________________________  RADIOLOGY I personally viewed and evaluated these images as part of my medical decision making, as well as reviewing the written report  by the radiologist.  DG Hand Complete Right  Result Date: 03/20/2020 CLINICAL DATA:  Acute pain due to trauma EXAM: RIGHT HAND - COMPLETE 3+ VIEW COMPARISON:  None. FINDINGS: There is no evidence of fracture or dislocation. There is no evidence of arthropathy or other focal bone abnormality. Soft tissues are unremarkable. IMPRESSION: Negative. Electronically Signed   By: Constance Holster M.D.   On: 03/20/2020 20:33    ____________________________________________    PROCEDURES  Procedure(s) performed:  Procedures    Medications - No data to display   ____________________________________________   INITIAL IMPRESSION / ASSESSMENT AND PLAN / ED COURSE  Pertinent labs & imaging results that were available during my care of the patient were reviewed by me and considered in my medical decision making (see chart for details).  Review of the [place] CSRS was performed in accordance of the [place] prior to dispensing any controlled drugs.           Patient's diagnosis is consistent with contusion of the wrist, infected abrasion.  Patient presented to emergency department after striking his wrist on a counter yesterday.  Patient has had multiple surgeries to the wrist and went to ensure there was no fracture.  Imaging reveals no evidence of acute osseous injury.  Visualization of the wrist reveals superficial wound to the medial aspect.  Patient states that this occurred several days ago.  Visualization show signs of beginning cellulitis.  Patient be placed on Keflex for same.  Motrin at home for symptom relief of contusion of the wrist.  Patient can continue use of his Velcro wrist brace.  Follow-up with primary care or orthopedics as needed..  Patient is given ED precautions to return to the ED for any worsening or new symptoms.     ____________________________________________  FINAL CLINICAL IMPRESSION(S) / ED DIAGNOSES  Final diagnoses:  Contusion of right wrist, initial encounter   Infected abrasion of right wrist, initial encounter      NEW MEDICATIONS STARTED DURING THIS VISIT:  ED Discharge Orders         Ordered    cephALEXin (KEFLEX) 500 MG capsule  2 times daily     03/20/20 2145              This chart was dictated using voice recognition software/Dragon. Despite best efforts to proofread, errors can occur which can change the meaning. Any change was purely unintentional.    Brynda Peon 03/20/20 2147    Nance Pear, MD 03/20/20 [phone]

## 2020-03-20 NOTE — ED Triage Notes (Signed)
Pt to ED via POV c/o right wrist pain and right pink pain. Pt admits that he drink 10 beers today. Pt is in NAD.

## 2020-03-20 NOTE — ED Notes (Addendum)
Patient has friend at bedside to drive him home. Patient has a strong odor of ETOH. Patient has a steady gait.

## 2020-03-20 NOTE — ED Notes (Signed)
Right wrist and hand are covered with velcro splint. Will defer physical exam to PA-C.

## 2020-04-08 DIAGNOSIS — S60811A Abrasion of right wrist, initial encounter: Secondary | ICD-10-CM | POA: Diagnosis not present

## 2020-04-08 DIAGNOSIS — S60211A Contusion of right wrist, initial encounter: Secondary | ICD-10-CM | POA: Diagnosis not present

## 2020-04-14 DIAGNOSIS — E7800 Pure hypercholesterolemia, unspecified: Secondary | ICD-10-CM | POA: Diagnosis not present

## 2020-04-14 DIAGNOSIS — I10 Essential (primary) hypertension: Secondary | ICD-10-CM | POA: Diagnosis not present

## 2020-04-14 DIAGNOSIS — Z789 Other specified health status: Secondary | ICD-10-CM | POA: Diagnosis not present

## 2020-04-22 DIAGNOSIS — S63501A Unspecified sprain of right wrist, initial encounter: Secondary | ICD-10-CM | POA: Diagnosis not present

## 2020-04-30 ENCOUNTER — Encounter: Payer: Self-pay | Admitting: Family Medicine

## 2020-04-30 ENCOUNTER — Other Ambulatory Visit: Payer: Self-pay

## 2020-04-30 ENCOUNTER — Ambulatory Visit (INDEPENDENT_AMBULATORY_CARE_PROVIDER_SITE_OTHER): Payer: Medicare Other | Admitting: Family Medicine

## 2020-04-30 VITALS — BP 122/76 | HR 54 | Temp 97.8°F | Resp 16 | Ht 69.0 in | Wt 203.1 lb

## 2020-04-30 DIAGNOSIS — I10 Essential (primary) hypertension: Secondary | ICD-10-CM

## 2020-04-30 DIAGNOSIS — H3552 Pigmentary retinal dystrophy: Secondary | ICD-10-CM

## 2020-04-30 DIAGNOSIS — E1140 Type 2 diabetes mellitus with diabetic neuropathy, unspecified: Secondary | ICD-10-CM

## 2020-04-30 DIAGNOSIS — I700 Atherosclerosis of aorta: Secondary | ICD-10-CM

## 2020-04-30 DIAGNOSIS — F1911 Other psychoactive substance abuse, in remission: Secondary | ICD-10-CM

## 2020-04-30 DIAGNOSIS — R600 Localized edema: Secondary | ICD-10-CM

## 2020-04-30 DIAGNOSIS — M879 Osteonecrosis, unspecified: Secondary | ICD-10-CM

## 2020-04-30 DIAGNOSIS — E1159 Type 2 diabetes mellitus with other circulatory complications: Secondary | ICD-10-CM

## 2020-04-30 DIAGNOSIS — G894 Chronic pain syndrome: Secondary | ICD-10-CM

## 2020-04-30 DIAGNOSIS — I152 Hypertension secondary to endocrine disorders: Secondary | ICD-10-CM

## 2020-04-30 DIAGNOSIS — H548 Legal blindness, as defined in USA: Secondary | ICD-10-CM

## 2020-04-30 DIAGNOSIS — G4733 Obstructive sleep apnea (adult) (pediatric): Secondary | ICD-10-CM | POA: Diagnosis not present

## 2020-04-30 DIAGNOSIS — E785 Hyperlipidemia, unspecified: Secondary | ICD-10-CM

## 2020-04-30 LAB — POCT GLYCOSYLATED HEMOGLOBIN (HGB A1C): Hemoglobin A1C: 5.1 % (ref 4.0–5.6)

## 2020-04-30 MED ORDER — GABAPENTIN 100 MG PO CAPS: 100.0000 mg | ORAL_CAPSULE | Freq: Three times a day (TID) | ORAL | 0 refills | Status: DC

## 2020-04-30 MED ORDER — METAXALONE 800 MG PO TABS
800.0000 mg | ORAL_TABLET | Freq: Three times a day (TID) | ORAL | 0 refills | Status: DC
Start: 2020-04-30 — End: 2020-09-03

## 2020-04-30 MED ORDER — FUROSEMIDE 40 MG PO TABS: 40.0000 mg | ORAL_TABLET | Freq: Every day | ORAL | 0 refills | Status: DC | PRN

## 2020-04-30 MED ORDER — ATENOLOL 50 MG PO TABS: 50.0000 mg | ORAL_TABLET | Freq: Every day | ORAL | 0 refills | Status: DC

## 2020-04-30 MED ORDER — METFORMIN HCL 500 MG PO TABS: 500.0000 mg | ORAL_TABLET | Freq: Every day | ORAL | 0 refills | Status: DC

## 2020-04-30 NOTE — Progress Notes (Signed)
Name: Joel Bryant   MRN: [id number]    DOB: 10/07/1961   Date:04/30/2020       Progress Note  Subjective  Chief Complaint  Chief Complaint  Patient presents with  . Diabetes  . Hypertension  . Back Pain    HPI  DMII: diagnosed years ago, he states he has been taking metformin  1000 mg BID , denies polyphagia, polyuria or polydipsia . He has lost 50 lbs in the past 6 months because he stopped drinking sodas and avoids sweets and junk food. He has a history of obesity. He left our practice in 2019 and was seeing Dr. Nancy Fetter but decided to come back. Today A1C is 5.1 %. We will decrease dose of Metformin, he states recently had lipid panel checked and we will get records from previous PCP. He also has neuropathy, currently off gabapentin but willing to resume medications  Aorta Atherosclerosis:found  on CT abdomen done 05/22/2018 , he is taking statin and aspirin,   History of drug abuse: he states he has used all kinds of drugs, including cocaine, heroine, but has been clean for over 2 years. He states cannot go to the pain clinic because of his history   Chronic back pain, failed back surgery, also has Osteonecrosis of hips and chronic hip and knee pain: he has difficulty sleeping well, has to shift positions constantly and pain is 8/10. He states back spasms are bothersome. No bowel or bladder incontinence, he states baclofen made him feel loopy but would like to try another muscle relaxer, explained we can try gabapentin and titrate up slowly. He agrees.   HTN: taking bp medication, heart rate is low, bp towards low end of normal, we will decrease dose of Atenolol and monitor. He denies chest pain or palpitation   Patient Active Problem List   Diagnosis Date Noted  . History of drug abuse in remission ([place]) 04/30/2020  . Closed compression fracture of thoracic vertebra ([place]) 06/13/2018  . Fatty liver 06/13/2018  . Aortic atherosclerosis ([place]) 06/13/2018  . Chronic pain syndrome  09/06/2016  . Dyslipidemia associated with type 2 diabetes mellitus ([place]) 09/27/2015  . H/O adenomatous polyp of colon 09/27/2015  . Dysmetabolic syndrome 23/55/7322  . Post Laminectomy Syndrome (L4-5 Hemilaminectomy and partial Facetectomy) 09/27/2015  . Lumbar spondylosis 09/27/2015  . 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 Epidural fibrosis 09/27/2015  . Chronic low back pain (Location of Primary Source of Pain) (Bilateral) (L>R) 09/27/2015  . Chronic left knee pain 09/27/2015  . Lumbar spinal stenosis (L4-5) 09/27/2015  . Lumbar foraminal stenosis (Bilateral L3-4 and L4-5) 09/27/2015  . Lumbar facet hypertrophy (Bilateral, Multilevel) 09/27/2015  . Lumbar facet syndrome (Bilateral) 09/27/2015  . Bilateral hip osteonecrosis ([place]) (Bilateral Femoral Head) 09/27/2015  . Chronic hip pain (Location of Secondary source of pain) (Bilateral) (L>R) (secondary to osteonecrosis) 09/27/2015  . Edema leg 09/13/2015  . Gout 09/13/2015  . Obstructive apnea 09/13/2015  . Anxiety disorder 06/11/2015  . Hypertriglyceridemia 06/11/2015  . Dyslipidemia 06/11/2015  . Controlled type 2 diabetes mellitus with diabetic neuropathy, without long-term current use of insulin ([place]) 06/11/2015  . 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 Retinitis pigmentosa 06/11/2015    Past Surgical History:  Procedure Laterality Date  . CATARACT EXTRACTION  1992   Insert Prosthetic Lens  . COLONOSCOPY WITH PROPOFOL N/A 02/28/2017   Procedure: COLONOSCOPY WITH PROPOFOL;  Surgeon: Manya Silvas, MD;  Location: [facility] ENDOSCOPY;  Service: Endoscopy;  Laterality: N/A;  . EYE  SURGERY    . KYPHOPLASTY N/A 08/08/2018   Procedure: [id number];  Surgeon: Hessie Knows, MD;  Location: [facility] ORS;  Service: Orthopedics;  Laterality: N/A;  . LAMINECTOMY  2001   lumbar  . MULTIPLE TOOTH EXTRACTIONS     2018  . TIBIA FRACTURE SURGERY    . TONSILLECTOMY    . WRIST SURGERY Right    x2    Family History  Problem  Relation Age of Onset  . Colon polyps Mother   . Retinitis pigmentosa Mother   . Retinitis pigmentosa Maternal Grandmother   . Cancer Maternal Grandfather   . Diabetes Paternal Grandmother   . Alcohol abuse Father     Social History   Tobacco Use  . Smoking status: Former Smoker    Types: Cigarettes  . Smokeless tobacco: Never Used  . Tobacco comment: Quit smoking 4 years now  Substance Use Topics  . Alcohol use: Yes    Alcohol/week: 0.0 standard drinks    Comment: 10 beer today     Current Outpatient Medications:  .  ACCU-CHEK SOFTCLIX LANCETS lancets, USE AS DIRECTED TO CHECK BLOOD SUGAR TWICE A DAY, Disp: 100 each, Rfl: 2 .  aspirin 81 MG tablet, Take 81 mg by mouth every evening. , Disp: , Rfl:  .  atenolol (TENORMIN) 50 MG tablet, Take 1 tablet (50 mg total) by mouth daily., Disp: 90 tablet, Rfl: 0 .  atorvastatin (LIPITOR) 20 MG tablet, Take 1 tablet (20 mg total) by mouth daily at 6 PM., Disp: 90 tablet, Rfl: 0 .  diphenhydramine-acetaminophen (TYLENOL PM) 25-500 MG TABS tablet, Take 1-2 tablets by mouth at bedtime as needed (sleep). , Disp: , Rfl:  .  furosemide (LASIX) 40 MG tablet, Take 1 tablet (40 mg total) by mouth daily as needed for fluid., Disp: 90 tablet, Rfl: 0 .  lisinopril (PRINIVIL,ZESTRIL) 2.5 MG tablet, Take 2 tablets (5 mg total) by mouth every evening., Disp: , Rfl:  .  metFORMIN (GLUCOPHAGE) 500 MG tablet, Take 1 tablet (500 mg total) by mouth daily with breakfast., Disp: 90 tablet, Rfl: 0 .  omega-3 acid ethyl esters (LOVAZA) 1 g capsule, Take 2 capsules (2 g total) by mouth 2 (two) times daily., Disp: 360 capsule, Rfl: 0 .  gabapentin (NEURONTIN) 100 MG capsule, Take 1 capsule (100 mg total) by mouth 3 (three) times daily., Disp: 90 capsule, Rfl: 0 .  metaxalone (SKELAXIN) 800 MG tablet, Take 1 tablet (800 mg total) by mouth 3 (three) times daily., Disp: 90 tablet, Rfl: 0  No Known Allergies  I personally reviewed active problem list, medication list,  allergies, family history, social history, health maintenance with the patient/caregiver today.   ROS  Constitutional: Negative for fever , positive for  weight change.  Respiratory: Negative for cough and shortness of breath.   Cardiovascular: Negative for chest pain or palpitations.  Gastrointestinal: Negative for abdominal pain, no bowel changes.  Musculoskeletal: Positive  for gait problem but no  joint swelling.  Skin: Negative for rash.  Neurological: Negative for dizziness or headache.  No other specific complaints in a complete review of systems (except as listed in HPI above).  Objective  Vitals:   04/30/20 1156  BP: 122/76  Pulse: (!) 54  Resp: 16  Temp: 97.8 F (36.6 C)  TempSrc: Temporal  SpO2: 96%  Weight: 203 lb 1.6 oz (92.1 kg)  Height: 5\' 9"  (1.753 m)    Body mass index is 29.99 kg/m.  Physical Exam  Constitutional: Patient appears  well-developed and well-nourished. Overweight.  No distress.  HEENT: head atraumatic, normocephalic, pupils equal and reactive to light,  neck supple Cardiovascular: Normal rate, regular rhythm and normal heart sounds.  No murmur heard. No BLE edema. Pulmonary/Chest: Effort normal and breath sounds normal. No respiratory distress. Abdominal: Soft.  There is no tenderness. Muscular Skeletal: he used a cane to come in, standing with legs far apart, did not sit down, spine tender to palpation lumbar area, decrease rom, pain with internal rotation of both hips  Psychiatric: Patient has a normal mood and affect. behavior is normal. Judgment and thought content normal.  Recent Results (from the past 2160 hour(s))  POCT HgB A1C     Status: Normal   Collection Time: 04/30/20 12:05 PM  Result Value Ref Range   Hemoglobin A1C 5.1 4.0 - 5.6 %   HbA1c POC (<> result, manual entry)     HbA1c, POC (prediabetic range)     HbA1c, POC (controlled diabetic range)      Diabetic Foot Exam: Diabetic Foot Exam - Simple   Simple Foot  Form Visual Inspection No deformities, no ulcerations, no other skin breakdown bilaterally: Yes Sensation Testing See comments: Yes Pulse Check Posterior Tibialis and Dorsalis pulse intact bilaterally: Yes Comments Failed monofilament test      PHQ2/9: Depression screen [facility] 2/9 09/16/2018 06/13/2018 03/12/2018 12/13/2017 09/13/2017  Decreased Interest 0 0 0 0 0  Down, Depressed, Hopeless 0 0 0 0 0  PHQ - 2 Score 0 0 0 0 0  Altered sleeping 0 - - - -  Tired, decreased energy 0 - - - -  Change in appetite 0 - - - -  Feeling bad or failure about yourself  0 - - - -  Trouble concentrating 0 - - - -  Moving slowly or fidgety/restless 0 - - - -  Suicidal thoughts 0 - - - -  PHQ-9 Score 0 - - - -  Difficult doing work/chores Not difficult at all - - - -    phq 9 is negative   Fall Risk: Fall Risk  09/16/2018 06/13/2018 03/12/2018 12/13/2017 09/13/2017  Falls in the past year? 1 Yes No No No  Number falls in past yr: 0 1 - - -  Injury with Fall? 1 Yes - - -    Functional Status Survey: Is the patient deaf or have difficulty hearing?: No Does the patient have difficulty seeing, even when wearing glasses/contacts?: Yes Does the patient have difficulty concentrating, remembering, or making decisions?: No Does the patient have difficulty walking or climbing stairs?: Yes Does the patient have difficulty dressing or bathing?: No Does the patient have difficulty doing errands alone such as visiting a doctor's office or shopping?: No    Assessment & Plan  1. Controlled type 2 diabetes mellitus with diabetic neuropathy, without long-term current use of insulin (HCC)  - POCT HgB A1C - metFORMIN (GLUCOPHAGE) 500 MG tablet; Take 1 tablet (500 mg total) by mouth daily with breakfast.  Dispense: 90 tablet; Refill: 0  2. Aortic atherosclerosis ([place])  On statin therapy   3. Chronic pain syndrome  No longer seeing pain clinic, he states it is because of his history of drug addiction, no  pain pills since Feb 2021   4. Obstructive apnea   5. History of drug abuse in remission ([place])  In remission since 2019  6. Essential hypertension  - atenolol (TENORMIN) 50 MG tablet; Take 1 tablet (50 mg total) by mouth daily.  Dispense:  90 tablet; Refill: 0  7. Dyslipidemia   8. Bilateral edema of lower extremity  - furosemide (LASIX) 40 MG tablet; Take 1 tablet (40 mg total) by mouth daily as needed for fluid.  Dispense: 90 tablet; Refill: 0   9. Retinitis pigmentosa   10. Legally blind  Seeing [facility]  11. 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)  stable  12. OSA (obstructive sleep apnea)  Stopped using CPAP   13. Hypertension associated with diabetes ([place])

## 2020-05-13 ENCOUNTER — Other Ambulatory Visit: Payer: Self-pay | Admitting: Orthopaedic Surgery

## 2020-05-13 DIAGNOSIS — S63501A Unspecified sprain of right wrist, initial encounter: Secondary | ICD-10-CM

## 2020-05-25 ENCOUNTER — Other Ambulatory Visit: Payer: Self-pay | Admitting: Family Medicine

## 2020-05-25 DIAGNOSIS — G894 Chronic pain syndrome: Secondary | ICD-10-CM | POA: Diagnosis not present

## 2020-05-25 DIAGNOSIS — E1100 Type 2 diabetes mellitus with hyperosmolarity without nonketotic hyperglycemic-hyperosmolar coma (NKHHC): Secondary | ICD-10-CM | POA: Diagnosis not present

## 2020-05-25 DIAGNOSIS — Z86010 Personal history of colonic polyps: Secondary | ICD-10-CM | POA: Diagnosis not present

## 2020-05-25 DIAGNOSIS — G4733 Obstructive sleep apnea (adult) (pediatric): Secondary | ICD-10-CM | POA: Diagnosis not present

## 2020-05-25 DIAGNOSIS — K625 Hemorrhage of anus and rectum: Secondary | ICD-10-CM | POA: Diagnosis not present

## 2020-05-25 NOTE — Telephone Encounter (Signed)
Requested medication (s) are due for refill today: Yes  Requested medication (s) are on the active medication list: No  Last refill:  n/a  Future visit scheduled: Yes  Notes to clinic:  Not on medication list, but pt. States he is taking it.    Requested Prescriptions  Pending Prescriptions Disp Refills   esomeprazole (NEXIUM) 40 MG capsule [Pharmacy Med Name: ESOMEPRAZOLE MAGNESIUM 40 MG CAP] 90 capsule     Sig: TAKE 1 CAPSULE BY MOUTH ONCE DAILY      Gastroenterology: Proton Pump Inhibitors Passed - 05/25/2020 11:40 AM      Passed - Valid encounter within last 12 months    Recent Outpatient Visits           3 weeks ago Controlled type 2 diabetes mellitus with diabetic neuropathy, without long-term current use of insulin [facility])   [facility] Steele Sizer, MD   1 year ago Chronic low back pain (Location of Primary Source of Pain) (Bilateral) (L>R)   [place], Satira Anis, MD   1 year ago Type 2 diabetes mellitus with complication, without long-term current use of insulin [facility])   [place], Satira Anis, MD   2 years ago Controlled type 2 diabetes mellitus with diabetic neuropathy, without long-term current use of insulin [facility])   [place], Satira Anis, MD   2 years ago Bilateral edema of lower extremity   [place], MD       Future Appointments             In 3 months Steele Sizer, MD [facility], [facility]

## 2020-05-28 ENCOUNTER — Ambulatory Visit
Admission: RE | Admit: 2020-05-28 | Discharge: 2020-05-28 | Disposition: A | Discharge status: Home / Self Care | Payer: Medicare Other | Source: Ambulatory Visit | Attending: Orthopaedic Surgery | Admitting: Orthopaedic Surgery

## 2020-05-28 ENCOUNTER — Other Ambulatory Visit: Payer: Self-pay

## 2020-05-28 DIAGNOSIS — S63501A Unspecified sprain of right wrist, initial encounter: Secondary | ICD-10-CM | POA: Diagnosis not present

## 2020-05-28 DIAGNOSIS — M25531 Pain in right wrist: Secondary | ICD-10-CM | POA: Diagnosis not present

## 2020-05-28 DIAGNOSIS — X58XXXA Exposure to other specified factors, initial encounter: Secondary | ICD-10-CM | POA: Insufficient documentation

## 2020-05-28 DIAGNOSIS — M1811 Unilateral primary osteoarthritis of first carpometacarpal joint, right hand: Secondary | ICD-10-CM | POA: Diagnosis not present

## 2020-05-28 MED ORDER — GADOBUTROL 1 MMOL/ML IV SOLN: 0.5000 mL | Freq: Once | INTRAVENOUS | Status: DC | PRN

## 2020-05-28 MED ORDER — GADOBUTROL 1 MMOL/ML IV SOLN
0.0500 mL | Freq: Once | INTRAVENOUS | Status: AC | PRN
  Administered 2020-05-28: 0.05 mL

## 2020-05-28 MED ORDER — LIDOCAINE HCL (PF) 1 % IJ SOLN
5.0000 mL | Freq: Once | INTRAMUSCULAR | Status: AC
  Administered 2020-05-28: 5 mL
  Filled 2020-05-28: qty 5

## 2020-05-28 MED ORDER — SODIUM CHLORIDE (PF) 0.9 % IJ SOLN: 15.0000 mL | Freq: Once | INTRAMUSCULAR | Status: DC

## 2020-05-28 MED ORDER — IOHEXOL 180 MG/ML  SOLN
10.0000 mL | Freq: Once | INTRAMUSCULAR | Status: AC | PRN
  Administered 2020-05-28: 10 mL

## 2020-05-28 MED ORDER — SODIUM CHLORIDE (PF) 0.9 % IJ SOLN
15.0000 mL | Freq: Once | INTRAMUSCULAR | Status: AC
  Administered 2020-05-28: 15 mL

## 2020-06-01 ENCOUNTER — Other Ambulatory Visit
Admission: RE | Admit: 2020-06-01 | Discharge: 2020-06-01 | Disposition: A | Discharge status: Home / Self Care | Payer: Medicare Other | Source: Ambulatory Visit | Attending: Gastroenterology | Admitting: Gastroenterology

## 2020-06-01 ENCOUNTER — Other Ambulatory Visit: Payer: Self-pay

## 2020-06-01 DIAGNOSIS — Z01812 Encounter for preprocedural laboratory examination: Secondary | ICD-10-CM | POA: Diagnosis not present

## 2020-06-01 DIAGNOSIS — Z20822 Contact with and (suspected) exposure to covid-19: Secondary | ICD-10-CM | POA: Diagnosis not present

## 2020-06-01 LAB — SARS CORONAVIRUS 2 (TAT 6-24 HRS): SARS Coronavirus 2: NEGATIVE

## 2020-06-02 ENCOUNTER — Encounter: Payer: Self-pay | Admitting: *Deleted

## 2020-06-03 ENCOUNTER — Encounter
Admission: RE | Disposition: A | Discharge status: Home / Self Care | Payer: Self-pay | Source: Home / Self Care | Attending: Gastroenterology

## 2020-06-03 ENCOUNTER — Other Ambulatory Visit: Payer: Self-pay

## 2020-06-03 ENCOUNTER — Ambulatory Visit
Admission: RE | Admit: 2020-06-03 | Discharge: 2020-06-03 | Disposition: A | Discharge status: Home / Self Care | Payer: Medicare Other | Attending: Gastroenterology | Admitting: Gastroenterology

## 2020-06-03 ENCOUNTER — Ambulatory Visit: Payer: Medicare Other | Admitting: Certified Registered Nurse Anesthetist

## 2020-06-03 ENCOUNTER — Encounter: Payer: Self-pay | Admitting: *Deleted

## 2020-06-03 DIAGNOSIS — Z79899 Other long term (current) drug therapy: Secondary | ICD-10-CM | POA: Insufficient documentation

## 2020-06-03 DIAGNOSIS — E119 Type 2 diabetes mellitus without complications: Secondary | ICD-10-CM | POA: Diagnosis not present

## 2020-06-03 DIAGNOSIS — K460 Unspecified abdominal hernia with obstruction, without gangrene: Secondary | ICD-10-CM | POA: Diagnosis not present

## 2020-06-03 DIAGNOSIS — K640 First degree hemorrhoids: Secondary | ICD-10-CM | POA: Diagnosis not present

## 2020-06-03 DIAGNOSIS — G4730 Sleep apnea, unspecified: Secondary | ICD-10-CM | POA: Diagnosis not present

## 2020-06-03 DIAGNOSIS — Z7982 Long term (current) use of aspirin: Secondary | ICD-10-CM | POA: Diagnosis not present

## 2020-06-03 DIAGNOSIS — Z87891 Personal history of nicotine dependence: Secondary | ICD-10-CM | POA: Diagnosis not present

## 2020-06-03 DIAGNOSIS — K219 Gastro-esophageal reflux disease without esophagitis: Secondary | ICD-10-CM | POA: Insufficient documentation

## 2020-06-03 DIAGNOSIS — Z86010 Personal history of colonic polyps: Secondary | ICD-10-CM | POA: Diagnosis not present

## 2020-06-03 DIAGNOSIS — E1140 Type 2 diabetes mellitus with diabetic neuropathy, unspecified: Secondary | ICD-10-CM | POA: Diagnosis not present

## 2020-06-03 DIAGNOSIS — D123 Benign neoplasm of transverse colon: Secondary | ICD-10-CM | POA: Insufficient documentation

## 2020-06-03 DIAGNOSIS — I10 Essential (primary) hypertension: Secondary | ICD-10-CM | POA: Insufficient documentation

## 2020-06-03 DIAGNOSIS — I700 Atherosclerosis of aorta: Secondary | ICD-10-CM | POA: Insufficient documentation

## 2020-06-03 DIAGNOSIS — K635 Polyp of colon: Secondary | ICD-10-CM | POA: Diagnosis not present

## 2020-06-03 DIAGNOSIS — E785 Hyperlipidemia, unspecified: Secondary | ICD-10-CM | POA: Insufficient documentation

## 2020-06-03 DIAGNOSIS — Z7984 Long term (current) use of oral hypoglycemic drugs: Secondary | ICD-10-CM | POA: Diagnosis not present

## 2020-06-03 DIAGNOSIS — Z09 Encounter for follow-up examination after completed treatment for conditions other than malignant neoplasm: Secondary | ICD-10-CM | POA: Diagnosis present

## 2020-06-03 DIAGNOSIS — Z1211 Encounter for screening for malignant neoplasm of colon: Secondary | ICD-10-CM | POA: Diagnosis not present

## 2020-06-03 HISTORY — DX: Sleep apnea, unspecified: G47.30

## 2020-06-03 HISTORY — DX: Gout, unspecified: M10.9

## 2020-06-03 HISTORY — PX: COLONOSCOPY WITH PROPOFOL: SHX5780

## 2020-06-03 HISTORY — DX: Pigmentary retinal dystrophy: H35.52

## 2020-06-03 LAB — URINE DRUG SCREEN, QUALITATIVE (ARMC ONLY)
Amphetamines, Ur Screen: NOT DETECTED
Barbiturates, Ur Screen: NOT DETECTED
Benzodiazepine, Ur Scrn: NOT DETECTED
Cannabinoid 50 Ng, Ur ~~LOC~~: POSITIVE — AB
Cocaine Metabolite,Ur ~~LOC~~: NOT DETECTED
MDMA (Ecstasy)Ur Screen: NOT DETECTED
Methadone Scn, Ur: NOT DETECTED
Opiate, Ur Screen: NOT DETECTED
Phencyclidine (PCP) Ur S: NOT DETECTED
Tricyclic, Ur Screen: NOT DETECTED

## 2020-06-03 LAB — HM COLONOSCOPY

## 2020-06-03 SURGERY — COLONOSCOPY WITH PROPOFOL
Anesthesia: General

## 2020-06-03 MED ORDER — MIDAZOLAM HCL 2 MG/2ML IJ SOLN
INTRAMUSCULAR | Status: AC
  Filled 2020-06-03: qty 2

## 2020-06-03 MED ORDER — SODIUM CHLORIDE 0.9 % IV SOLN: INTRAVENOUS | Status: DC

## 2020-06-03 MED ORDER — PROPOFOL 500 MG/50ML IV EMUL
INTRAVENOUS | Status: DC | PRN
  Administered 2020-06-03: 180 ug/kg/min via INTRAVENOUS

## 2020-06-03 MED ORDER — MIDAZOLAM HCL 2 MG/2ML IJ SOLN
INTRAMUSCULAR | Status: DC | PRN
  Administered 2020-06-03 (×2): 2 mg via INTRAVENOUS

## 2020-06-03 MED ORDER — PROPOFOL 10 MG/ML IV BOLUS
INTRAVENOUS | Status: DC | PRN
  Administered 2020-06-03: 80 mg via INTRAVENOUS
  Administered 2020-06-03: 20 mg via INTRAVENOUS

## 2020-06-03 NOTE — Transfer of Care (Signed)
Immediate Anesthesia Transfer of Care Note  Patient: Joel Bryant  Procedure(s) Performed: COLONOSCOPY WITH PROPOFOL (N/A )  Patient Location: PACU  Anesthesia Type:General  Level of Consciousness: awake and alert   Airway & Oxygen Therapy: Patient Spontanous Breathing  Post-op Assessment: Report given to RN and Post -op Vital signs reviewed and stable  Post vital signs: Reviewed and stable  Last Vitals:  Vitals Value Taken Time  BP 110/62 06/03/20 1029  Temp    Pulse 71 06/03/20 1029  Resp 19 06/03/20 1029  SpO2 99 % 06/03/20 1029  Vitals shown include unvalidated device data.  Last Pain:  Vitals:   06/03/20 0855  TempSrc: Temporal  PainSc: 8          Complications: No complications documented.

## 2020-06-03 NOTE — Anesthesia Preprocedure Evaluation (Signed)
Anesthesia Evaluation  Patient identified by MRN, date of birth, ID band Patient awake    Reviewed: Allergy & Precautions, H&P , NPO status , Patient's Chart, lab work & pertinent test results  History of Anesthesia Complications Negative for: history of anesthetic complications  Airway Mallampati: III  TM Distance: <3 FB Neck ROM: limited    Dental  (+) Chipped, Poor Dentition, Dental Advidsory Given   Pulmonary neg shortness of breath, sleep apnea (history of) , neg COPD, neg recent URI, former smoker,           Cardiovascular hypertension, (-) angina(-) Past MI and (-) DOE (-) dysrhythmias (-) Valvular Problems/Murmurs     Neuro/Psych neg Seizures PSYCHIATRIC DISORDERS Anxiety  Neuromuscular disease    GI/Hepatic Neg liver ROS, GERD  Medicated and Controlled,  Endo/Other  diabetes, Well Controlled, Type 2  Renal/GU negative Renal ROS  negative genitourinary   Musculoskeletal  (+) Arthritis ,   Abdominal   Peds  Hematology negative hematology ROS (+)   Anesthesia Other Findings Past Medical History: No date: Anxiety 06/13/2018: Aortic atherosclerosis ([place])     Comment:  July 2019 CT scan 09/13/2015: Chronic LBP 09/27/2015: Chronic left hip pain No date: Diabetes mellitus without complication (HCC) No date: Fracture, vertebra, pathologic No date: GERD (gastroesophageal reflux disease) No date: Hyperlipidemia No date: Hyperlipidemia No date: Hypertension No date: Hypomagnesemia  Past Surgical History: 1992: CATARACT EXTRACTION     Comment:  Insert Prosthetic Lens 02/28/2017: COLONOSCOPY WITH PROPOFOL; N/A     Comment:  Procedure: COLONOSCOPY WITH PROPOFOL;  Surgeon: Manya Silvas, MD;  Location: [facility] ENDOSCOPY;  Service:               Endoscopy;  Laterality: N/A; No date: EYE SURGERY 2001: LAMINECTOMY     Comment:  lumbar No date: MULTIPLE TOOTH EXTRACTIONS     Comment:  2018 No date:  TIBIA FRACTURE SURGERY No date: TONSILLECTOMY No date: WRIST SURGERY; Right     Comment:  x2  BMI    Body Mass Index:  32.49 kg/m      Reproductive/Obstetrics negative OB ROS                             Anesthesia Physical  Anesthesia Plan  ASA: III  Anesthesia Plan: General   Post-op Pain Management:    Induction: Intravenous  PONV Risk Score and Plan: Propofol infusion and TIVA  Airway Management Planned: Natural Airway and Nasal Cannula  Additional Equipment:   Intra-op Plan:   Post-operative Plan:   Informed Consent: I have reviewed the patients History and Physical, chart, labs and discussed the procedure including the risks, benefits and alternatives for the proposed anesthesia with the patient or authorized representative who has indicated his/her understanding and acceptance.     Dental Advisory Given  Plan Discussed with: Anesthesiologist, CRNA and Surgeon  Anesthesia Plan Comments: (Patient consented for risks of anesthesia including but not limited to:  - adverse reactions to medications - risk of intubation if required - damage to teeth, lips or other oral mucosa - sore throat or hoarseness - Damage to heart, brain, lungs or loss of life  Patient voiced understanding.)        Anesthesia Quick Evaluation

## 2020-06-03 NOTE — Interval H&P Note (Signed)
History and Physical Interval Note:  06/03/2020 9:42 AM  Joel Bryant  has presented today for surgery, with the diagnosis of P HX POLYPS.  The various methods of treatment have been discussed with the patient and family. After consideration of risks, benefits and other options for treatment, the patient has consented to  Procedure(s): COLONOSCOPY WITH PROPOFOL (N/A) as a surgical intervention.  The patient's history has been reviewed, patient examined, no change in status, stable for surgery.  I have reviewed the patient's chart and labs.  Questions were answered to the patient's satisfaction.     [name]  Ok to proceed with colonoscopy.

## 2020-06-03 NOTE — H&P (Signed)
Outpatient short stay form Pre-procedure 06/03/2020 9:39 AM Raylene Miyamoto MD, MPH  Primary Physician: Dr. Ancil Boozer  Reason for visit:  Personal History of polyps  History of present illness:  59 y/o gentleman with what appears to be more than 10 TA's in the past here for surveillance colonoscopy. Last colonoscopy was in 2018 with 3 small TA's. Experienced som rectal bleeding about six months ago that seems to have resolved. No blood thinners, abdominal surgeries, or known family history of GI malignancies.    Current Facility-Administered Medications:  .  0.9 %  sodium chloride infusion, , Intravenous, Continuous, Rylin Saez, Hilton Cork, MD, Last Rate: 20 mL/hr at 06/03/20 0930, New Bag at 06/03/20 0930  Medications Prior to Admission  Medication Sig Dispense Refill Last Dose  . Aspirin-Calcium Carbonate 81-777 MG TABS Take 1 tablet by mouth daily.     Marland Kitchen atenolol (TENORMIN) 100 MG tablet Take 100 mg by mouth daily.   06/02/2020 at Unknown time  . atorvastatin (LIPITOR) 20 MG tablet Take 1 tablet (20 mg total) by mouth daily at 6 PM. 90 tablet 0 06/02/2020 at Unknown time  . furosemide (LASIX) 40 MG tablet Take 1 tablet (40 mg total) by mouth daily as needed for fluid. 90 tablet 0 Past Week at Unknown time  . gabapentin (NEURONTIN) 300 MG capsule Take 300 mg by mouth 3 (three) times daily.   Past Week at Unknown time  . ibuprofen (ADVIL) 200 MG tablet Take 600-800 mg by mouth every 8 (eight) hours as needed for headache.     . lidocaine (LIDODERM) 5 % Place 1 patch onto the skin every 12 (twelve) hours. Remove & Discard patch within 12 hours or as directed by MD     . lisinopril (PRINIVIL,ZESTRIL) 2.5 MG tablet Take 2 tablets (5 mg total) by mouth every evening. (Patient taking differently: Take 2.5 mg by mouth every evening. )     . lisinopril (ZESTRIL) 2.5 MG tablet Take 2.5 mg by mouth daily.   Past Week at Unknown time  . metFORMIN (GLUCOPHAGE) 500 MG tablet Take 1 tablet (500 mg total) by mouth  daily with breakfast. (Patient taking differently: Take 1,000 mg by mouth 2 (two) times daily with a meal. ) 90 tablet 0 Past Week at Unknown time  . omega-3 acid ethyl esters (LOVAZA) 1 g capsule Take 2 capsules (2 g total) by mouth 2 (two) times daily. 360 capsule 0 06/02/2020 at Unknown time  . ACCU-CHEK SOFTCLIX LANCETS lancets USE AS DIRECTED TO CHECK BLOOD SUGAR TWICE A DAY 100 each 2   . aspirin 81 MG tablet Take 81 mg by mouth every evening.      Marland Kitchen atenolol (TENORMIN) 50 MG tablet Take 1 tablet (50 mg total) by mouth daily. 90 tablet 0   . diphenhydramine-acetaminophen (TYLENOL PM) 25-500 MG TABS tablet Take 1-2 tablets by mouth at bedtime as needed (sleep).      Marland Kitchen esomeprazole (NEXIUM) 40 MG capsule TAKE 1 CAPSULE BY MOUTH ONCE DAILY (Patient not taking: Reported on 06/03/2020) 90 capsule 0 Not Taking at Unknown time  . gabapentin (NEURONTIN) 100 MG capsule Take 1 capsule (100 mg total) by mouth 3 (three) times daily. 90 capsule 0   . metaxalone (SKELAXIN) 800 MG tablet Take 1 tablet (800 mg total) by mouth 3 (three) times daily. (Patient not taking: Reported on 06/03/2020) 90 tablet 0 Not Taking at Unknown time  . sitaGLIPtin (JANUVIA) 50 MG tablet Take 50 mg by mouth daily. (Patient not taking:  Reported on 06/03/2020)   Not Taking at Unknown time     No Known Allergies   Past Medical History:  Diagnosis Date  . Anxiety   . Aortic atherosclerosis ([place]) 06/13/2018   July 2019 CT scan  . Chronic LBP 09/13/2015  . Chronic left hip pain 09/27/2015  . Diabetes mellitus without complication ([place])   . Fracture, vertebra, pathologic   . Gout   . Hyperlipidemia   . Hypertension   . Hypomagnesemia   . Retinitis pigmentosa   . Sleep apnea     Review of systems:  Otherwise negative.    Physical Exam  Gen: Alert, oriented. Appears stated age.  HEENT: [place]/AT. PERRLA. Lungs: no respiratory distress Abd: soft, benign, no masses. Ext: No edema.     Planned procedures: Proceed with  colonoscopy. The patient understands the nature of the planned procedure, indications, risks, alternatives and potential complications including but not limited to bleeding, infection, perforation, damage to internal organs and possible oversedation/side effects from anesthesia. The patient agrees and gives consent to proceed.  Please refer to procedure notes for findings, recommendations and patient disposition/instructions.     Raylene Miyamoto MD, MPH Gastroenterology 06/03/2020  9:39 AM

## 2020-06-03 NOTE — Op Note (Signed)
[facility] Gastroenterology Patient Name: Joel Bryant Procedure Date: 06/03/2020 9:24 AM MRN: [id number] Account #: [id number] Date of Birth: Jan 09, 1961 Admit Type: Outpatient Age: 59 Room: [facility] ENDO ROOM 4 Gender: Male Note Status: Finalized Procedure:             Colonoscopy Indications:           High risk colon cancer surveillance: Personal history                         of adenoma (10 mm or greater in size), High risk colon                         cancer surveillance: Personal history of multiple (3                         or more) adenomas Providers:             Andrey Farmer MD, MD Referring MD:          Bethena Roys. Sowles, MD (Referring MD) Medicines:             Monitored Anesthesia Care Complications:         No immediate complications. Estimated blood loss:                         Minimal. Procedure:             Pre-Anesthesia Assessment:                        - Prior to the procedure, a History and Physical was                         performed, and patient medications and allergies were                         reviewed. The patient is competent. The risks and                         benefits of the procedure and the sedation options and                         risks were discussed with the patient. All questions                         were answered and informed consent was obtained.                         Patient identification and proposed procedure were                         verified by the physician, the nurse, the anesthetist                         and the technician in the endoscopy suite. Mental                         Status Examination: alert and oriented. Airway  Examination: normal oropharyngeal airway and neck                         mobility. Respiratory Examination: clear to                         auscultation. CV Examination: normal. Prophylactic                         Antibiotics: The patient does not  require prophylactic                         antibiotics. Prior Anticoagulants: The patient has                         taken no previous anticoagulant or antiplatelet agents                         except for aspirin. ASA Grade Assessment: II - A                         patient with mild systemic disease. After reviewing                         the risks and benefits, the patient was deemed in                         satisfactory condition to undergo the procedure. The                         anesthesia plan was to use monitored anesthesia care                         (MAC). Immediately prior to administration of                         medications, the patient was re-assessed for adequacy                         to receive sedatives. The heart rate, respiratory                         rate, oxygen saturations, blood pressure, adequacy of                         pulmonary ventilation, and response to care were                         monitored throughout the procedure. The physical                         status of the patient was re-assessed after the                         procedure.                        After obtaining informed consent, the colonoscope was  passed under direct vision. Throughout the procedure,                         the patient's blood pressure, pulse, and oxygen                         saturations were monitored continuously. The                         Colonoscope was introduced through the anus and                         advanced to the the cecum, identified by appendiceal                         orifice and ileocecal valve. The colonoscopy was                         performed without difficulty. The patient tolerated                         the procedure well. The quality of the bowel                         preparation was fair. Findings:      The perianal and digital rectal examinations were normal.      Liquid stool was found at the  hepatic flexure, in the ascending colon       and in the cecum, interfering with visualization. Lavage of the area was       performed, resulting in clearance with fair visualization.      A 2 mm polyp was found in the transverse colon. The polyp was sessile.       The polyp was removed with a jumbo cold forceps. Resection and retrieval       were complete. Estimated blood loss was minimal.      Internal hemorrhoids were found during retroflexion. The hemorrhoids       were Grade I (internal hemorrhoids that do not prolapse).      The exam was otherwise without abnormality on direct and retroflexion       views. Impression:            - Preparation of the colon was fair.                        - Stool at the hepatic flexure, in the ascending colon                         and in the cecum.                        - One 2 mm polyp in the transverse colon, removed with                         a jumbo cold forceps. Resected and retrieved.                        - Internal hemorrhoids.                        -  The examination was otherwise normal on direct and                         retroflexion views. Recommendation:        - Discharge patient to home.                        - Resume previous diet.                        - Continue present medications.                        - Await pathology results.                        - Repeat colonoscopy in 1 year because the bowel                         preparation was suboptimal.                        - Return to referring physician as previously                         scheduled. Procedure Code(s):     --- Professional ---                        [phone], Colonoscopy, flexible; with biopsy, single or                         multiple Diagnosis Code(s):     --- Professional ---                        K64.0, First degree hemorrhoids                        K63.5, Polyp of colon                        Z86.010, Personal history of colonic polyps CPT  copyright 2019 American Medical Association. All rights reserved. The codes documented in this report are preliminary and upon coder review may  be revised to meet current compliance requirements. Andrey Farmer, MD Andrey Farmer MD, MD 06/03/2020 10:30:41 AM Number of Addenda: 0 Note Initiated On: 06/03/2020 9:24 AM Scope Withdrawal Time: 0 hours 12 minutes 6 seconds  Total Procedure Duration: 0 hours 19 minutes 2 seconds  Estimated Blood Loss:  Estimated blood loss was minimal.      [facility]

## 2020-06-03 NOTE — Anesthesia Postprocedure Evaluation (Signed)
Anesthesia Post Note  Patient: Joel Bryant  Procedure(s) Performed: COLONOSCOPY WITH PROPOFOL (N/A )  Patient location during evaluation: Endoscopy Anesthesia Type: General Level of consciousness: awake and alert Pain management: pain level controlled Vital Signs Assessment: post-procedure vital signs reviewed and stable Respiratory status: spontaneous breathing, nonlabored ventilation, respiratory function stable and patient connected to nasal cannula oxygen Cardiovascular status: blood pressure returned to baseline and stable Postop Assessment: no apparent nausea or vomiting Anesthetic complications: no   No complications documented.   Last Vitals:  Vitals:   06/03/20 0855 06/03/20 1027  BP: (!) 151/96 110/62  Pulse: 67   Resp: 18   Temp: (!) 36.2 C (!) 36.1 C  SpO2: 100%     Last Pain:  Vitals:   06/03/20 1057  TempSrc:   PainSc: 0-No pain                 Martha Clan

## 2020-06-04 ENCOUNTER — Encounter: Payer: Self-pay | Admitting: Gastroenterology

## 2020-06-04 LAB — SURGICAL PATHOLOGY

## 2020-06-08 DIAGNOSIS — M19031 Primary osteoarthritis, right wrist: Secondary | ICD-10-CM | POA: Diagnosis not present

## 2020-08-12 ENCOUNTER — Other Ambulatory Visit: Payer: Self-pay | Admitting: Otolaryngology

## 2020-08-12 DIAGNOSIS — F458 Other somatoform disorders: Secondary | ICD-10-CM

## 2020-08-12 DIAGNOSIS — R070 Pain in throat: Secondary | ICD-10-CM | POA: Diagnosis not present

## 2020-08-12 DIAGNOSIS — R221 Localized swelling, mass and lump, neck: Secondary | ICD-10-CM | POA: Diagnosis not present

## 2020-08-25 ENCOUNTER — Other Ambulatory Visit: Payer: Self-pay

## 2020-08-25 ENCOUNTER — Ambulatory Visit
Admission: RE | Admit: 2020-08-25 | Discharge: 2020-08-25 | Disposition: A | Discharge status: Home / Self Care | Payer: Medicare Other | Source: Ambulatory Visit | Attending: Otolaryngology | Admitting: Otolaryngology

## 2020-08-25 DIAGNOSIS — F458 Other somatoform disorders: Secondary | ICD-10-CM | POA: Diagnosis not present

## 2020-08-25 DIAGNOSIS — R1310 Dysphagia, unspecified: Secondary | ICD-10-CM | POA: Diagnosis not present

## 2020-08-25 LAB — POCT I-STAT CREATININE: Creatinine, Ser: 0.7 mg/dL (ref 0.61–1.24)

## 2020-08-25 MED ORDER — IOHEXOL 300 MG/ML  SOLN
75.0000 mL | Freq: Once | INTRAMUSCULAR | Status: AC | PRN
  Administered 2020-08-25: 75 mL via INTRAVENOUS

## 2020-09-03 ENCOUNTER — Ambulatory Visit (INDEPENDENT_AMBULATORY_CARE_PROVIDER_SITE_OTHER): Payer: Medicare Other | Admitting: Family Medicine

## 2020-09-03 ENCOUNTER — Other Ambulatory Visit: Payer: Self-pay

## 2020-09-03 ENCOUNTER — Encounter: Payer: Self-pay | Admitting: Family Medicine

## 2020-09-03 VITALS — BP 120/76 | HR 71 | Temp 98.1°F | Resp 17 | Ht 69.0 in | Wt 192.2 lb

## 2020-09-03 DIAGNOSIS — E1159 Type 2 diabetes mellitus with other circulatory complications: Secondary | ICD-10-CM

## 2020-09-03 DIAGNOSIS — E1140 Type 2 diabetes mellitus with diabetic neuropathy, unspecified: Secondary | ICD-10-CM | POA: Diagnosis not present

## 2020-09-03 DIAGNOSIS — G8929 Other chronic pain: Secondary | ICD-10-CM

## 2020-09-03 DIAGNOSIS — I10 Essential (primary) hypertension: Secondary | ICD-10-CM

## 2020-09-03 DIAGNOSIS — G894 Chronic pain syndrome: Secondary | ICD-10-CM | POA: Diagnosis not present

## 2020-09-03 DIAGNOSIS — H3552 Pigmentary retinal dystrophy: Secondary | ICD-10-CM

## 2020-09-03 DIAGNOSIS — R634 Abnormal weight loss: Secondary | ICD-10-CM | POA: Diagnosis not present

## 2020-09-03 DIAGNOSIS — I152 Hypertension secondary to endocrine disorders: Secondary | ICD-10-CM

## 2020-09-03 DIAGNOSIS — E781 Pure hyperglyceridemia: Secondary | ICD-10-CM

## 2020-09-03 DIAGNOSIS — M47816 Spondylosis without myelopathy or radiculopathy, lumbar region: Secondary | ICD-10-CM

## 2020-09-03 DIAGNOSIS — H548 Legal blindness, as defined in USA: Secondary | ICD-10-CM

## 2020-09-03 DIAGNOSIS — Z1159 Encounter for screening for other viral diseases: Secondary | ICD-10-CM | POA: Diagnosis not present

## 2020-09-03 DIAGNOSIS — I700 Atherosclerosis of aorta: Secondary | ICD-10-CM

## 2020-09-03 DIAGNOSIS — R600 Localized edema: Secondary | ICD-10-CM

## 2020-09-03 DIAGNOSIS — G4733 Obstructive sleep apnea (adult) (pediatric): Secondary | ICD-10-CM

## 2020-09-03 DIAGNOSIS — R0989 Other specified symptoms and signs involving the circulatory and respiratory systems: Secondary | ICD-10-CM

## 2020-09-03 DIAGNOSIS — M25552 Pain in left hip: Secondary | ICD-10-CM

## 2020-09-03 DIAGNOSIS — M25551 Pain in right hip: Secondary | ICD-10-CM

## 2020-09-03 DIAGNOSIS — K219 Gastro-esophageal reflux disease without esophagitis: Secondary | ICD-10-CM

## 2020-09-03 DIAGNOSIS — E785 Hyperlipidemia, unspecified: Secondary | ICD-10-CM

## 2020-09-03 DIAGNOSIS — F1911 Other psychoactive substance abuse, in remission: Secondary | ICD-10-CM

## 2020-09-03 LAB — POCT GLYCOSYLATED HEMOGLOBIN (HGB A1C): Hemoglobin A1C: 5.2 % (ref 4.0–5.6)

## 2020-09-03 MED ORDER — OMEGA-3-ACID ETHYL ESTERS 1 G PO CAPS: 2.0000 g | ORAL_CAPSULE | Freq: Two times a day (BID) | ORAL | 1 refills | Status: DC

## 2020-09-03 MED ORDER — ATENOLOL 50 MG PO TABS: 50.0000 mg | ORAL_TABLET | Freq: Every day | ORAL | 1 refills | Status: DC

## 2020-09-03 MED ORDER — LISINOPRIL 2.5 MG PO TABS: 2.5000 mg | ORAL_TABLET | Freq: Every day | ORAL | 1 refills | Status: DC

## 2020-09-03 MED ORDER — FUROSEMIDE 40 MG PO TABS: 40.0000 mg | ORAL_TABLET | Freq: Every day | ORAL | 0 refills | Status: DC | PRN

## 2020-09-03 MED ORDER — ATORVASTATIN CALCIUM 20 MG PO TABS: 20.0000 mg | ORAL_TABLET | Freq: Every day | ORAL | 1 refills | Status: DC

## 2020-09-03 MED ORDER — ESOMEPRAZOLE MAGNESIUM 40 MG PO CPDR: 40.0000 mg | DELAYED_RELEASE_CAPSULE | Freq: Every day | ORAL | 1 refills | Status: DC

## 2020-09-03 NOTE — Progress Notes (Signed)
Name: Joel Bryant   MRN: [id number]    DOB: [birth year]   Date:09/03/2020       Progress Note  Subjective  Chief Complaint  Follow up  HPI   DMII: diagnosed years ago, he states used to take Januvia and also Metformin, however on his last visit we decreased Metformin to 500 mg daily and A1C is still at goal, we will stop diabetes medications today, he also stopped taking gabapentin he states has some paresthesia of toes but no pain and does not want to take gabapentin, stopped on his own.. Denies polyphagia, polyuria or polydipsia . He has lost another 11 lbs in the past 4 months,  He has a history of obesity. He left our practice in 2019 and was seeing Dr. Nancy Fetter but decided to come back, weight back in 08/2018 here in our office was 233 lbs . Today A1C is 5.2 %.  Weight loss: seen by Dr. Pryor Ochoa because he was having difficulty swallowing, he had CT neck and showed some thickness on his esophagus, he states he was advised to resume Nexium and added Allegra, was given antibiotics and is feeling better, Dr. Pryor Ochoa recommended follow up with GI but he states he does not want to go, explained it is important for him to go , especially because of the significant weight loss.   Aorta Atherosclerosis:found  on CT abdomen done 05/22/2018 , he is taking statin and aspirin, no side effects    History of drug abuse: he states he has used all kinds of drugs, including cocaine, heroine, but has been clean for over 2 years. He states cannot go to the pain clinic because of his history. He is still in remission.   Chronic back pain, failed back surgery, also has Osteonecrosis of hips and chronic hip and knee pain: he has difficulty sleeping well, has to shift positions constantly and pain is 8/10. He states back spasms are bothersome. No bowel or bladder incontinence, he states baclofen made him feel loopy but would like to try another muscle relaxer, explained we can try gabapentin and titrate up slowly. He  agrees.   HTN: taking bp medication, down to 50 mg of Atenolol and bp is at goal, no chest pain or palpitation   Patient Active Problem List   Diagnosis Date Noted  . History of drug abuse in remission ([place]) 04/30/2020  . Closed compression fracture of thoracic vertebra ([place]) 06/13/2018  . Fatty liver 06/13/2018  . Aortic atherosclerosis ([place]) 06/13/2018  . Chronic pain syndrome 09/06/2016  . Dyslipidemia associated with type 2 diabetes mellitus ([place]) 09/27/2015  . H/O adenomatous polyp of colon 09/27/2015  . Dysmetabolic syndrome 23/76/2831  . Post Laminectomy Syndrome (L4-5 Hemilaminectomy and partial Facetectomy) 09/27/2015  . Lumbar spondylosis 09/27/2015  . 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 Epidural fibrosis 09/27/2015  . Chronic low back pain (Location of Primary Source of Pain) (Bilateral) (L>R) 09/27/2015  . Chronic left knee pain 09/27/2015  . Lumbar spinal stenosis (L4-5) 09/27/2015  . Lumbar foraminal stenosis (Bilateral L3-4 and L4-5) 09/27/2015  . Lumbar facet hypertrophy (Bilateral, Multilevel) 09/27/2015  . Lumbar facet syndrome (Bilateral) 09/27/2015  . Bilateral hip osteonecrosis ([place]) (Bilateral Femoral Head) 09/27/2015  . Chronic hip pain (Location of Secondary source of pain) (Bilateral) (L>R) (secondary to osteonecrosis) 09/27/2015  . Edema leg 09/13/2015  . Gout 09/13/2015  . Obstructive apnea 09/13/2015  . Anxiety disorder 06/11/2015  . Hypertriglyceridemia 06/11/2015  . Dyslipidemia 06/11/2015  . Controlled type 2  diabetes mellitus with diabetic neuropathy, without long-term current use of insulin ([place]) 06/11/2015  . 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 Retinitis pigmentosa 06/11/2015    Past Surgical History:  Procedure Laterality Date  . BACK SURGERY  2001   laminectomy lumbar spine  . CATARACT EXTRACTION  1992   Insert Prosthetic Lens  . COLONOSCOPY WITH PROPOFOL N/A 02/28/2017   Procedure: COLONOSCOPY WITH PROPOFOL;  Surgeon:  Manya Silvas, MD;  Location: [facility] ENDOSCOPY;  Service: Endoscopy;  Laterality: N/A;  . COLONOSCOPY WITH PROPOFOL N/A 06/03/2020   Procedure: COLONOSCOPY WITH PROPOFOL;  Surgeon: [name], MD;  Location: [facility] ENDOSCOPY;  Service: Endoscopy;  Laterality: N/A;  . EYE SURGERY    . KYPHOPLASTY N/A 08/08/2018   Procedure: [id number];  Surgeon: Hessie Knows, MD;  Location: [facility] ORS;  Service: Orthopedics;  Laterality: N/A;  . LAMINECTOMY  2001   lumbar  . MULTIPLE TOOTH EXTRACTIONS     2018  . TIBIA FRACTURE SURGERY    . TONSILLECTOMY    . WRIST SURGERY Right    x2    Family History  Problem Relation Age of Onset  . Colon polyps Mother   . Retinitis pigmentosa Mother   . Retinitis pigmentosa Maternal Grandmother   . Cancer Maternal Grandfather   . Diabetes Paternal Grandmother   . Alcohol abuse Father     Social History   Tobacco Use  . Smoking status: Former Smoker    Types: Cigarettes  . Smokeless tobacco: Never Used  . Tobacco comment: Quit smoking 4 years now  Substance Use Topics  . Alcohol use: Yes    Alcohol/week: 0.0 standard drinks    Comment: 10 beer today     Current Outpatient Medications:  .  ACCU-CHEK SOFTCLIX LANCETS lancets, USE AS DIRECTED TO CHECK BLOOD SUGAR TWICE A DAY, Disp: 100 each, Rfl: 2 .  aspirin 81 MG tablet, Take 81 mg by mouth every evening. , Disp: , Rfl:  .  Aspirin-Calcium Carbonate 81-777 MG TABS, Take 1 tablet by mouth daily., Disp: , Rfl:  .  atenolol (TENORMIN) 50 MG tablet, Take 1 tablet (50 mg total) by mouth daily., Disp: 90 tablet, Rfl: 1 .  atorvastatin (LIPITOR) 20 MG tablet, Take 1 tablet (20 mg total) by mouth daily at 6 PM., Disp: 90 tablet, Rfl: 1 .  furosemide (LASIX) 40 MG tablet, Take 1 tablet (40 mg total) by mouth daily as needed for fluid., Disp: 90 tablet, Rfl: 0 .  lisinopril (ZESTRIL) 2.5 MG tablet, Take 1 tablet (2.5 mg total) by mouth daily., Disp: 90 tablet, Rfl: 1 .  omega-3 acid ethyl esters  (LOVAZA) 1 g capsule, Take 2 capsules (2 g total) by mouth 2 (two) times daily., Disp: 360 capsule, Rfl: 1 .  esomeprazole (NEXIUM) 40 MG capsule, Take 1 capsule (40 mg total) by mouth daily., Disp: 90 capsule, Rfl: 1  No Known Allergies  I personally reviewed active problem list, medication list, allergies, family history, social history, health maintenance with the patient/caregiver today.   ROS  Constitutional: Negative for fever or weight change.  Respiratory: Negative for cough and shortness of breath.   Cardiovascular: Negative for chest pain or palpitations.  Gastrointestinal: Negative for abdominal pain, no bowel changes.  Musculoskeletal: Negative for gait problem or joint swelling.  Skin: Negative for rash.  Neurological: Negative for dizziness or headache.  No other specific complaints in a complete review of systems (except as listed in HPI above).  Objective  Vitals:  09/03/20 1011  BP: 120/76  Pulse: 71  Resp: 17  Temp: 98.1 F (36.7 C)  TempSrc: Oral  SpO2: 99%  Weight: 192 lb 3.2 oz (87.2 kg)  Height: 5\' 9"  (1.753 m)    Body mass index is 28.38 kg/m.  Physical Exam  Constitutional: Patient appears well-developed and well-nourished.   No distress.  HEENT: head atraumatic, normocephalic, pupils equal and reactive to light,  neck supple Cardiovascular: Normal rate, regular rhythm and normal heart sounds.  No murmur heard. No BLE edema, decrease distal pulse on right leg Pulmonary/Chest: Effort normal and breath sounds normal. No respiratory distress. Abdominal: Soft.  There is no tenderness. Psychiatric: Patient has a normal mood and affect. behavior is normal. Judgment and thought content normal.  Recent Results (from the past 2160 hour(s))  I-STAT creatinine     Status: None   Collection Time: 08/25/20 10:10 AM  Result Value Ref Range   Creatinine, Ser 0.70 0.61 - 1.24 mg/dL  POCT HgB A1C     Status: Normal   Collection Time: 09/03/20 10:31 AM   Result Value Ref Range   Hemoglobin A1C 5.2 4.0 - 5.6 %   HbA1c POC (<> result, manual entry)     HbA1c, POC (prediabetic range)     HbA1c, POC (controlled diabetic range)      Diabetic Foot Exam: Diabetic Foot Exam - Simple   Simple Foot Form Visual Inspection See comments: Yes Sensation Testing Intact to touch and monofilament testing bilaterally: Yes Pulse Check See comments: Yes Comments Dry skin, decrease dorsalis pulse on right side       PHQ2/9: Depression screen [facility] 2/9 09/03/2020 09/16/2018 06/13/2018 03/12/2018 12/13/2017  Decreased Interest 0 0 0 0 0  Down, Depressed, Hopeless 0 0 0 0 0  PHQ - 2 Score 0 0 0 0 0  Altered sleeping - 0 - - -  Tired, decreased energy - 0 - - -  Change in appetite - 0 - - -  Feeling bad or failure about yourself  - 0 - - -  Trouble concentrating - 0 - - -  Moving slowly or fidgety/restless - 0 - - -  Suicidal thoughts - 0 - - -  PHQ-9 Score - 0 - - -  Difficult doing work/chores - Not difficult at all - - -    phq 9 is negative   Fall Risk: Fall Risk  09/03/2020 09/16/2018 06/13/2018 03/12/2018 12/13/2017  Falls in the past year? 0 1 Yes No No  Number falls in past yr: 0 0 1 - -  Injury with Fall? 0 1 Yes - -     Functional Status Survey: Is the patient deaf or have difficulty hearing?: No Does the patient have difficulty seeing, even when wearing glasses/contacts?: Yes Does the patient have difficulty concentrating, remembering, or making decisions?: No Does the patient have difficulty walking or climbing stairs?: No Does the patient have difficulty dressing or bathing?: No Does the patient have difficulty doing errands alone such as visiting a doctor's office or shopping?: No    Assessment & Plan  1. Controlled type 2 diabetes mellitus with diabetic neuropathy, without long-term current use of insulin (HCC)  - HM Diabetes Foot Exam - POCT HgB A1C - Microalbumin / creatinine urine ratio - COMPLETE METABOLIC PANEL WITH  GFR  2. Obstructive apnea  Stopped it, he could not tolerate it and lost a lot of weight   3. Aortic atherosclerosis (HCC)  - Lipid panel - omega-3 acid  ethyl esters (LOVAZA) 1 g capsule; Take 2 capsules (2 g total) by mouth 2 (two) times daily.  Dispense: 360 capsule; Refill: 1  4. Chronic pain syndrome  Stable  5. Essential hypertension  - CBC with Differential/Platelet - atenolol (TENORMIN) 50 MG tablet; Take 1 tablet (50 mg total) by mouth daily.  Dispense: 90 tablet; Refill: 1 - lisinopril (ZESTRIL) 2.5 MG tablet; Take 1 tablet (2.5 mg total) by mouth daily.  Dispense: 90 tablet; Refill: 1  6. Bilateral edema of lower extremity  - furosemide (LASIX) 40 MG tablet; Take 1 tablet (40 mg total) by mouth daily as needed for fluid.  Dispense: 90 tablet; Refill: 0  7. Bilateral hip pain    8. Legally blind   9. Dyslipidemia  - atorvastatin (LIPITOR) 20 MG tablet; Take 1 tablet (20 mg total) by mouth daily at 6 PM.  Dispense: 90 tablet; Refill: 1  10. Hypertension associated with diabetes ([place])   11. Lumbar facet hypertrophy (Bilateral, Multilevel)   12. Need for hepatitis C screening test  - Hepatitis C antibody  13. Hypertriglyceridemia  - omega-3 acid ethyl esters (LOVAZA) 1 g capsule; Take 2 capsules (2 g total) by mouth 2 (two) times daily.  Dispense: 360 capsule; Refill: 1  14. Retinitis pigmentosa   15. History of drug abuse in remission ([place])   16. Bilateral leg edema   17. Gastroesophageal reflux disease without esophagitis  - esomeprazole (NEXIUM) 40 MG capsule; Take 1 capsule (40 mg total) by mouth daily.  Dispense: 90 capsule; Refill: 1  18. Weight loss observed on examination  - TSH - PSA - HIV Antibody (routine testing w rflx)   19. Decreased dorsalis pedis pulse  Discussed

## 2020-09-08 LAB — CBC WITH DIFFERENTIAL/PLATELET
Absolute Monocytes: 738 cells/uL (ref 200–950)
Basophils Absolute: 41 cells/uL (ref 0–200)
Basophils Relative: 0.6 %
Eosinophils Absolute: 83 cells/uL (ref 15–500)
Eosinophils Relative: 1.2 %
HCT: 50.1 % — ABNORMAL HIGH (ref 38.5–50.0)
Hemoglobin: 17.6 g/dL — ABNORMAL HIGH (ref 13.2–17.1)
Lymphs Abs: 1877 cells/uL (ref 850–3900)
MCH: 32.8 pg (ref 27.0–33.0)
MCHC: 35.1 g/dL (ref 32.0–36.0)
MCV: 93.5 fL (ref 80.0–100.0)
MPV: 10.4 fL (ref 7.5–12.5)
Monocytes Relative: 10.7 %
Neutro Abs: 4161 cells/uL (ref 1500–7800)
Neutrophils Relative %: 60.3 %
Platelets: 152 10*3/uL (ref 140–400)
RBC: 5.36 10*6/uL (ref 4.20–5.80)
RDW: 12.7 % (ref 11.0–15.0)
Total Lymphocyte: 27.2 %
WBC: 6.9 10*3/uL (ref 3.8–10.8)

## 2020-09-08 LAB — COMPLETE METABOLIC PANEL WITH GFR
AG Ratio: 1.8 (calc) (ref 1.0–2.5)
ALT: 32 U/L (ref 9–46)
AST: 81 U/L — ABNORMAL HIGH (ref 10–35)
Albumin: 4.8 g/dL (ref 3.6–5.1)
Alkaline phosphatase (APISO): 76 U/L (ref 35–144)
BUN: 10 mg/dL (ref 7–25)
CO2: 32 mmol/L (ref 20–32)
Calcium: 9.6 mg/dL (ref 8.6–10.3)
Chloride: 99 mmol/L (ref 98–110)
Creat: 0.71 mg/dL (ref 0.70–1.25)
GFR, Est African American: 118 mL/min/{1.73_m2} (ref >=60)
GFR, Est Non African American: 102 mL/min/{1.73_m2} (ref >=60)
Globulin: 2.7 g/dL (calc) (ref 1.9–3.7)
Glucose, Bld: 93 mg/dL (ref 65–99)
Potassium: 4 mmol/L (ref 3.5–5.3)
Sodium: 138 mmol/L (ref 135–146)
Total Bilirubin: 0.9 mg/dL (ref 0.2–1.2)
Total Protein: 7.5 g/dL (ref 6.1–8.1)

## 2020-09-08 LAB — HCV RNA,QUANTITATIVE REAL TIME PCR
HCV Quantitative Log: <1.18 Log IU/mL
HCV RNA, PCR, QN: <15 IU/mL

## 2020-09-08 LAB — PSA: PSA: 2.45 ng/mL (ref <=4.0)

## 2020-09-08 LAB — LIPID PANEL
Cholesterol: 181 mg/dL (ref <200)
HDL: 50 mg/dL (ref >=40)
Non-HDL Cholesterol (Calc): 131 mg/dL (calc) — ABNORMAL HIGH (ref <130)
Total CHOL/HDL Ratio: 3.6 (calc) (ref <5.0)
Triglycerides: 414 mg/dL — ABNORMAL HIGH (ref <150)

## 2020-09-08 LAB — MICROALBUMIN / CREATININE URINE RATIO
Creatinine, Urine: 223 mg/dL (ref 20–320)
Microalb Creat Ratio: 228 mcg/mg creat — ABNORMAL HIGH (ref <30)
Microalb, Ur: 50.9 mg/dL

## 2020-09-08 LAB — HEPATITIS C ANTIBODY
Hepatitis C Ab: REACTIVE — AB
SIGNAL TO CUT-OFF: 33 — ABNORMAL HIGH (ref <1.00)

## 2020-09-08 LAB — HIV ANTIBODY (ROUTINE TESTING W REFLEX): HIV 1&2 Ab, 4th Generation: NONREACTIVE

## 2020-09-08 LAB — TSH: TSH: 0.66 mIU/L (ref 0.40–4.50)

## 2020-09-09 ENCOUNTER — Telehealth: Payer: Self-pay | Admitting: Family Medicine

## 2020-09-09 NOTE — Telephone Encounter (Signed)
Pt wants to know if you need him to come in for appt to discuss hep c options. Please  Call back and advise the pt what to do

## 2020-09-27 ENCOUNTER — Ambulatory Visit: Payer: Self-pay

## 2020-09-27 NOTE — Telephone Encounter (Signed)
Tried calling patient to check on him, no answer w/no vm.

## 2020-09-27 NOTE — Telephone Encounter (Signed)
Pt states he is doing "wonderful today", pt still complains of back pain, but other than that he states he is doing good.

## 2020-09-27 NOTE — Telephone Encounter (Signed)
Patient friend called stating that she takes care of patient.  She is not on DPR.  She states that for 3 days patient has been having hallucinations and yesterday fell in the bathroom.She is unsure if he hit his head when he fell.   He had a seizure so she called EMS. Patient refused to go to hospital at that time.  She states that he is in a lot of pain from a bad back.  She states that he has had multiple surgeries.  She states that he was hallucinating prior to falling yesterday.  She states that he takes no narcotic medication. He has taken all his medications as prescribed.  She states that she fixes them for him. She states that he is oriented and normal today. Per protocol patients friend will take him to ER for evaluation. She states that he has agreed to go today. Will route note to office.  Reason for Disposition . Very strange or paranoid behavior  Answer Assessment - Initial Assessment Questions 1. LEVEL OF CONSCIOUSNESS: "How is he (she, the patient) acting right now?" (e.g., alert-oriented, confused, lethargic, stuporous, comatose)     Better today 2. ONSET: "When did the confusion start?"  (minutes, hours, days)     3 days until today 3. PATTERN "Does this come and go, or has it been constant since it started?"  "Is it present now?"    No now, comes and goes 4. ALCOHOL or DRUGS: "Has he been drinking alcohol or taking any drugs?"     alchol 5. NARCOTIC MEDICATIONS: "Has he been receiving any narcotic medications?" (e.g., morphine, Vicodin)    No 6. CAUSE: "What do you think is causing the confusion?"      Fall unsure if he hit head 7. OTHER SYMPTOMS: "Are there any other symptoms?" (e.g., difficulty breathing, headache, fever, weakness)     Back opain 3 back surgeries  Protocols used: CONFUSION - DELIRIUM-A-AH

## 2020-10-14 ENCOUNTER — Ambulatory Visit (INDEPENDENT_AMBULATORY_CARE_PROVIDER_SITE_OTHER): Payer: Medicare Other | Admitting: Internal Medicine

## 2020-10-14 ENCOUNTER — Encounter: Payer: Self-pay | Admitting: Internal Medicine

## 2020-10-14 ENCOUNTER — Other Ambulatory Visit: Payer: Self-pay

## 2020-10-14 VITALS — BP 128/72 | HR 72 | Temp 98.3°F | Resp 18 | Ht 69.0 in | Wt 200.4 lb

## 2020-10-14 DIAGNOSIS — R42 Dizziness and giddiness: Secondary | ICD-10-CM | POA: Diagnosis not present

## 2020-10-14 DIAGNOSIS — H9313 Tinnitus, bilateral: Secondary | ICD-10-CM | POA: Diagnosis not present

## 2020-10-14 MED ORDER — MECLIZINE HCL 25 MG PO TABS: 25.0000 mg | ORAL_TABLET | Freq: Three times a day (TID) | ORAL | 1 refills | Status: DC | PRN

## 2020-10-14 NOTE — Patient Instructions (Addendum)
A referral was placed to ENT, and you can call your ENT provider to help schedule an appointment.  A prescription for meclizine, a medicine that you can take 3 times a day as needed was sent to your pharmacy  Vertigo Vertigo is the feeling that you or the things around you are moving when they are not. This feeling can come and go at any time. Vertigo often goes away on its own. This condition can be dangerous if it happens when you are doing activities like driving or working with machines. Your doctor will do tests to find the cause of your vertigo. These tests will also help your doctor decide on the best treatment for you. Follow these instructions at home: Eating and drinking      Drink enough fluid to keep your pee (urine) pale yellow.  Do not drink alcohol. Activity  Return to your normal activities as told by your doctor. Ask your doctor what activities are safe for you.  In the morning, first sit up on the side of the bed. When you feel okay, stand slowly while you hold onto something until you know that your balance is fine.  Move slowly. Avoid sudden body or head movements or certain positions, as told by your doctor.  Use a cane if you have trouble standing or walking.  Sit down right away if you feel dizzy.  Avoid doing any tasks or activities that can cause danger to you or others if you get dizzy.  Avoid bending down if you feel dizzy. Place items in your home so that they are easy for you to reach without leaning over.  Do not drive or use heavy machinery if you feel dizzy. General instructions  Take over-the-counter and prescription medicines only as told by your doctor.  Keep all follow-up visits as told by your doctor. This is important. Contact a doctor if:  Your medicine does not help your vertigo.  You have a fever.  Your problems get worse or you have new symptoms.  Your family or friends see changes in your behavior.  The feeling of being sick  to your stomach gets worse.  Your vomiting gets worse.  You lose feeling (have numbness) in part of your body.  You feel prickling and tingling in a part of your body. Get help right away if:  You have trouble moving or talking.  You are always dizzy.  You pass out (faint).  You get very bad headaches.  You feel weak in your hands, arms, or legs.  You have changes in your hearing.  You have changes in how you see (vision).  You get a stiff neck.  Bright light starts to bother you. Summary  Vertigo is the feeling that you or the things around you are moving when they are not.  Your doctor will do tests to find the cause of your vertigo.  You may be told to avoid some tasks, positions, or movements.  Contact a doctor if your medicine is not helping, or if you have a fever, new symptoms, or a change in behavior.  Get help right away if you get very bad headaches, or if you have changes in how you speak, hear, or see. This information is not intended to replace advice given to you by your health care provider. Make sure you discuss any questions you have with your health care provider. Document Revised: 09/09/2018 Document Reviewed: 09/09/2018 Elsevier Patient Education  2020 [employer].

## 2020-10-14 NOTE — Progress Notes (Signed)
Patient ID: Joel Bryant, male    DOB: Sep 25, 1961, 59 y.o.   MRN: [id number]  PCP: Steele Sizer, MD  Chief Complaint  Patient presents with  . Dizziness    Ringing in ears for 2 weeks    Subjective:   Joel Bryant is a 59 y.o. male, presents to clinic with CC of the following:  Chief Complaint  Patient presents with  . Dizziness    Ringing in ears for 2 weeks    HPI:  Patient is a 59 year old male patient of Dr. Ancil Boozer Last visit with her was 09/03/2020 On that visit his medications for diabetes were stopped as he was very well controlled  Follows up today with complaints of dizziness  He notes his symptoms started 2 weeks ago, was intermittent when symptoms started.  Over time, the symptoms have become more prominent, and more constant. Notes when having symptoms, is room spinning, things moving around him, not simply feeling lightheaded Also notes a "chirping "sound in his ears, like a ringing in his ears, more left than right-sided, and extremely bothersome in combination with the dizziness.  Denies any hearing loss. Often worsens with head movements Denied any nausea or vomiting No recent URI sx's, no fever, no PND/congestion No passing out episodes, no CP, palpitations, SOB No one sided symptoms of concern with no right or left arm weakness or numbness or right or left lower extremity weakness or numbness.  No facial droop, no loss of sensation in the face.  No vision changes of concern. No h/o of this in past He noted a PA came out on Monday to his house to evaluate, and thought it was vertigo. He took a Dramamine product yesterday and it did help some, just for a short period of time.  Only takes Tylenol otherwise as needed. He does take a baby aspirin a day, denies any further aspirin use  He has seen [facility] in the recent past who referred him to GI for swallowing difficulties, and just had an imaging study done and was unremarkable per his  report.  He notes his sugars have been well controlled at home on recent checks.  He is off his medicines that he was previously on for diabetes as noted above due to evidence of excellent control.  Patient Active Problem List   Diagnosis Date Noted  . History of drug abuse in remission ([place]) 04/30/2020  . Closed compression fracture of thoracic vertebra ([place]) 06/13/2018  . Fatty liver 06/13/2018  . Aortic atherosclerosis ([place]) 06/13/2018  . Chronic pain syndrome 09/06/2016  . Dyslipidemia associated with type 2 diabetes mellitus ([place]) 09/27/2015  . H/O adenomatous polyp of colon 09/27/2015  . Dysmetabolic syndrome 45/[id number]/[id number]  . Post Laminectomy Syndrome (L4-5 Hemilaminectomy and partial Facetectomy) 09/27/2015  . Lumbar spondylosis 09/27/2015  . 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 Epidural fibrosis 09/27/2015  . Chronic low back pain (Location of Primary Source of Pain) (Bilateral) (L>R) 09/27/2015  . Chronic left knee pain 09/27/2015  . Lumbar spinal stenosis (L4-5) 09/27/2015  . Lumbar foraminal stenosis (Bilateral L3-4 and L4-5) 09/27/2015  . Lumbar facet hypertrophy (Bilateral, Multilevel) 09/27/2015  . Lumbar facet syndrome (Bilateral) 09/27/2015  . Bilateral hip osteonecrosis ([place]) (Bilateral Femoral Head) 09/27/2015  . Chronic hip pain (Location of Secondary source of pain) (Bilateral) (L>R) (secondary to osteonecrosis) 09/27/2015  . Edema leg 09/13/2015  . Gout 09/13/2015  . Obstructive apnea 09/13/2015  . Anxiety disorder 06/11/2015  . Hypertriglyceridemia 06/11/2015  .  Dyslipidemia 06/11/2015  . Controlled type 2 diabetes mellitus with diabetic neuropathy, without long-term current use of insulin ([place]) 06/11/2015  . 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 Retinitis pigmentosa 06/11/2015      Current Outpatient Medications:  .  aspirin 81 MG tablet, Take 81 mg by mouth every evening. , Disp: , Rfl:  .  Aspirin-Calcium Carbonate 81-777 MG TABS, Take 1  tablet by mouth daily., Disp: , Rfl:  .  atenolol (TENORMIN) 50 MG tablet, Take 1 tablet (50 mg total) by mouth daily., Disp: 90 tablet, Rfl: 1 .  atorvastatin (LIPITOR) 20 MG tablet, Take 1 tablet (20 mg total) by mouth daily at 6 PM., Disp: 90 tablet, Rfl: 1 .  esomeprazole (NEXIUM) 40 MG capsule, Take 1 capsule (40 mg total) by mouth daily., Disp: 90 capsule, Rfl: 1 .  furosemide (LASIX) 40 MG tablet, Take 1 tablet (40 mg total) by mouth daily as needed for fluid., Disp: 90 tablet, Rfl: 0 .  lisinopril (ZESTRIL) 2.5 MG tablet, Take 1 tablet (2.5 mg total) by mouth daily., Disp: 90 tablet, Rfl: 1 .  omega-3 acid ethyl esters (LOVAZA) 1 g capsule, Take 2 capsules (2 g total) by mouth 2 (two) times daily., Disp: 360 capsule, Rfl: 1 .  ACCU-CHEK SOFTCLIX LANCETS lancets, USE AS DIRECTED TO CHECK BLOOD SUGAR TWICE A DAY (Patient not taking: Reported on 10/14/2020), Disp: 100 each, Rfl: 2   No Known Allergies   Past Surgical History:  Procedure Laterality Date  . BACK SURGERY  2001   laminectomy lumbar spine  . CATARACT EXTRACTION  1992   Insert Prosthetic Lens  . COLONOSCOPY WITH PROPOFOL N/A 02/28/2017   Procedure: COLONOSCOPY WITH PROPOFOL;  Surgeon: Manya Silvas, MD;  Location: [facility] ENDOSCOPY;  Service: Endoscopy;  Laterality: N/A;  . COLONOSCOPY WITH PROPOFOL N/A 06/03/2020   Procedure: COLONOSCOPY WITH PROPOFOL;  Surgeon: [name], MD;  Location: [facility] ENDOSCOPY;  Service: Endoscopy;  Laterality: N/A;  . EYE SURGERY    . KYPHOPLASTY N/A 08/08/2018   Procedure: [id number];  Surgeon: Hessie Knows, MD;  Location: [facility] ORS;  Service: Orthopedics;  Laterality: N/A;  . LAMINECTOMY  2001   lumbar  . MULTIPLE TOOTH EXTRACTIONS     2018  . TIBIA FRACTURE SURGERY    . TONSILLECTOMY    . WRIST SURGERY Right    x2     Family History  Problem Relation Age of Onset  . Colon polyps Mother   . Retinitis pigmentosa Mother   . Retinitis pigmentosa Maternal Grandmother   .  Cancer Maternal Grandfather   . Diabetes Paternal Grandmother   . Alcohol abuse Father      Social History   Tobacco Use  . Smoking status: Former Smoker    Types: Cigarettes  . Smokeless tobacco: Never Used  . Tobacco comment: Quit smoking 4 years now  Substance Use Topics  . Alcohol use: Yes    Alcohol/week: 0.0 standard drinks    Comment: 10 beer today    With staff assistance, above reviewed with the patient today.  ROS: As per HPI, otherwise no specific complaints on a limited and focused system review   No results found for this or any previous visit (from the past 72 hour(s)).   PHQ2/9: Depression screen [facility] 2/9 10/14/2020 09/03/2020 09/16/2018 06/13/2018 03/12/2018  Decreased Interest 0 0 0 0 0  Down, Depressed, Hopeless 0 0 0 0 0  PHQ - 2 Score 0 0 0 0 0  Altered  sleeping - - 0 - -  Tired, decreased energy - - 0 - -  Change in appetite - - 0 - -  Feeling bad or failure about yourself  - - 0 - -  Trouble concentrating - - 0 - -  Moving slowly or fidgety/restless - - 0 - -  Suicidal thoughts - - 0 - -  PHQ-9 Score - - 0 - -  Difficult doing work/chores - - Not difficult at all - -   PHQ-2/9 Result is neg  Fall Risk: Fall Risk  10/14/2020 09/03/2020 09/16/2018 06/13/2018 03/12/2018  Falls in the past year? 1 0 1 Yes No  Number falls in past yr: 0 0 0 1 -  Injury with Fall? 0 0 1 Yes -  Follow up Falls evaluation completed - - - -      Objective:   Vitals:   10/14/20 0842  BP: 128/72  Pulse: 72  Resp: 18  Temp: 98.3 F (36.8 C)  TempSrc: Oral  SpO2: 97%  Weight: 200 lb 6.4 oz (90.9 kg)  Height: 5\' 9"  (1.753 m)    Body mass index is 29.59 kg/m.  Physical Exam   NAD, masked, sitting in a chair and bracing himself on the counter when arrived, and when stood up to help to the exam table, took time to steady himself, and was able to get to the exam table moving very carefully due to his symptoms. HEENT - [place]/AT, sclera anicteric, PERRL, EOMI with a  faint horizontal nystagmus noted as approaching end gaze of lateral movement, not marked, no rotary nystagmus, conj - non-inj'ed, no focal sinus tenderness, TM's and canals clear with good light reflexes bilateral, no tenderness tugging on the lobes, pharynx clear Increased with head movements in the office. Neck - supple, no adenopathy, carotids 2+ and = without bruits bilat Car - RRR without m/g/r, not tachycardic Pulm- RR and effort normal at rest, CTA without wheeze or rales Abd - soft, NT diffusely, ND,  Ext - no LE edema,  Neuro/psychiatric - affect was not flat, appropriate with conversation  Alert, cranial nerves II through XII intact with acuity not tested, no facial droop, no tongue deviation, good sensation to touch on the face with adequate facial muscle strength  Grossly non-focal - good strength on testing extremities, sensation intact to LT in distal extremities  Speech normal   Results for orders placed or performed in visit on 09/03/20  Lipid panel  Result Value Ref Range   Cholesterol 181 <200 mg/dL   HDL 50 > OR = 40 mg/dL   Triglycerides 414 (H) <150 mg/dL   LDL Cholesterol (Calc)  mg/dL (calc)   Total CHOL/HDL Ratio 3.6 <5.0 (calc)   Non-HDL Cholesterol (Calc) 131 (H) <130 mg/dL (calc)  Microalbumin / creatinine urine ratio  Result Value Ref Range   Creatinine, Urine 223 20 - 320 mg/dL   Microalb, Ur 50.9 mg/dL   Microalb Creat Ratio 228 (H) <30 mcg/mg creat  COMPLETE METABOLIC PANEL WITH GFR  Result Value Ref Range   Glucose, Bld 93 65 - 99 mg/dL   BUN 10 7 - 25 mg/dL   Creat 0.71 0.70 - 1.25 mg/dL   GFR, Est Non African American 102 > OR = 60 mL/min/1.17m2   GFR, Est African American 118 > OR = 60 mL/min/1.88m2   BUN/Creatinine Ratio NOT APPLICABLE 6 - 22 (calc)   Sodium 138 135 - 146 mmol/L   Potassium 4.0 3.5 - 5.3 mmol/L  Chloride 99 98 - 110 mmol/L   CO2 32 20 - 32 mmol/L   Calcium 9.6 8.6 - 10.3 mg/dL   Total Protein 7.5 6.1 - 8.1 g/dL   Albumin  4.8 3.6 - 5.1 g/dL   Globulin 2.7 1.9 - 3.7 g/dL (calc)   AG Ratio 1.8 1.0 - 2.5 (calc)   Total Bilirubin 0.9 0.2 - 1.2 mg/dL   Alkaline phosphatase (APISO) 76 35 - 144 U/L   AST 81 (H) 10 - 35 U/L   ALT 32 9 - 46 U/L  CBC with Differential/Platelet  Result Value Ref Range   WBC 6.9 3.8 - 10.8 Thousand/uL   RBC 5.36 4.20 - 5.80 Million/uL   Hemoglobin 17.6 (H) 13.2 - 17.1 g/dL   HCT 50.1 (H) 38.5 - 50.0 %   MCV 93.5 80.0 - 100.0 fL   MCH 32.8 27.0 - 33.0 pg   MCHC 35.1 32.0 - 36.0 g/dL   RDW 12.7 11.0 - 15.0 %   Platelets 152 140 - 400 Thousand/uL   MPV 10.4 7.5 - 12.5 fL   Neutro Abs 4,161 1,500 - 7,800 cells/uL   Lymphs Abs 1,877 850 - 3,900 cells/uL   Absolute Monocytes 738 200 - 950 cells/uL   Eosinophils Absolute 83 15 - 500 cells/uL   Basophils Absolute 41 0 - 200 cells/uL   Neutrophils Relative % 60.3 %   Total Lymphocyte 27.2 %   Monocytes Relative 10.7 %   Eosinophils Relative 1.2 %   Basophils Relative 0.6 %  Hepatitis C antibody  Result Value Ref Range   Hepatitis C Ab REACTIVE (A) NON-REACTI   SIGNAL TO CUT-OFF 33.00 (H) <1.00  TSH  Result Value Ref Range   TSH 0.66 0.40 - 4.50 mIU/L  HIV Antibody (routine testing w rflx)  Result Value Ref Range   HIV 1&2 Ab, 4th Generation NON-REACTIVE NON-REACTI  PSA  Result Value Ref Range   PSA 2.45 < OR = 4.0 ng/mL  HCV RNA, Quantitative Real Time PCR  Result Value Ref Range   HCV RNA, PCR, QN <15 NOT DETECTED NOT DETECT IU/mL   HCV Quantitative Log <1.18 NOT DETECTED NOT DETECT Log IU/mL  POCT HgB A1C  Result Value Ref Range   Hemoglobin A1C 5.2 4.0 - 5.6 %   HbA1c POC (<> result, manual entry)     HbA1c, POC (prediabetic range)     HbA1c, POC (controlled diabetic range)         Assessment & Plan:   1. Vertigo 2. Tinnitus of both ears Discussed with patient the likely diagnosis of vertigo, and some concerns noting this chirping sound in the ear like a tinnitus.  More left sided.  No unilateral hearing loss  evident.  Noted there are some more benign causes of vertigo, and some more concerning causes like a vestibular neuroma, and concerns noted with the tinnitus as well as the symptoms gradually increasing over the last couple weeks.  No one-sided symptoms of concern and no vision changes noted. We will add a meclizine product-can use 3 times a day as needed and warned may make drowsy.  Noted cannot drive with the symptoms nor while taking the medicine, and he noted that is not an issue. Can use Tylenol products for any discomfort as needed. He has seen ENT recently, and felt best to get them involved sooner than later and get their opinion and a referral was placed today for his insurance, and he states he can call their office  and request a follow-up with them, and he noted he will do so today.  Discussed possibly obtaining an MRI if symptoms not improving or more problematic, and await further input from ENT. Emphasized if his symptoms are worsening further with any vomiting, vision changes, one-sided symptoms of concern, or just becoming more problematic, the importance of being seen more emergently in an ER setting and he was understanding of that.  Can follow-up if symptoms not improving over time as well as await ENT input.  - meclizine (ANTIVERT) 25 MG tablet; Take 1 tablet (25 mg total) by mouth 3 (three) times daily as needed for dizziness. May make drowsy  Dispense: 30 tablet; Refill: 1 - Ambulatory referral to ENT       Towanda Malkin, MD 10/14/20 9:00 AM

## 2020-10-15 ENCOUNTER — Telehealth: Payer: Self-pay

## 2020-10-15 NOTE — Telephone Encounter (Signed)
Copied from [place] [id number]. Topic: General - Other >> Oct 15, 2020 11:05 AM Rainey Pines A wrote: Patient would like a callback from nurse in regards to medication prescribed by Dr. Roxan Hockey yesterday as soon as possible. Patient wants to speak with nurse about side effects. Please Arnoldo Hooker

## 2020-10-15 NOTE — Telephone Encounter (Signed)
I did let him know that it will make him feel drowsy. Please let him know can take 1/2 a pill as needed and see how he does with that.   Thanks

## 2020-10-15 NOTE — Telephone Encounter (Signed)
Called the patient and he said that the meclizine made him feel like he was on acid. He said he slept 16 hours after he took his medication. He said things seem to have calmed down about 30 minutes ago. He is wondering if this is normal and should he continue taking this medication.

## 2020-10-18 ENCOUNTER — Other Ambulatory Visit: Payer: Self-pay | Admitting: Otolaryngology

## 2020-10-18 DIAGNOSIS — H9312 Tinnitus, left ear: Secondary | ICD-10-CM | POA: Diagnosis not present

## 2020-10-18 DIAGNOSIS — H903 Sensorineural hearing loss, bilateral: Secondary | ICD-10-CM | POA: Diagnosis not present

## 2020-10-18 DIAGNOSIS — R42 Dizziness and giddiness: Secondary | ICD-10-CM

## 2020-10-26 ENCOUNTER — Ambulatory Visit
Admission: RE | Admit: 2020-10-26 | Discharge: 2020-10-26 | Disposition: A | Discharge status: Home / Self Care | Payer: Medicare Other | Source: Ambulatory Visit | Attending: Otolaryngology | Admitting: Otolaryngology

## 2020-10-26 ENCOUNTER — Other Ambulatory Visit: Payer: Self-pay

## 2020-10-26 DIAGNOSIS — H903 Sensorineural hearing loss, bilateral: Secondary | ICD-10-CM

## 2020-10-26 DIAGNOSIS — R42 Dizziness and giddiness: Secondary | ICD-10-CM | POA: Diagnosis not present

## 2020-10-26 DIAGNOSIS — R609 Edema, unspecified: Secondary | ICD-10-CM | POA: Diagnosis not present

## 2020-10-26 MED ORDER — GADOBUTROL 1 MMOL/ML IV SOLN
9.0000 mL | Freq: Once | INTRAVENOUS | Status: AC | PRN
  Administered 2020-10-26: 9 mL via INTRAVENOUS

## 2020-10-27 ENCOUNTER — Ambulatory Visit: Payer: Medicare Other | Admitting: Gastroenterology

## 2020-10-28 ENCOUNTER — Ambulatory Visit (INDEPENDENT_AMBULATORY_CARE_PROVIDER_SITE_OTHER): Payer: Medicare Other

## 2020-10-28 DIAGNOSIS — Z0000 Encounter for general adult medical examination without abnormal findings: Secondary | ICD-10-CM

## 2020-10-28 NOTE — Patient Instructions (Signed)
Joel Bryant , Thank you for taking time to come for your Medicare Wellness Visit. I appreciate your ongoing commitment to your health goals. Please review the following plan we discussed and let me know if I can assist you in the future.   Screening recommendations/referrals: Colonoscopy: done 06/03/20. Repeat in 2022 Recommended yearly ophthalmology/optometry visit for glaucoma screening and checkup Recommended yearly dental visit for hygiene and checkup  Vaccinations: Influenza vaccine: done 08/03/20 Pneumococcal vaccine: done 09/15/13 Tdap vaccine: done 09/11/16 Shingles vaccine: please provide vaccine information from [facility] if available   Covid-19: done 02/18/20, 03/17/20 & 09/14/20  Advanced directives: Advance directive discussed with you today. I have provided a copy for you to complete at home and have notarized. Once this is complete please bring a copy in to our office so we can scan it into your chart.  Conditions/risks identified: Recommend increasing physical activity as tolerated  Next appointment: Follow up in one year for your annual wellness visit   Preventive Care 40-64 Years, Male Preventive care refers to lifestyle choices and visits with your health care provider that can promote health and wellness. What does preventive care include?  A yearly physical exam. This is also called an annual well check.  Dental exams once or twice a year.  Routine eye exams. Ask your health care provider how often you should have your eyes checked.  Personal lifestyle choices, including:  Daily care of your teeth and gums.  Regular physical activity.  Eating a healthy diet.  Avoiding tobacco and drug use.  Limiting alcohol use.  Practicing safe sex.  Taking low-dose aspirin every day starting at age 45. What happens during an annual well check? The services and screenings done by your health care provider during your annual well check will depend on your age,  overall health, lifestyle risk factors, and family history of disease. Counseling  Your health care provider may ask you questions about your:  Alcohol use.  Tobacco use.  Drug use.  Emotional well-being.  Home and relationship well-being.  Sexual activity.  Eating habits.  Work and work Astronomer. Screening  You may have the following tests or measurements:  Height, weight, and BMI.  Blood pressure.  Lipid and cholesterol levels. These may be checked every 5 years, or more frequently if you are over 78 years old.  Skin check.  Lung cancer screening. You may have this screening every year starting at age 61 if you have a 30-pack-year history of smoking and currently smoke or have quit within the past 15 years.  Fecal occult blood test (FOBT) of the stool. You may have this test every year starting at age 40.  Flexible sigmoidoscopy or colonoscopy. You may have a sigmoidoscopy every 5 years or a colonoscopy every 10 years starting at age 40.  Prostate cancer screening. Recommendations will vary depending on your family history and other risks.  Hepatitis C blood test.  Hepatitis B blood test.  Sexually transmitted disease (STD) testing.  Diabetes screening. This is done by checking your blood sugar (glucose) after you have not eaten for a while (fasting). You may have this done every 1-3 years. Discuss your test results, treatment options, and if necessary, the need for more tests with your health care provider. Vaccines  Your health care provider may recommend certain vaccines, such as:  Influenza vaccine. This is recommended every year.  Tetanus, diphtheria, and acellular pertussis (Tdap, Td) vaccine. You may need a Td booster every 10 years.  Zoster  vaccine. You may need this after age 65.  Pneumococcal 13-valent conjugate (PCV13) vaccine. You may need this if you have certain conditions and have not been vaccinated.  Pneumococcal polysaccharide (PPSV23)  vaccine. You may need one or two doses if you smoke cigarettes or if you have certain conditions. Talk to your health care provider about which screenings and vaccines you need and how often you need them. This information is not intended to replace advice given to you by your health care provider. Make sure you discuss any questions you have with your health care provider. Document Released: 11/12/2015 Document Revised: 07/05/2016 Document Reviewed: 08/17/2015 Elsevier Interactive Patient Education  2017 [place] Prevention in the Home Falls can cause injuries. They can happen to people of all ages. There are many things you can do to make your home safe and to help prevent falls. What can I do on the outside of my home?  Regularly fix the edges of walkways and driveways and fix any cracks.  Remove anything that might make you trip as you walk through a door, such as a raised step or threshold.  Trim any bushes or trees on the path to your home.  Use bright outdoor lighting.  Clear any walking paths of anything that might make someone trip, such as rocks or tools.  Regularly check to see if handrails are loose or broken. Make sure that both sides of any steps have handrails.  Any raised decks and porches should have guardrails on the edges.  Have any leaves, snow, or ice cleared regularly.  Use sand or salt on walking paths during winter.  Clean up any spills in your garage right away. This includes oil or grease spills. What can I do in the bathroom?  Use night lights.  Install grab bars by the toilet and in the tub and shower. Do not use towel bars as grab bars.  Use non-skid mats or decals in the tub or shower.  If you need to sit down in the shower, use a plastic, non-slip stool.  Keep the floor dry. Clean up any water that spills on the floor as soon as it happens.  Remove soap buildup in the tub or shower regularly.  Attach bath mats securely with  double-sided non-slip rug tape.  Do not have throw rugs and other things on the floor that can make you trip. What can I do in the bedroom?  Use night lights.  Make sure that you have a light by your bed that is easy to reach.  Do not use any sheets or blankets that are too big for your bed. They should not hang down onto the floor.  Have a firm chair that has side arms. You can use this for support while you get dressed.  Do not have throw rugs and other things on the floor that can make you trip. What can I do in the kitchen?  Clean up any spills right away.  Avoid walking on wet floors.  Keep items that you use a lot in easy-to-reach places.  If you need to reach something above you, use a strong step stool that has a grab bar.  Keep electrical cords out of the way.  Do not use floor polish or wax that makes floors slippery. If you must use wax, use non-skid floor wax.  Do not have throw rugs and other things on the floor that can make you trip. What can I do with my  stairs?  Do not leave any items on the stairs.  Make sure that there are handrails on both sides of the stairs and use them. Fix handrails that are broken or loose. Make sure that handrails are as long as the stairways.  Check any carpeting to make sure that it is firmly attached to the stairs. Fix any carpet that is loose or worn.  Avoid having throw rugs at the top or bottom of the stairs. If you do have throw rugs, attach them to the floor with carpet tape.  Make sure that you have a light switch at the top of the stairs and the bottom of the stairs. If you do not have them, ask someone to add them for you. What else can I do to help prevent falls?  Wear shoes that:  Do not have high heels.  Have rubber bottoms.  Are comfortable and fit you well.  Are closed at the toe. Do not wear sandals.  If you use a stepladder:  Make sure that it is fully opened. Do not climb a closed stepladder.  Make  sure that both sides of the stepladder are locked into place.  Ask someone to hold it for you, if possible.  Clearly mark and make sure that you can see:  Any grab bars or handrails.  First and last steps.  Where the edge of each step is.  Use tools that help you move around (mobility aids) if they are needed. These include:  Canes.  Walkers.  Scooters.  Crutches.  Turn on the lights when you go into a dark area. Replace any light bulbs as soon as they burn out.  Set up your furniture so you have a clear path. Avoid moving your furniture around.  If any of your floors are uneven, fix them.  If there are any pets around you, be aware of where they are.  Review your medicines with your doctor. Some medicines can make you feel dizzy. This can increase your chance of falling. Ask your doctor what other things that you can do to help prevent falls. This information is not intended to replace advice given to you by your health care provider. Make sure you discuss any questions you have with your health care provider. Document Released: 08/12/2009 Document Revised: 03/23/2016 Document Reviewed: 11/20/2014 Elsevier Interactive Patient Education  2017 [employer].

## 2020-10-28 NOTE — Progress Notes (Signed)
Subjective:   Joel Bryant is a 59 y.o. male who presents for an Initial Medicare Annual Wellness Visit.  Virtual Visit via Telephone Note  I connected with  Despina Arias on 10/28/20 at 10:40 AM EST by telephone and verified that I am speaking with the correct person using two identifiers.  Location: Patient: home Provider: [place] Persons participating in the virtual visit: [place]   I discussed the limitations, risks, security and privacy concerns of performing an evaluation and management service by telephone and the availability of in person appointments. The patient expressed understanding and agreed to proceed.  Interactive audio and video telecommunications were attempted between this nurse and patient, however failed, due to patient having technical difficulties OR patient did not have access to video capability.  We continued and completed visit with audio only.  Some vital signs may be absent or patient reported.   Clemetine Marker, LPN    Review of Systems     Cardiac Risk Factors include: advanced age (>52men, >65 women);male gender;dyslipidemia;hypertension;sedentary lifestyle;smoking/ tobacco exposure     Objective:    There were no vitals filed for this visit. There is no height or weight on file to calculate BMI.  Advanced Directives 10/28/2020 06/03/2020 03/20/2020 07/25/2018 05/22/2018 06/14/2017 06/12/2017  Does Patient Have a Medical Advance Directive? No No No No No No No  Would patient like information on creating a medical advance directive? Yes (MAU/Ambulatory/Procedural Areas - Information given) No - Patient declined No - Patient declined - No - Patient declined - -    Current Medications (verified) Outpatient Encounter Medications as of 10/28/2020  Medication Sig  . atenolol (TENORMIN) 50 MG tablet Take 1 tablet (50 mg total) by mouth daily.  Marland Kitchen atorvastatin (LIPITOR) 20 MG tablet Take 1 tablet (20 mg total) by mouth daily at 6 PM.   . Calcium Carb-Cholecalciferol (CALCIUM 500 + D) 500-200 MG-UNIT TABS Take by mouth.  . esomeprazole (NEXIUM) 40 MG capsule Take 1 capsule (40 mg total) by mouth daily.  . furosemide (LASIX) 40 MG tablet Take 1 tablet (40 mg total) by mouth daily as needed for fluid.  Marland Kitchen lisinopril (ZESTRIL) 2.5 MG tablet Take 1 tablet (2.5 mg total) by mouth daily.  . meclizine (ANTIVERT) 25 MG tablet Take 1 tablet (25 mg total) by mouth 3 (three) times daily as needed for dizziness. May make drowsy  . omega-3 acid ethyl esters (LOVAZA) 1 g capsule Take 2 capsules (2 g total) by mouth 2 (two) times daily.  Marland Kitchen aspirin 81 MG tablet Take 81 mg by mouth every evening.  (Patient not taking: Reported on 10/28/2020)  . [DISCONTINUED] ACCU-CHEK SOFTCLIX LANCETS lancets USE AS DIRECTED TO CHECK BLOOD SUGAR TWICE A DAY (Patient not taking: Reported on 10/14/2020)  . [DISCONTINUED] Aspirin-Calcium Carbonate 81-777 MG TABS Take 1 tablet by mouth daily.   No facility-administered encounter medications on file as of 10/28/2020.    Allergies (verified) Patient has no known allergies.   History: Past Medical History:  Diagnosis Date  . Anxiety   . Aortic atherosclerosis ([place]) 06/13/2018   July 2019 CT scan  . Chronic LBP 09/13/2015  . Chronic left hip pain 09/27/2015  . Diabetes mellitus without complication ([place])   . Fracture, vertebra, pathologic   . Gout   . Hyperlipidemia   . Hypertension   . Hypomagnesemia   . Retinitis pigmentosa   . Sleep apnea    Past Surgical History:  Procedure Laterality Date  . BACK SURGERY  2001   laminectomy lumbar spine  . CATARACT EXTRACTION  1992   Insert Prosthetic Lens  . COLONOSCOPY WITH PROPOFOL N/A 02/28/2017   Procedure: COLONOSCOPY WITH PROPOFOL;  Surgeon: Scot Jun, MD;  Location: [facility] ENDOSCOPY;  Service: Endoscopy;  Laterality: N/A;  . COLONOSCOPY WITH PROPOFOL N/A 06/03/2020   Procedure: COLONOSCOPY WITH PROPOFOL;  Surgeon: Regis Bill, MD;  Location:  [facility] ENDOSCOPY;  Service: Endoscopy;  Laterality: N/A;  . EYE SURGERY    . KYPHOPLASTY N/A 08/08/2018   Procedure: [id number];  Surgeon: Kennedy Bucker, MD;  Location: [facility] ORS;  Service: Orthopedics;  Laterality: N/A;  . LAMINECTOMY  2001   lumbar  . MULTIPLE TOOTH EXTRACTIONS     2018  . TIBIA FRACTURE SURGERY    . TONSILLECTOMY    . WRIST SURGERY Right    x2   Family History  Problem Relation Age of Onset  . Colon polyps Mother   . Retinitis pigmentosa Mother   . Retinitis pigmentosa Maternal Grandmother   . Cancer Maternal Grandfather   . Diabetes Paternal Grandmother   . Alcohol abuse Father    Social History   Socioeconomic History  . Marital status: Divorced    Spouse name: Not on file  . Number of children: 2  . Years of education: Not on file  . Highest education level: Not on file  Occupational History  . Not on file  Tobacco Use  . Smoking status: Current Some Day Smoker    Types: Cigarettes  . Smokeless tobacco: Never Used  . Tobacco comment: Quit smoking 4 years now; smokes cigarettes every now and then  Vaping Use  . Vaping Use: Never used  Substance and Sexual Activity  . Alcohol use: Yes    Alcohol/week: 0.0 standard drinks    Comment: 10 beer today  . Drug use: Yes    Types: Marijuana    Comment: Cocaine use previously; marijuana currently occasionally  . Sexual activity: Not Currently  Other Topics Concern  . Not on file  Social History Narrative   Pt lives alone mostly but sometimes has room mate   Social Determinants of Health   Financial Resource Strain: Low Risk   . Difficulty of Paying Living Expenses: Not very hard  Food Insecurity: No Food Insecurity  . Worried About Programme researcher, broadcasting/film/video in the Last Year: Never true  . Ran Out of Food in the Last Year: Never true  Transportation Needs: No Transportation Needs  . Lack of Transportation (Medical): No  . Lack of Transportation (Non-Medical): No  Physical Activity: Inactive  .  Days of Exercise per Week: 0 days  . Minutes of Exercise per Session: 0 min  Stress: Stress Concern Present  . Feeling of Stress : Rather much  Social Connections: Socially Isolated  . Frequency of Communication with Friends and Family: More than three times a week  . Frequency of Social Gatherings with Friends and Family: More than three times a week  . Attends Religious Services: Never  . Active Member of Clubs or Organizations: No  . Attends Banker Meetings: Never  . Marital Status: Divorced    Tobacco Counseling Ready to quit: Not Answered Counseling given: Not Answered Comment: Quit smoking 4 years now; smokes cigarettes every now and then   Clinical Intake:  Pre-visit preparation completed: Yes  Pain : No/denies pain     Nutritional Risks: None Diabetes: Yes CBG done?: No Did pt. bring in CBG monitor from home?: No  How often do you need to have someone help you when you read instructions, pamphlets, or other written materials from your doctor or pharmacy?: 1 - Never  Nutrition Risk Assessment:  Has the patient had any N/V/D within the last 2 months?  No  Does the patient have any non-healing wounds?  No  Has the patient had any unintentional weight loss or weight gain?  No   Diabetes:  Is the patient diabetic?  Yes  If diabetic, was a CBG obtained today?  No  Did the patient bring in their glucometer from home?  No  How often do you monitor your CBG's? Pt does not actively check blood sugar .   Financial Strains and Diabetes Management:  Are you having any financial strains with the device, your supplies or your medication? No .  Does the patient want to be seen by Chronic Care Management for management of their diabetes?  No  Would the patient like to be referred to a Nutritionist or for Diabetic Management?  No   Diabetic Exams:  Diabetic Eye Exam: Completed per patient; will need to request records from [facility].   Diabetic  Foot Exam: Completed 09/03/20.  Interpreter Needed?: No  Information entered by :: Clemetine Marker LPN   Activities of Daily Living In your present state of health, do you have any difficulty performing the following activities: 10/28/2020 10/14/2020  Hearing? N N  Comment declines hearing aids -  Vision? Y Y  Difficulty concentrating or making decisions? N N  Walking or climbing stairs? N N  Dressing or bathing? N N  Doing errands, shopping? N N  Preparing Food and eating ? N -  Using the Toilet? N -  In the past six months, have you accidently leaked urine? N -  Do you have problems with loss of bowel control? N -  Managing your Medications? N -  Managing your Finances? N -  Housekeeping or managing your Housekeeping? N -  Some recent data might be hidden    Patient Care Team: Steele Sizer, MD as PCP - General (Family Medicine) Marin Olp, PA-C as Physician Assistant (Neurosurgery)  Indicate any recent Medical Services you may have received from other than [facility] providers in the past year (date may be approximate).     Assessment:   This is a routine wellness examination for Jesean.  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 1 1 0 1 Yes  Number falls in past yr: 0 0 0 0 1  Injury with Fall? 0 0 0 1 Yes  Risk for fall due to : History of fall(s);Impaired vision - - - -  Follow up Falls prevention discussed Falls evaluation completed - - -    FALL RISK PREVENTION PERTAINING TO THE HOME:  Any stairs in or around the home? Yes  If so, are there any without handrails? No  Home free of loose throw rugs in walkways, pet beds, electrical cords, etc? Yes  Adequate lighting in your home to reduce risk of falls? Yes   ASSISTIVE DEVICES UTILIZED TO PREVENT FALLS:  Life alert? No  Use of a cane, walker or w/c? No  Grab bars in the bathroom? Yes  Shower chair or bench in shower? No  Elevated toilet seat or a handicapped toilet? Yes   TIMED UP AND GO:  Was the test performed? No . Telephonic visit.   Cognitive Function: pt declined 6CIT; states no memory issues.         Immunizations Immunization History  Administered Date(s) Administered  . Influenza Split 08/01/2011  . Influenza, Seasonal, Injecte, Preservative Fre 07/11/2012  . Influenza,inj,Quad PF,6+ Mos 08/13/2013, 09/13/2015, 09/07/2016, 09/19/2018  . Influenza-Unspecified 08/10/2017, 08/03/2020  . Moderna Sars-Covid-2 Vaccination 02/18/2020, 03/17/2020, 09/14/2020  . Pneumococcal Polysaccharide-23 09/15/2013  . Tdap 09/11/2016    TDAP status: Up to date  Flu Vaccine status: Up to date  Pneumococcal vaccine status: Up to date  Covid-19 vaccine status: Completed vaccines  Qualifies for Shingles Vaccine? Yes   Zostavax completed No   Shingrix Completed?: Yes - will contact [place] for vaccine information  Screening Tests Health Maintenance  Topic Date Due  . OPHTHALMOLOGY EXAM  04/29/2018  . HEMOGLOBIN A1C  03/03/2021  . COLONOSCOPY (Pts 45-78yrs Insurance coverage will need to be confirmed)  06/03/2021  . FOOT EXAM  09/03/2021  . TETANUS/TDAP  09/11/2026  . INFLUENZA VACCINE  Completed   . PNEUMOCOCCAL POLYSACCHARIDE VACCINE AGE 100-64 HIGH RISK  Completed  . COVID-19 Vaccine  Completed  . Hepatitis C Screening  Completed  . HIV Screening  Completed    Health Maintenance  Health Maintenance Due  Topic Date Due  . OPHTHALMOLOGY EXAM  04/29/2018    Colorectal cancer screening: Type of screening: Colonoscopy. Completed 06/03/20. Repeat every 1 years  Lung Cancer Screening: (Low Dose CT Chest recommended if Age 63-80 years, 30 pack-year currently smoking OR have quit w/in 15years.) does not qualify.   Additional Screening:  Hepatitis C Screening: does qualify; Completed 09/03/20  Vision Screening: Recommended annual ophthalmology exams for early detection of glaucoma and other disorders of the eye. Is the patient up to date with their annual eye exam?  Yes  Who is the provider or what is the name of the office in which the patient attends annual eye exams? [place] Screening: Recommended annual dental exams for proper oral hygiene  Community Resource Referral / Chronic Care Management: CRR required this visit?  No   CCM required this visit?  No      Plan:     I have personally reviewed and noted the following in the patient's chart:   . Medical and social history . Use of alcohol, tobacco or illicit drugs  . Current medications and supplements . Functional ability and status . Nutritional status . Physical activity . Advanced directives . List of other physicians . Hospitalizations, surgeries, and ER visits in previous 12 months . Vitals . Screenings to include cognitive, depression, and falls . Referrals and appointments  In addition, I have reviewed and discussed  with patient certain preventive protocols, quality metrics, and best practice recommendations. A written personalized care plan for preventive services as well as general preventive health recommendations were provided to patient.     Clemetine Marker, LPN   [id number]    Nurse Notes: pt states he has had ongoing issues with vertigo and had MRI on 10/26/20. Pt states he is drinking approx 15 beers per day because that's what helps him deal with things. He also requested refill of valium; advised patient to contact Dr. Pryor Ochoa who he says prescribed medication. Pt encouraged to lessen alcohol consumption in conjunction with medications.

## 2020-11-02 DIAGNOSIS — R42 Dizziness and giddiness: Secondary | ICD-10-CM | POA: Diagnosis not present

## 2020-11-03 ENCOUNTER — Other Ambulatory Visit: Payer: Self-pay | Admitting: Family Medicine

## 2020-11-03 ENCOUNTER — Other Ambulatory Visit: Payer: Self-pay

## 2020-11-03 DIAGNOSIS — R42 Dizziness and giddiness: Secondary | ICD-10-CM

## 2020-11-03 DIAGNOSIS — F419 Anxiety disorder, unspecified: Secondary | ICD-10-CM

## 2020-11-03 NOTE — Progress Notes (Unsigned)
Recommended by [facility]NT

## 2020-11-11 DIAGNOSIS — E1159 Type 2 diabetes mellitus with other circulatory complications: Secondary | ICD-10-CM | POA: Diagnosis not present

## 2020-11-11 DIAGNOSIS — H3552 Pigmentary retinal dystrophy: Secondary | ICD-10-CM | POA: Diagnosis not present

## 2020-11-11 DIAGNOSIS — R2689 Other abnormalities of gait and mobility: Secondary | ICD-10-CM | POA: Diagnosis not present

## 2020-11-11 DIAGNOSIS — I152 Hypertension secondary to endocrine disorders: Secondary | ICD-10-CM | POA: Diagnosis not present

## 2020-11-11 DIAGNOSIS — R42 Dizziness and giddiness: Secondary | ICD-10-CM | POA: Diagnosis not present

## 2020-11-11 DIAGNOSIS — H9193 Unspecified hearing loss, bilateral: Secondary | ICD-10-CM | POA: Diagnosis not present

## 2020-11-11 DIAGNOSIS — H548 Legal blindness, as defined in USA: Secondary | ICD-10-CM | POA: Diagnosis not present

## 2020-11-20 DIAGNOSIS — F411 Generalized anxiety disorder: Secondary | ICD-10-CM | POA: Insufficient documentation

## 2020-11-25 ENCOUNTER — Other Ambulatory Visit: Payer: Self-pay | Admitting: Family Medicine

## 2020-11-25 DIAGNOSIS — R600 Localized edema: Secondary | ICD-10-CM

## 2020-12-10 ENCOUNTER — Other Ambulatory Visit: Payer: Self-pay | Admitting: Family Medicine

## 2020-12-10 ENCOUNTER — Telehealth: Payer: Self-pay

## 2020-12-10 MED ORDER — COLCHICINE 0.6 MG PO TABS: 0.6000 mg | ORAL_TABLET | Freq: Two times a day (BID) | ORAL | 0 refills | Status: DC | PRN

## 2020-12-10 NOTE — Telephone Encounter (Signed)
Copied from [place] [phone]. Topic: General - Other >> Dec 10, 2020  9:16 AM Yvette Rack wrote: Reason for CRM: Pt stated he can hardly put pressure on his foot due to gout. Pt requests that a Rx for gout medication be sent to [place], [place].

## 2020-12-10 NOTE — Telephone Encounter (Signed)
Copied from [place] [phone]. Topic: General - Other >> Dec 10, 2020  9:16 AM Yvette Rack wrote: Reason for CRM: Pt stated he can hardly put pressure on his foot due to gout. Pt requests that a Rx for gout medication be sent to [place], [place].

## 2020-12-16 ENCOUNTER — Other Ambulatory Visit: Payer: Self-pay | Admitting: Family Medicine

## 2020-12-16 DIAGNOSIS — R600 Localized edema: Secondary | ICD-10-CM

## 2020-12-22 ENCOUNTER — Telehealth: Payer: Self-pay

## 2020-12-22 ENCOUNTER — Other Ambulatory Visit: Payer: Self-pay

## 2020-12-22 NOTE — Telephone Encounter (Signed)
Copied from [place] [phone]. Topic: General - Other >> Dec 22, 2020  1:11 PM Keene Breath wrote: Reason for CRM: Patient called to let the nurse or doctor know that his gout has returned today and he would like some medication for it.  Please advise and call patient to let him know if he could get some medication.  CB# [phone]

## 2020-12-23 NOTE — Telephone Encounter (Signed)
Pt states his gout is better today and he has an appt already scheduled for 01/03/21 and he will wait for that appt

## 2020-12-30 NOTE — Progress Notes (Signed)
Name: Joel Bryant   MRN: [id number]    DOB: 08-Dec-1960   Date:01/03/2021       Progress Note  Subjective  Chief Complaint  Follow Up  HPI  DM II with complications. diagnosed years ago, he states used to take Januvia and also Metformin, however on his last visit we decreased Metformin to 500 mg daily and last visit we stopped all medications, today his A1C is down to 4.4 % He still has some neuropathy/paresthesia on toes but stable without medications, he still has microalbuminuria. Denies polyphagia, polyuria or polydipsia . He had lost another 11 lbs previous to his last visit, but today he is up 9 lbs.   Weight loss: seen by Dr. Pryor Ochoa because he was having difficulty swallowing, he had CT neck and showed some thickness on his esophagus, he states he was advised to resume Nexium and added Allegra, was given antibiotics and is feeling better, Dr. Pryor Ochoa recommended follow up with GI but he states he does not want to go, explained it is important for him to go , especially because of the significant weight loss.   Aorta Atherosclerosis:found  on CT abdomen done 05/22/2018 , he is taking statin and aspirin, no side effects  . Last LDL was not at goal, 131 subtracting total cholesterol from HDL, but previoulsy it was 68 . Discussed going up on dose of Atorvastatin but he would like to hold off for now  Gout: he states last episodes was years ago but had a flare a few months ago, he took colchicine and would like to have a refill   Vertigo: symptoms were severe last Fall, he went to ENT and neurologist, took some meclizine and states not having symptoms at this time.    History of drug abuse: he states he has used all kinds of drugs, including cocaine, heroine, but has been clean for over 2 years. He states cannot go to the pain clinic because of his history. He is still in remission   Chronic back pain, failed back surgery, also has Osteonecrosis of hips and chronic hip and knee pain: he has  difficulty sleeping well, has to shift positions. Pain is daily but not severe , currently is 2/10  He states back spasms are bothersome. No bowel or bladder incontinence, he states baclofen made him feel loopy but would like to try another muscle relaxer, he stopped gabapentin by himself and not interested on going on it.   HTN: taking bp medication as prescribed, lisinopril and Atenolol, bp is okay today, we will continue current dose for now, goal uis to keep it below 135/85   Patient Active Problem List   Diagnosis Date Noted  . GAD (generalized anxiety disorder) 11/20/2020  . History of drug abuse in remission ([place]) 04/30/2020  . Closed compression fracture of thoracic vertebra ([place]) 06/13/2018  . Fatty liver 06/13/2018  . Aortic atherosclerosis ([place]) 06/13/2018  . Chronic pain syndrome 09/06/2016  . Dyslipidemia associated with type 2 diabetes mellitus ([place]) 09/27/2015  . H/O adenomatous polyp of colon 09/27/2015  . Dysmetabolic syndrome 24/23/5361  . Post Laminectomy Syndrome (L4-5 Hemilaminectomy and partial Facetectomy) 09/27/2015  . Lumbar spondylosis 09/27/2015  . 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 Epidural fibrosis 09/27/2015  . Chronic low back pain (Location of Primary Source of Pain) (Bilateral) (L>R) 09/27/2015  . Chronic left knee pain 09/27/2015  . Lumbar spinal stenosis (L4-5) 09/27/2015  . Lumbar foraminal stenosis (Bilateral L3-4 and L4-5) 09/27/2015  . Lumbar  facet hypertrophy (Bilateral, Multilevel) 09/27/2015  . Lumbar facet syndrome (Bilateral) 09/27/2015  . Bilateral hip osteonecrosis ([place]) (Bilateral Femoral Head) 09/27/2015  . Chronic hip pain (Location of Secondary source of pain) (Bilateral) (L>R) (secondary to osteonecrosis) 09/27/2015  . Edema leg 09/13/2015  . Gout 09/13/2015  . Obstructive apnea 09/13/2015  . Anxiety disorder 06/11/2015  . Hypertriglyceridemia 06/11/2015  . Dyslipidemia 06/11/2015  . Controlled type 2 diabetes mellitus with  diabetic neuropathy, without long-term current use of insulin ([place]) 06/11/2015  . 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 Retinitis pigmentosa 06/11/2015    Past Surgical History:  Procedure Laterality Date  . BACK SURGERY  2001   laminectomy lumbar spine  . CATARACT EXTRACTION  1992   Insert Prosthetic Lens  . COLONOSCOPY WITH PROPOFOL N/A 02/28/2017   Procedure: COLONOSCOPY WITH PROPOFOL;  Surgeon: Manya Silvas, MD;  Location: [facility] ENDOSCOPY;  Service: Endoscopy;  Laterality: N/A;  . COLONOSCOPY WITH PROPOFOL N/A 06/03/2020   Procedure: COLONOSCOPY WITH PROPOFOL;  Surgeon: [name], MD;  Location: [facility] ENDOSCOPY;  Service: Endoscopy;  Laterality: N/A;  . EYE SURGERY    . KYPHOPLASTY N/A 08/08/2018   Procedure: [id number];  Surgeon: Hessie Knows, MD;  Location: [facility] ORS;  Service: Orthopedics;  Laterality: N/A;  . LAMINECTOMY  2001   lumbar  . MULTIPLE TOOTH EXTRACTIONS     2018  . TIBIA FRACTURE SURGERY    . TONSILLECTOMY    . WRIST SURGERY Right    x2    Family History  Problem Relation Age of Onset  . Colon polyps Mother   . Retinitis pigmentosa Mother   . Retinitis pigmentosa Maternal Grandmother   . Cancer Maternal Grandfather   . Diabetes Paternal Grandmother   . Alcohol abuse Father     Social History   Tobacco Use  . Smoking status: Current Some Day Smoker    Types: Cigarettes  . Smokeless tobacco: Never Used  . Tobacco comment: Quit smoking 4 years now; smokes cigarettes every now and then  Substance Use Topics  . Alcohol use: Yes    Alcohol/week: 0.0 standard drinks    Comment: 10 beer today     Current Outpatient Medications:  .  atorvastatin (LIPITOR) 20 MG tablet, Take 1 tablet (20 mg total) by mouth daily at 6 PM., Disp: 90 tablet, Rfl: 1 .  Calcium Carb-Cholecalciferol (CALCIUM 500 + D) 500-200 MG-UNIT TABS, Take by mouth., Disp: , Rfl:  .  colchicine 0.6 MG tablet, Take 1 tablet (0.6 mg total) by mouth 2 (two) times daily as  needed. Take one and may repeat a second dose in 2 hours, may stop when pain resolves, Disp: 30 tablet, Rfl: 0 .  meclizine (ANTIVERT) 25 MG tablet, Take 1 tablet (25 mg total) by mouth 3 (three) times daily as needed for dizziness. May make drowsy, Disp: 30 tablet, Rfl: 1 .  atenolol (TENORMIN) 50 MG tablet, Take 1 tablet (50 mg total) by mouth daily., Disp: 90 tablet, Rfl: 1 .  esomeprazole (NEXIUM) 40 MG capsule, Take 1 capsule (40 mg total) by mouth daily., Disp: 90 capsule, Rfl: 1 .  furosemide (LASIX) 40 MG tablet, Take 1 tablet (40 mg total) by mouth daily as needed., Disp: 90 tablet, Rfl: 0 .  lisinopril (ZESTRIL) 2.5 MG tablet, Take 1 tablet (2.5 mg total) by mouth daily., Disp: 90 tablet, Rfl: 1 .  omega-3 acid ethyl esters (LOVAZA) 1 g capsule, Take 2 capsules (2 g total) by mouth 2 (two) times  daily., Disp: 360 capsule, Rfl: 1  No Known Allergies  I personally reviewed active problem list, medication list, allergies, family history, social history, health maintenance with the patient/caregiver today.   ROS  Constitutional: Negative for fever , positive for  weight change.  Respiratory: Negative for cough and shortness of breath.   Cardiovascular: Negative for chest pain or palpitations.  Gastrointestinal: Negative for abdominal pain, no bowel changes.  Musculoskeletal: Negative for gait problem or joint swelling.  Skin: Negative for rash.  Neurological: Negative for dizziness or headache.  No other specific complaints in a complete review of systems (except as listed in HPI above).  Objective  Vitals:   01/03/21 1039  BP: 132/86  Pulse: 100  Resp: 17  Temp: 98.1 F (36.7 C)  TempSrc: Oral  SpO2: 99%  Weight: 201 lb (91.2 kg)  Height: 5\' 9"  (1.753 m)    Body mass index is 29.68 kg/m.  Physical Exam  Constitutional: Patient appears well-developed and well-nourished. Overweight.  No distress.  HEENT: head atraumatic, normocephalic, pupils equal and reactive to  light,neck supple Cardiovascular: Normal rate, regular rhythm and normal heart sounds.  No murmur heard. Trace  BLE edema. Pulmonary/Chest: Effort normal and breath sounds normal. No respiratory distress. Abdominal: Soft.  There is no tenderness. Psychiatric: Patient has a normal mood and affect. behavior is normal. Judgment and thought content normal.  Recent Results (from the past 2160 hour(s))  POCT HgB A1C     Status: Normal   Collection Time: 01/03/21 10:43 AM  Result Value Ref Range   Hemoglobin A1C 4.4 4.0 - 5.6 %   HbA1c POC (<> result, manual entry)     HbA1c, POC (prediabetic range)     HbA1c, POC (controlled diabetic range)        PHQ2/9: Depression screen [facility] 2/9 01/03/2021 10/28/2020 10/14/2020 09/03/2020 09/16/2018  Decreased Interest 0 0 0 0 0  Down, Depressed, Hopeless 0 0 0 0 0  PHQ - 2 Score 0 0 0 0 0  Altered sleeping - - - - 0  Tired, decreased energy - - - - 0  Change in appetite - - - - 0  Feeling bad or failure about yourself  - - - - 0  Trouble concentrating - - - - 0  Moving slowly or fidgety/restless - - - - 0  Suicidal thoughts - - - - 0  PHQ-9 Score - - - - 0  Difficult doing work/chores - - - - Not difficult at all    phq 9 is negative   Fall Risk: Fall Risk  01/03/2021 10/28/2020 10/14/2020 09/03/2020 09/16/2018  Falls in the past year? 1 1 1  0 1  Number falls in past yr: 1 0 0 0 0  Injury with Fall? 0 0 0 0 1  Risk for fall due to : - History of fall(s);Impaired vision - - -  Follow up - Falls prevention discussed Falls evaluation completed - -     Functional Status Survey: Is the patient deaf or have difficulty hearing?: Yes Does the patient have difficulty seeing, even when wearing glasses/contacts?: Yes Does the patient have difficulty concentrating, remembering, or making decisions?: No Does the patient have difficulty walking or climbing stairs?: No Does the patient have difficulty dressing or bathing?: No Does the patient have  difficulty doing errands alone such as visiting a doctor's office or shopping?: No    Assessment & Plan  1. Type 2 diabetes mellitus with microalbuminuria, without long-term current use of  insulin (HCC)  - POCT HgB A1C  2. Aortic atherosclerosis (HCC)  - omega-3 acid ethyl esters (LOVAZA) 1 g capsule; Take 2 capsules (2 g total) by mouth 2 (two) times daily.  Dispense: 360 capsule; Refill: 1  3. Chronic pain syndrome   4. Hypertriglyceridemia  - omega-3 acid ethyl esters (LOVAZA) 1 g capsule; Take 2 capsules (2 g total) by mouth 2 (two) times daily.  Dispense: 360 capsule; Refill: 1  5. Legally blind   6. History of drug abuse in remission ([place])   7. Gastroesophageal reflux disease without esophagitis  - esomeprazole (NEXIUM) 40 MG capsule; Take 1 capsule (40 mg total) by mouth daily.  Dispense: 90 capsule; Refill: 1  8. Dyslipidemia   9. Essential hypertension  - atenolol (TENORMIN) 50 MG tablet; Take 1 tablet (50 mg total) by mouth daily.  Dispense: 90 tablet; Refill: 1 - lisinopril (ZESTRIL) 2.5 MG tablet; Take 1 tablet (2.5 mg total) by mouth daily.  Dispense: 90 tablet; Refill: 1   10. 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 Bilateral edema of lower extremity  - furosemide (LASIX) 40 MG tablet; Take 1 tablet (40 mg total) by mouth daily as needed.  Dispense: 90 tablet; Refill: 0  12. Hypertension associated with diabetes ([place])   13. Bilateral hip osteonecrosis ([place])   15. Controlled gout  - colchicine 0.6 MG tablet; Take 1 tablet (0.6 mg total) by mouth 2 (two) times daily as needed. Take one and may repeat a second dose in 2 hours, may stop when pain resolves  Dispense: 30 tablet; Refill: 0

## 2021-01-03 ENCOUNTER — Encounter: Payer: Self-pay | Admitting: Family Medicine

## 2021-01-03 ENCOUNTER — Ambulatory Visit (INDEPENDENT_AMBULATORY_CARE_PROVIDER_SITE_OTHER): Payer: Medicare Other | Admitting: Family Medicine

## 2021-01-03 ENCOUNTER — Other Ambulatory Visit: Payer: Self-pay

## 2021-01-03 VITALS — BP 132/86 | HR 100 | Temp 98.1°F | Resp 17 | Ht 69.0 in | Wt 201.0 lb

## 2021-01-03 DIAGNOSIS — E785 Hyperlipidemia, unspecified: Secondary | ICD-10-CM | POA: Diagnosis not present

## 2021-01-03 DIAGNOSIS — K219 Gastro-esophageal reflux disease without esophagitis: Secondary | ICD-10-CM | POA: Diagnosis not present

## 2021-01-03 DIAGNOSIS — E1122 Type 2 diabetes mellitus with diabetic chronic kidney disease: Secondary | ICD-10-CM

## 2021-01-03 DIAGNOSIS — I10 Essential (primary) hypertension: Secondary | ICD-10-CM | POA: Diagnosis not present

## 2021-01-03 DIAGNOSIS — E1159 Type 2 diabetes mellitus with other circulatory complications: Secondary | ICD-10-CM | POA: Diagnosis not present

## 2021-01-03 DIAGNOSIS — I700 Atherosclerosis of aorta: Secondary | ICD-10-CM | POA: Diagnosis not present

## 2021-01-03 DIAGNOSIS — G894 Chronic pain syndrome: Secondary | ICD-10-CM | POA: Diagnosis not present

## 2021-01-03 DIAGNOSIS — R809 Proteinuria, unspecified: Secondary | ICD-10-CM

## 2021-01-03 DIAGNOSIS — E1129 Type 2 diabetes mellitus with other diabetic kidney complication: Secondary | ICD-10-CM | POA: Diagnosis not present

## 2021-01-03 DIAGNOSIS — I129 Hypertensive chronic kidney disease with stage 1 through stage 4 chronic kidney disease, or unspecified chronic kidney disease: Secondary | ICD-10-CM

## 2021-01-03 DIAGNOSIS — R600 Localized edema: Secondary | ICD-10-CM | POA: Diagnosis not present

## 2021-01-03 DIAGNOSIS — H548 Legal blindness, as defined in USA: Secondary | ICD-10-CM | POA: Diagnosis not present

## 2021-01-03 DIAGNOSIS — F1911 Other psychoactive substance abuse, in remission: Secondary | ICD-10-CM

## 2021-01-03 DIAGNOSIS — E781 Pure hyperglyceridemia: Secondary | ICD-10-CM | POA: Diagnosis not present

## 2021-01-03 DIAGNOSIS — M109 Gout, unspecified: Secondary | ICD-10-CM

## 2021-01-03 DIAGNOSIS — E1140 Type 2 diabetes mellitus with diabetic neuropathy, unspecified: Secondary | ICD-10-CM

## 2021-01-03 DIAGNOSIS — I152 Hypertension secondary to endocrine disorders: Secondary | ICD-10-CM

## 2021-01-03 DIAGNOSIS — M879 Osteonecrosis, unspecified: Secondary | ICD-10-CM

## 2021-01-03 LAB — POCT GLYCOSYLATED HEMOGLOBIN (HGB A1C): Hemoglobin A1C: 4.4 % (ref 4.0–5.6)

## 2021-01-03 MED ORDER — ESOMEPRAZOLE MAGNESIUM 40 MG PO CPDR: 40.0000 mg | DELAYED_RELEASE_CAPSULE | Freq: Every day | ORAL | 1 refills | Status: DC

## 2021-01-03 MED ORDER — LISINOPRIL 2.5 MG PO TABS: 2.5000 mg | ORAL_TABLET | Freq: Every day | ORAL | 1 refills | Status: DC

## 2021-01-03 MED ORDER — ATENOLOL 50 MG PO TABS: 50.0000 mg | ORAL_TABLET | Freq: Every day | ORAL | 1 refills | Status: DC

## 2021-01-03 MED ORDER — OMEGA-3-ACID ETHYL ESTERS 1 G PO CAPS: 2.0000 g | ORAL_CAPSULE | Freq: Two times a day (BID) | ORAL | 1 refills | Status: DC

## 2021-01-03 MED ORDER — COLCHICINE 0.6 MG PO TABS: 0.6000 mg | ORAL_TABLET | Freq: Two times a day (BID) | ORAL | 0 refills | Status: DC | PRN

## 2021-01-03 MED ORDER — FUROSEMIDE 40 MG PO TABS: 40.0000 mg | ORAL_TABLET | Freq: Every day | ORAL | 0 refills | Status: DC | PRN

## 2021-02-22 ENCOUNTER — Other Ambulatory Visit: Payer: Self-pay | Admitting: Family Medicine

## 2021-02-22 DIAGNOSIS — E785 Hyperlipidemia, unspecified: Secondary | ICD-10-CM

## 2021-02-22 DIAGNOSIS — M109 Gout, unspecified: Secondary | ICD-10-CM

## 2021-05-19 ENCOUNTER — Other Ambulatory Visit: Payer: Self-pay | Admitting: Family Medicine

## 2021-05-19 DIAGNOSIS — R600 Localized edema: Secondary | ICD-10-CM

## 2021-07-08 ENCOUNTER — Ambulatory Visit: Payer: Medicare Other | Admitting: Family Medicine

## 2021-08-01 ENCOUNTER — Other Ambulatory Visit: Payer: Self-pay

## 2021-08-01 ENCOUNTER — Ambulatory Visit (INDEPENDENT_AMBULATORY_CARE_PROVIDER_SITE_OTHER): Payer: Medicare Other | Admitting: Family Medicine

## 2021-08-01 ENCOUNTER — Encounter: Payer: Self-pay | Admitting: Family Medicine

## 2021-08-01 VITALS — BP 134/76 | HR 64 | Temp 98.0°F | Resp 16 | Ht 69.0 in | Wt 224.2 lb

## 2021-08-01 DIAGNOSIS — R600 Localized edema: Secondary | ICD-10-CM | POA: Diagnosis not present

## 2021-08-01 DIAGNOSIS — I129 Hypertensive chronic kidney disease with stage 1 through stage 4 chronic kidney disease, or unspecified chronic kidney disease: Secondary | ICD-10-CM

## 2021-08-01 DIAGNOSIS — R809 Proteinuria, unspecified: Secondary | ICD-10-CM

## 2021-08-01 DIAGNOSIS — I700 Atherosclerosis of aorta: Secondary | ICD-10-CM | POA: Diagnosis not present

## 2021-08-01 DIAGNOSIS — E785 Hyperlipidemia, unspecified: Secondary | ICD-10-CM

## 2021-08-01 DIAGNOSIS — E1122 Type 2 diabetes mellitus with diabetic chronic kidney disease: Secondary | ICD-10-CM | POA: Diagnosis not present

## 2021-08-01 DIAGNOSIS — E1129 Type 2 diabetes mellitus with other diabetic kidney complication: Secondary | ICD-10-CM | POA: Diagnosis not present

## 2021-08-01 DIAGNOSIS — G4733 Obstructive sleep apnea (adult) (pediatric): Secondary | ICD-10-CM

## 2021-08-01 DIAGNOSIS — I10 Essential (primary) hypertension: Secondary | ICD-10-CM

## 2021-08-01 DIAGNOSIS — Z23 Encounter for immunization: Secondary | ICD-10-CM | POA: Diagnosis not present

## 2021-08-01 DIAGNOSIS — K219 Gastro-esophageal reflux disease without esophagitis: Secondary | ICD-10-CM

## 2021-08-01 DIAGNOSIS — E781 Pure hyperglyceridemia: Secondary | ICD-10-CM | POA: Diagnosis not present

## 2021-08-01 DIAGNOSIS — F1911 Other psychoactive substance abuse, in remission: Secondary | ICD-10-CM

## 2021-08-01 LAB — POCT GLYCOSYLATED HEMOGLOBIN (HGB A1C): Hemoglobin A1C: 5.7 % — AB (ref 4.0–5.6)

## 2021-08-01 MED ORDER — OMEGA-3-ACID ETHYL ESTERS 1 G PO CAPS: 2.0000 g | ORAL_CAPSULE | Freq: Two times a day (BID) | ORAL | 1 refills | Status: DC

## 2021-08-01 MED ORDER — ESOMEPRAZOLE MAGNESIUM 40 MG PO CPDR
40.0000 mg | DELAYED_RELEASE_CAPSULE | Freq: Every day | ORAL | 1 refills | Status: DC
Start: 2021-08-01 — End: 2022-01-30

## 2021-08-01 MED ORDER — ATENOLOL 50 MG PO TABS: 50.0000 mg | ORAL_TABLET | Freq: Every day | ORAL | 1 refills | Status: DC

## 2021-08-01 MED ORDER — LISINOPRIL 5 MG PO TABS: 5.0000 mg | ORAL_TABLET | Freq: Every day | ORAL | 1 refills | Status: DC

## 2021-08-01 NOTE — Progress Notes (Signed)
Name: Joel Bryant   MRN: [id number]    DOB: 04/30/1961   Date:08/01/2021       Progress Note  Subjective  Chief Complaint  Follow Up  HPI  DM II with complications. diagnosed years ago, he was on Januvia and Metformin but off medications for over 6 months and is doing well. A1C is 5.7 % He still has some neuropathy/paresthesia on toes but stable without medications, he still has microalbuminuria and we will recheck labs today  Denies polyphagia, polyuria or polydipsia . Weight is stable now He has changed his diet   Weight loss: seen by Dr. Pryor Ochoa because he was having difficulty swallowing, he had CT neck and showed some thickness on his esophagus, he states he was advised to resume Nexium and Fexofenadine  ( but he stopped taking it) , Dr. Pryor Ochoa. He was advised to see GI, but when he went he only had a colonoscopy   Aorta Atherosclerosis:found  on CT abdomen done 05/22/2018 , he is taking statin and aspirin, no side effects  . Last LDL was not at goal, 131 subtracting total cholesterol from HDL, but previoulsy it was 68 . We will recheck level today   Gout: he states gout attacks are seldom, and has prn medications at home   Vertigo: symptoms were severe last Fall, he went to ENT and neurologist, took some meclizine and states not having symptoms at this time. He states he takes Meclizine prn for sleep now, explained not indicated for that    History of drug abuse: he states he has used all kinds of drugs, including cocaine, heroine, but has been clean for over 3 years. He states cannot go to the pain clinic because of his history.  Unchanged   Chronic back pain, failed back surgery, also has Osteonecrosis of hips and chronic hip and knee pain: he has difficulty sleeping well, has to shift positions. Pain is daily but not severe , currently is 2/-310  He states no recent muscle spasms.  No bowel or bladder incontinence, he states baclofen made him feel loopy but would like to try another  muscle relaxer, he stopped gabapentin by himself and not interested on going on it.   HTN: taking bp medication as prescribed, lisinopril and Atenolol, bp is okay today, we will increase dose of Lisinopril and monitor   Patient Active Problem List   Diagnosis Date Noted   GAD (generalized anxiety disorder) 11/20/2020   History of drug abuse in remission ([place]) 04/30/2020   Closed compression fracture of thoracic vertebra ([place]) 06/13/2018   Fatty liver 06/13/2018   Aortic atherosclerosis ([place]) 06/13/2018   Chronic pain syndrome 09/06/2016   Dyslipidemia associated with type 2 diabetes mellitus ([place]) 09/27/2015   H/O adenomatous polyp of colon 37/16/9678   Dysmetabolic syndrome 93/81/0175   Post Laminectomy Syndrome (L4-5 Hemilaminectomy and partial Facetectomy) 09/27/2015   Lumbar spondylosis 09/27/2015   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   Epidural fibrosis 09/27/2015   Chronic low back pain (Location of Primary Source of Pain) (Bilateral) (L>R) 09/27/2015   Chronic left knee pain 09/27/2015   Lumbar spinal stenosis (L4-5) 09/27/2015   Lumbar foraminal stenosis (Bilateral L3-4 and L4-5) 09/27/2015   Lumbar facet hypertrophy (Bilateral, Multilevel) 09/27/2015   Lumbar facet syndrome (Bilateral) 09/27/2015   Bilateral hip osteonecrosis ([place]) (Bilateral Femoral Head) 09/27/2015   Chronic hip pain (Location of Secondary source of pain) (Bilateral) (L>R) (secondary to osteonecrosis) 09/27/2015   Edema leg 09/13/2015   Gout 09/13/2015  Obstructive apnea 09/13/2015   Anxiety disorder 06/11/2015   Hypertriglyceridemia 06/11/2015   Dyslipidemia 06/11/2015   Controlled type 2 diabetes mellitus with diabetic neuropathy, without long-term current use of insulin ([place]) 06/11/2015   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   Retinitis pigmentosa 06/11/2015    Past Surgical History:  Procedure Laterality Date   BACK SURGERY  2001   laminectomy lumbar spine   CATARACT EXTRACTION  1992    Insert Prosthetic Lens   COLONOSCOPY WITH PROPOFOL N/A 02/28/2017   Procedure: COLONOSCOPY WITH PROPOFOL;  Surgeon: Manya Silvas, MD;  Location: [place];  Service: Endoscopy;  Laterality: N/A;   COLONOSCOPY WITH PROPOFOL N/A 06/03/2020   Procedure: COLONOSCOPY WITH PROPOFOL;  Surgeon: [name], MD;  Location: [facility] ENDOSCOPY;  Service: Endoscopy;  Laterality: N/A;   EYE SURGERY     KYPHOPLASTY N/A 08/08/2018   Procedure: [id number];  Surgeon: Hessie Knows, MD;  Location: [facility] ORS;  Service: Orthopedics;  Laterality: N/A;   LAMINECTOMY  2001   lumbar   MULTIPLE TOOTH EXTRACTIONS     2018   TIBIA FRACTURE SURGERY     TONSILLECTOMY     WRIST SURGERY Right    x2    Family History  Problem Relation Age of Onset   Colon polyps Mother    Retinitis pigmentosa Mother    Retinitis pigmentosa Maternal Grandmother    Cancer Maternal Grandfather    Diabetes Paternal Grandmother    Alcohol abuse Father     Social History   Tobacco Use   Smoking status: Some Days    Types: Cigarettes   Smokeless tobacco: Never   Tobacco comments:    Quit smoking 4 years now; smokes cigarettes every now and then  Substance Use Topics   Alcohol use: Yes    Alcohol/week: 0.0 standard drinks    Comment: 10 beer today     Current Outpatient Medications:    atorvastatin (LIPITOR) 20 MG tablet, TAKE 1 TABLET BY MOUTH ONCE A DAY AT [facility] THE EVENING, Disp: 90 tablet, Rfl: 1   furosemide (LASIX) 40 MG tablet, TAKE 1 TABLET BY MOUTH ONCE A DAY AS NEEDED, Disp: 90 tablet, Rfl: 0   meclizine (ANTIVERT) 25 MG tablet, Take 1 tablet (25 mg total) by mouth 3 (three) times daily as needed for dizziness. May make drowsy, Disp: 30 tablet, Rfl: 1   atenolol (TENORMIN) 50 MG tablet, Take 1 tablet (50 mg total) by mouth daily., Disp: 90 tablet, Rfl: 1   colchicine 0.6 MG tablet, TAKE 1 TABLET BY MOUTH 2 TIMES DAILY AS NEEDED. TAKE ONE AND MAY REPEAT A SECONDDOSE IN 2 HOURS, MAY STOP WHEN PAIN  RESOLVES (Patient not taking: Reported on 08/01/2021), Disp: 10 tablet, Rfl: 0   esomeprazole (NEXIUM) 40 MG capsule, Take 1 capsule (40 mg total) by mouth daily., Disp: 90 capsule, Rfl: 1   lisinopril (ZESTRIL) 5 MG tablet, Take 1 tablet (5 mg total) by mouth daily., Disp: 90 tablet, Rfl: 1   omega-3 acid ethyl esters (LOVAZA) 1 g capsule, Take 2 capsules (2 g total) by mouth 2 (two) times daily., Disp: 360 capsule, Rfl: 1  No Known Allergies  I personally reviewed active problem list, medication list, allergies, family history, social history, health maintenance with the patient/caregiver today.   ROS  Constitutional: Negative for fever or weight change.  Respiratory: Negative for cough and shortness of breath.   Cardiovascular: Negative for chest pain or palpitations.  Gastrointestinal: Negative for abdominal pain, no bowel changes.  Musculoskeletal: Negative for gait problem or joint swelling.  Skin: Negative for rash.  Neurological: Negative for dizziness or headache.  No other specific complaints in a complete review of systems (except as listed in HPI above).   Objective  Vitals:   08/01/21 0959  Pulse: 64  Resp: 16  Temp: 98 F (36.7 C)  TempSrc: Oral  SpO2: 97%  Weight: 224 lb 3.2 oz (101.7 kg)  Height: 5\' 9"  (1.753 m)    Body mass index is 33.11 kg/m.  Physical Exam  Constitutional: Patient appears well-developed and well-nourished. Obese  No distress.  HEENT: head atraumatic, normocephalic, pupils equal and reactive to light, neck supple Cardiovascular: Normal rate, regular rhythm and normal heart sounds.  No murmur heard. No BLE edema. Pulmonary/Chest: Effort normal and breath sounds normal. No respiratory distress. Abdominal: Soft.  There is no tenderness. Psychiatric: Patient has a normal mood and affect. behavior is normal. Judgment and thought content normal.   Recent Results (from the past 2160 hour(s))  POCT HgB A1C     Status: Abnormal   Collection  Time: 08/01/21 10:01 AM  Result Value Ref Range   Hemoglobin A1C 5.7 (A) 4.0 - 5.6 %   HbA1c POC (<> result, manual entry)     HbA1c, POC (prediabetic range)     HbA1c, POC (controlled diabetic range)      Diabetic Foot Exam: Diabetic Foot Exam - Simple   Simple Foot Form Diabetic Foot exam was performed with the following findings: Yes 08/01/2021 10:25 AM  Visual Inspection No deformities, no ulcerations, no other skin breakdown bilaterally: Yes Sensation Testing Intact to touch and monofilament testing bilaterally: Yes Pulse Check Posterior Tibialis and Dorsalis pulse intact bilaterally: Yes Comments      PHQ2/9: Depression screen [facility] 2/9 08/01/2021 01/03/2021 10/28/2020 10/14/2020 09/03/2020  Decreased Interest 0 0 0 0 0  Down, Depressed, Hopeless 0 0 0 0 0  PHQ - 2 Score 0 0 0 0 0  Altered sleeping 0 - - - -  Tired, decreased energy 0 - - - -  Change in appetite 0 - - - -  Feeling bad or failure about yourself  0 - - - -  Trouble concentrating 0 - - - -  Moving slowly or fidgety/restless 0 - - - -  Suicidal thoughts 0 - - - -  PHQ-9 Score 0 - - - -  Difficult doing work/chores Not difficult at all - - - -    phq 9 is negative   Fall Risk: Fall Risk  08/01/2021 01/03/2021 10/28/2020 10/14/2020 09/03/2020  Falls in the past year? 1 1 1 1  0  Number falls in past yr: 0 1 0 0 0  Injury with Fall? 0 0 0 0 0  Risk for fall due to : - - History of fall(s);Impaired vision - -  Follow up - - Falls prevention discussed Falls evaluation completed -     Functional Status Survey: Is the patient deaf or have difficulty hearing?: No Does the patient have difficulty seeing, even when wearing glasses/contacts?: Yes Does the patient have difficulty concentrating, remembering, or making decisions?: No Does the patient have difficulty walking or climbing stairs?: No Does the patient have difficulty dressing or bathing?: No Does the patient have difficulty doing errands alone such as  visiting a doctor's office or shopping?: No    Assessment & Plan  1. Type 2 diabetes mellitus with microalbuminuria, without long-term current use of insulin (HCC)  - POCT HgB A1C -  Lipid panel - Microalbumin / creatinine urine ratio  2. Aortic atherosclerosis (HCC)  - omega-3 acid ethyl esters (LOVAZA) 1 g capsule; Take 2 capsules (2 g total) by mouth 2 (two) times daily.  Dispense: 360 capsule; Refill: 1  3. Essential hypertension  - CBC with Differential/Platelet - atenolol (TENORMIN) 50 MG tablet; Take 1 tablet (50 mg total) by mouth daily.  Dispense: 90 tablet; Refill: 1 - lisinopril (ZESTRIL) 5 MG tablet; Take 1 tablet (5 mg total) by mouth daily.  Dispense: 90 tablet; Refill: 1  4. Dyslipidemia   5. History of drug abuse in remission ([place])   6. 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 Obstructive apnea  - CBC with Differential/Platelet  8. Needs flu shot  Today   9. Gastroesophageal reflux disease without esophagitis  - esomeprazole (NEXIUM) 40 MG capsule; Take 1 capsule (40 mg total) by mouth daily.  Dispense: 90 capsule; Refill: 1  10. Bilateral edema of lower extremity  Taking furosemide prn   11. Hypertriglyceridemia  - omega-3 acid ethyl esters (LOVAZA) 1 g capsule; Take 2 capsules (2 g total) by mouth 2 (two) times daily.  Dispense: 360 capsule; Refill: 1

## 2021-08-02 LAB — COMPLETE METABOLIC PANEL WITH GFR
AG Ratio: 1.5 (calc) (ref 1.0–2.5)
ALT: 33 U/L (ref 9–46)
AST: 96 U/L — ABNORMAL HIGH (ref 10–35)
Albumin: 4.4 g/dL (ref 3.6–5.1)
Alkaline phosphatase (APISO): 142 U/L (ref 35–144)
BUN/Creatinine Ratio: 17 (calc) (ref 6–22)
BUN: 11 mg/dL (ref 7–25)
CO2: 26 mmol/L (ref 20–32)
Calcium: 9.2 mg/dL (ref 8.6–10.3)
Chloride: 97 mmol/L — ABNORMAL LOW (ref 98–110)
Creat: 0.66 mg/dL — ABNORMAL LOW (ref 0.70–1.35)
Globulin: 3 g/dL (calc) (ref 1.9–3.7)
Glucose, Bld: 119 mg/dL (ref 65–139)
Potassium: 4.1 mmol/L (ref 3.5–5.3)
Sodium: 134 mmol/L — ABNORMAL LOW (ref 135–146)
Total Bilirubin: 0.9 mg/dL (ref 0.2–1.2)
Total Protein: 7.4 g/dL (ref 6.1–8.1)
eGFR: 107 mL/min/{1.73_m2} (ref >=60)

## 2021-08-02 LAB — CBC WITH DIFFERENTIAL/PLATELET
Absolute Monocytes: 1092 cells/uL — ABNORMAL HIGH (ref 200–950)
Basophils Absolute: 73 cells/uL (ref 0–200)
Basophils Relative: 0.8 %
Eosinophils Absolute: 182 cells/uL (ref 15–500)
Eosinophils Relative: 2 %
HCT: 47.2 % (ref 38.5–50.0)
Hemoglobin: 16 g/dL (ref 13.2–17.1)
Lymphs Abs: 1738 cells/uL (ref 850–3900)
MCH: 32.7 pg (ref 27.0–33.0)
MCHC: 33.9 g/dL (ref 32.0–36.0)
MCV: 96.5 fL (ref 80.0–100.0)
MPV: 11 fL (ref 7.5–12.5)
Monocytes Relative: 12 %
Neutro Abs: 6015 cells/uL (ref 1500–7800)
Neutrophils Relative %: 66.1 %
Platelets: 127 10*3/uL — ABNORMAL LOW (ref 140–400)
RBC: 4.89 10*6/uL (ref 4.20–5.80)
RDW: 12.5 % (ref 11.0–15.0)
Total Lymphocyte: 19.1 %
WBC: 9.1 10*3/uL (ref 3.8–10.8)

## 2021-08-02 LAB — LIPID PANEL
Cholesterol: 129 mg/dL (ref <200)
HDL: 36 mg/dL — ABNORMAL LOW (ref >=40)
LDL Cholesterol (Calc): 64 mg/dL (calc)
Non-HDL Cholesterol (Calc): 93 mg/dL (calc) (ref <130)
Total CHOL/HDL Ratio: 3.6 (calc) (ref <5.0)
Triglycerides: 250 mg/dL — ABNORMAL HIGH (ref <150)

## 2021-08-02 LAB — MICROALBUMIN / CREATININE URINE RATIO
Creatinine, Urine: 33 mg/dL (ref 20–320)
Microalb Creat Ratio: 58 mcg/mg creat — ABNORMAL HIGH (ref <30)
Microalb, Ur: 1.9 mg/dL

## 2021-08-15 ENCOUNTER — Other Ambulatory Visit: Payer: Self-pay | Admitting: Family Medicine

## 2021-08-15 DIAGNOSIS — E785 Hyperlipidemia, unspecified: Secondary | ICD-10-CM

## 2021-08-15 DIAGNOSIS — R600 Localized edema: Secondary | ICD-10-CM

## 2021-11-01 ENCOUNTER — Other Ambulatory Visit: Payer: Self-pay | Admitting: Family Medicine

## 2021-11-01 DIAGNOSIS — M109 Gout, unspecified: Secondary | ICD-10-CM

## 2021-11-15 ENCOUNTER — Other Ambulatory Visit: Payer: Self-pay | Admitting: Family Medicine

## 2021-11-15 DIAGNOSIS — R600 Localized edema: Secondary | ICD-10-CM

## 2021-11-17 ENCOUNTER — Ambulatory Visit (INDEPENDENT_AMBULATORY_CARE_PROVIDER_SITE_OTHER): Payer: Medicare Other

## 2021-11-17 DIAGNOSIS — Z0000 Encounter for general adult medical examination without abnormal findings: Secondary | ICD-10-CM

## 2021-11-17 NOTE — Patient Instructions (Signed)
[name] , Thank you for taking time to come for your Medicare Wellness Visit. I appreciate your ongoing commitment to your health goals. Please review the following plan we discussed and let me know if I can assist you in the future.   Screening recommendations/referrals: Colonoscopy: done 06/03/20. Due for repeat screening colonoscopy. Please contact [facility] to schedule.  Recommended yearly ophthalmology/optometry visit for glaucoma screening and checkup Recommended yearly dental visit for hygiene and checkup  Vaccinations: Influenza vaccine: done 08/01/21 Pneumococcal vaccine: done 09/15/13; due for Prevnar20 Tdap vaccine: done 09/11/16 Shingles vaccine: Shingrix discussed. Please contact your pharmacy for coverage information.  Covid-19: done 02/18/20, 03/17/20, 09/14/20 & 02/08/21  Advanced directives: Advance directive discussed with you today. Even though you declined this today please call our office should you change your mind and we can give you the proper paperwork for you to fill out.   Conditions/risks identified: If you wish to quit smoking, help is available. For free tobacco cessation program offerings call the [facility] at 302-251-5312 or Live Well Line at (305) 664-2755. You may also visit [email or web address] or email [email or web address] for more information on other programs.   Next appointment: Follow up in one year for your annual wellness visit.   Preventive Care 62 Years and Older, Male Preventive care refers to lifestyle choices and visits with your health care provider that can promote health and wellness. What does preventive care include? A yearly physical exam. This is also called an annual well check. Dental exams once or twice a year. Routine eye exams. Ask your health care provider how often you should have your eyes checked. Personal lifestyle choices, including: Daily care of your teeth and gums. Regular  physical activity. Eating a healthy diet. Avoiding tobacco and drug use. Limiting alcohol use. Practicing safe sex. Taking low doses of aspirin every day. Taking vitamin and mineral supplements as recommended by your health care provider. What happens during an annual well check? The services and screenings done by your health care provider during your annual well check will depend on your age, overall health, lifestyle risk factors, and family history of disease. Counseling  Your health care provider may ask you questions about your: Alcohol use. Tobacco use. Drug use. Emotional well-being. Home and relationship well-being. Sexual activity. Eating habits. History of falls. Memory and ability to understand (cognition). Work and work Statistician. Screening  You may have the following tests or measurements: Height, weight, and BMI. Blood pressure. Lipid and cholesterol levels. These may be checked every 5 years, or more frequently if you are over 62 years old. Skin check. Lung cancer screening. You may have this screening every year starting at age 62 if you have a 30-pack-year history of smoking and currently smoke or have quit within the past 15 years. Fecal occult blood test (FOBT) of the stool. You may have this test every year starting at age 62. Flexible sigmoidoscopy or colonoscopy. You may have a sigmoidoscopy every 5 years or a colonoscopy every 10 years starting at age 62. Prostate cancer screening. Recommendations will vary depending on your family history and other risks. Hepatitis C blood test. Hepatitis B blood test. Sexually transmitted disease (STD) testing. Diabetes screening. This is done by checking your blood sugar (glucose) after you have not eaten for a while (fasting). You may have this done every 1-3 years. Abdominal aortic aneurysm (AAA) screening. You may need this if you are a current or former smoker. Osteoporosis. You may be  screened starting at age 62  if you are at high risk. Talk with your health care provider about your test results, treatment options, and if necessary, the need for more tests. Vaccines  Your health care provider may recommend certain vaccines, such as: Influenza vaccine. This is recommended every year. Tetanus, diphtheria, and acellular pertussis (Tdap, Td) vaccine. You may need a Td booster every 10 years. Zoster vaccine. You may need this after age 62. Pneumococcal 13-valent conjugate (PCV13) vaccine. One dose is recommended after age 62. Pneumococcal polysaccharide (PPSV23) vaccine. One dose is recommended after age 62. Talk to your health care provider about which screenings and vaccines you need and how often you need them. This information is not intended to replace advice given to you by your health care provider. Make sure you discuss any questions you have with your health care provider. Document Released: 11/12/2015 Document Revised: 07/05/2016 Document Reviewed: 08/17/2015 Elsevier Interactive Patient Education  2017 [employer] Prevention in the Home Falls can cause injuries. They can happen to people of all ages. There are many things you can do to make your home safe and to help prevent falls. What can I do on the outside of my home? Regularly fix the edges of walkways and driveways and fix any cracks. Remove anything that might make you trip as you walk through a door, such as a raised step or threshold. Trim any bushes or trees on the path to your home. Use bright outdoor lighting. Clear any walking paths of anything that might make someone trip, such as rocks or tools. Regularly check to see if handrails are loose or broken. Make sure that both sides of any steps have handrails. Any raised decks and porches should have guardrails on the edges. Have any leaves, snow, or ice cleared regularly. Use sand or salt on walking paths during winter. Clean up any spills in your garage right away. This  includes oil or grease spills. What can I do in the bathroom? Use night lights. Install grab bars by the toilet and in the tub and shower. Do not use towel bars as grab bars. Use non-skid mats or decals in the tub or shower. If you need to sit down in the shower, use a plastic, non-slip stool. Keep the floor dry. Clean up any water that spills on the floor as soon as it happens. Remove soap buildup in the tub or shower regularly. Attach bath mats securely with double-sided non-slip rug tape. Do not have throw rugs and other things on the floor that can make you trip. What can I do in the bedroom? Use night lights. Make sure that you have a light by your bed that is easy to reach. Do not use any sheets or blankets that are too big for your bed. They should not hang down onto the floor. Have a firm chair that has side arms. You can use this for support while you get dressed. Do not have throw rugs and other things on the floor that can make you trip. What can I do in the kitchen? Clean up any spills right away. Avoid walking on wet floors. Keep items that you use a lot in easy-to-reach places. If you need to reach something above you, use a strong step stool that has a grab bar. Keep electrical cords out of the way. Do not use floor polish or wax that makes floors slippery. If you must use wax, use non-skid floor wax. Do not have  throw rugs and other things on the floor that can make you trip. What can I do with my stairs? Do not leave any items on the stairs. Make sure that there are handrails on both sides of the stairs and use them. Fix handrails that are broken or loose. Make sure that handrails are as long as the stairways. Check any carpeting to make sure that it is firmly attached to the stairs. Fix any carpet that is loose or worn. Avoid having throw rugs at the top or bottom of the stairs. If you do have throw rugs, attach them to the floor with carpet tape. Make sure that you  have a light switch at the top of the stairs and the bottom of the stairs. If you do not have them, ask someone to add them for you. What else can I do to help prevent falls? Wear shoes that: Do not have high heels. Have rubber bottoms. Are comfortable and fit you well. Are closed at the toe. Do not wear sandals. If you use a stepladder: Make sure that it is fully opened. Do not climb a closed stepladder. Make sure that both sides of the stepladder are locked into place. Ask someone to hold it for you, if possible. Clearly mark and make sure that you can see: Any grab bars or handrails. First and last steps. Where the edge of each step is. Use tools that help you move around (mobility aids) if they are needed. These include: Canes. Walkers. Scooters. Crutches. Turn on the lights when you go into a dark area. Replace any light bulbs as soon as they burn out. Set up your furniture so you have a clear path. Avoid moving your furniture around. If any of your floors are uneven, fix them. If there are any pets around you, be aware of where they are. Review your medicines with your doctor. Some medicines can make you feel dizzy. This can increase your chance of falling. Ask your doctor what other things that you can do to help prevent falls. This information is not intended to replace advice given to you by your health care provider. Make sure you discuss any questions you have with your health care provider. Document Released: 08/12/2009 Document Revised: 03/23/2016 Document Reviewed: 11/20/2014 Elsevier Interactive Patient Education  2017 [employer].

## 2021-11-17 NOTE — Progress Notes (Signed)
Subjective:   Joel Bryant is a 61 y.o. male who presents for Medicare Annual/Subsequent preventive examination.  Virtual Visit via Telephone Note  I connected with  ZALMEN WRIGHTSMAN on 11/17/21 at  3:30 PM EST by telephone and verified that I am speaking with the correct person using two identifiers.  Location: Patient: home Provider: [place] Persons participating in the virtual visit: [place]   I discussed the limitations, risks, security and privacy concerns of performing an evaluation and management service by telephone and the availability of in person appointments. The patient expressed understanding and agreed to proceed.  Interactive audio and video telecommunications were attempted between this nurse and patient, however failed, due to patient having technical difficulties OR patient did not have access to video capability.  We continued and completed visit with audio only.  Some vital signs may be absent or patient reported.   Clemetine Marker, LPN   Review of Systems     Cardiac Risk Factors include: advanced age (>30men, >18 women);male gender;dyslipidemia;hypertension;sedentary lifestyle;smoking/ tobacco exposure;obesity (BMI >30kg/m2)     Objective:    Today's Vitals   11/17/21 1415  PainSc: 2    There is no height or weight on file to calculate BMI.  Advanced Directives 11/17/2021 10/28/2020 06/03/2020 03/20/2020 07/25/2018 05/22/2018 06/14/2017  Does Patient Have a Medical Advance Directive? No No No No No No No  Would patient like information on creating a medical advance directive? No - Patient declined Yes (MAU/Ambulatory/Procedural Areas - Information given) No - Patient declined No - Patient declined - No - Patient declined -    Current Medications (verified) Outpatient Encounter Medications as of 11/17/2021  Medication Sig   atenolol (TENORMIN) 50 MG tablet Take 1 tablet (50 mg total) by mouth daily.   atorvastatin (LIPITOR) 20 MG tablet  TAKE 1 TABLET BY MOUTH ONCE A DAY AT SIXIN THE EVENING   colchicine 0.6 MG tablet TAKE 1 TABLET BY MOUTH TWICE DAILY AS NEEDED. TAKE ONE AND MAY REPEAT A SECONDDOSE IN 2 HOURS, MAY STOP WHEN PAIN RESOLVES   esomeprazole (NEXIUM) 40 MG capsule Take 1 capsule (40 mg total) by mouth daily.   furosemide (LASIX) 40 MG tablet TAKE 1 TABLET BY MOUTH ONCE A DAY AS NEEDED   lisinopril (ZESTRIL) 5 MG tablet Take 1 tablet (5 mg total) by mouth daily.   meclizine (ANTIVERT) 25 MG tablet Take 1 tablet (25 mg total) by mouth 3 (three) times daily as needed for dizziness. May make drowsy   omega-3 acid ethyl esters (LOVAZA) 1 g capsule Take 2 capsules (2 g total) by mouth 2 (two) times daily.   No facility-administered encounter medications on file as of 11/17/2021.    Allergies (verified) Patient has no known allergies.   History: Past Medical History:  Diagnosis Date   Anxiety    Aortic atherosclerosis ([place]) 06/13/2018   July 2019 CT scan   Chronic LBP 09/13/2015   Chronic left hip pain 09/27/2015   Diabetes mellitus without complication ([place])    Fracture, vertebra, pathologic    Gout    Hyperlipidemia    Hypertension    Hypomagnesemia    Retinitis pigmentosa    Sleep apnea    Past Surgical History:  Procedure Laterality Date   BACK SURGERY  2001   laminectomy lumbar spine   CATARACT EXTRACTION  1992   Insert Prosthetic Lens   COLONOSCOPY WITH PROPOFOL N/A 02/28/2017   Procedure: COLONOSCOPY WITH PROPOFOL;  Surgeon: Manya Silvas, MD;  Location: [facility] END[facility]COPY;  Service: Endoscopy;  Laterality: N/A;   COLONOSCOPY WITH PROPOFOL N/A 06/03/2020   Procedure: COLONOSCOPY WITH PROPOFOL;  Surgeon: [name], MD;  Location: [facility] ENDOSCOPY;  Service: Endoscopy;  Laterality: N/A;   EYE SURGERY     KYPHOPLASTY N/A 08/08/2018   Procedure: [id number];  Surgeon: Hessie Knows, MD;  Location: [facility] ORS;  Service: Orthopedics;  Laterality: N/A;   LAMINECTOMY  2001   lumbar   MULTIPLE  TOOTH EXTRACTIONS     2018   TIBIA FRACTURE SURGERY     TONSILLECTOMY     WRIST SURGERY Right    x2   Family History  Problem Relation Age of Onset   Colon polyps Mother    Retinitis pigmentosa Mother    Retinitis pigmentosa Maternal Grandmother    Cancer Maternal Grandfather    Diabetes Paternal Grandmother    Alcohol abuse Father    Social History   Socioeconomic History   Marital status: Divorced    Spouse name: Not on file   Number of children: 2   Years of education: Not on file   Highest education level: Not on file  Occupational History   Not on file  Tobacco Use   Smoking status: Some Days    Packs/day: 0.75    Years: 1.00    Pack years: 0.75    Types: Cigarettes   Smokeless tobacco: Never   Tobacco comments:    Quit smoking for 9 years, started again Feb 2022  Vaping Use   Vaping Use: Never used  Substance and Sexual Activity   Alcohol use: Yes    Comment: 15 beers per day   Drug use: Not Currently    Types: Marijuana    Comment: Cocaine use previously; marijuana currently occasionally   Sexual activity: Not Currently  Other Topics Concern   Not on file  Social History Narrative   Pt lives alone mostly but sometimes has room mate   Social Determinants of Health   Financial Resource Strain: Low Risk    Difficulty of Paying Living Expenses: Not very hard  Food Insecurity: No Food Insecurity   Worried About Charity fundraiser in the Last Year: Never true   Ran Out of Food in the Last Year: Never true  Transportation Needs: No Transportation Needs   Lack of Transportation (Medical): No   Lack of Transportation (Non-Medical): No  Physical Activity: Inactive   Days of Exercise per Week: 0 days   Minutes of Exercise per Session: 0 min  Stress: No Stress Concern Present   Feeling of Stress : Not at all  Social Connections: Socially Isolated   Frequency of Communication with Friends and Family: More than three times a week   Frequency of Social  Gatherings with Friends and Family: More than three times a week   Attends Religious Services: Never   Marine scientist or Organizations: No   Attends Archivist Meetings: Never   Marital Status: Divorced    Tobacco Counseling Ready to quit: Not Answered Counseling given: Not Answered Tobacco comments: Quit smoking for 9 years, started again Feb 2022   Clinical Intake:  Pre-visit preparation completed: Yes  Pain : 0-10 Pain Score: 2  Pain Type: Chronic pain Pain Location: Back (hips) Pain Orientation: Upper, Mid, Lower Pain Descriptors / Indicators: Aching, Sore Pain Onset: More than a month ago Pain Frequency: Constant     Nutritional Status: BMI > 30  Obese Diabetes: Yes CBG done?: No  Did pt. bring in CBG monitor from home?: No  How often do you need to have someone help you when you read instructions, pamphlets, or other written materials from your doctor or pharmacy?: 1 - Never    Interpreter Needed?: No  Information entered by :: Clemetine Marker LPN   Activities of Daily Living In your present state of health, do you have any difficulty performing the following activities: 11/17/2021 08/01/2021  Hearing? N N  Vision? Y Y  Difficulty concentrating or making decisions? N N  Walking or climbing stairs? N N  Dressing or bathing? N N  Doing errands, shopping? N N  Preparing Food and eating ? N -  Using the Toilet? N -  In the past six months, have you accidently leaked urine? N -  Do you have problems with loss of bowel control? N -  Managing your Medications? N -  Managing your Finances? N -  Housekeeping or managing your Housekeeping? N -  Some recent data might be hidden    Patient Care Team: Steele Sizer, MD as PCP - General (Family Medicine)  Indicate any recent Medical Services you may have received from other than [facility] providers in the past year (date may be approximate).     Assessment:   This is a routine wellness examination  for [place].  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; due for exam   Dietary issues and exercise activities discussed: Current Exercise Habits: The patient does not participate in regular exercise at present, Exercise limited by: orthopedic condition(s)   Goals Addressed             This Visit's Progress    DIET - INCREASE WATER INTAKE       Recommend drinking 6-8 glasses of water per day        Depression Screen PHQ 2/9 Scores 11/17/2021 08/01/2021 01/03/2021 10/28/2020 10/14/2020 09/03/2020 09/16/2018  PHQ - 2 Score 0 0 0 0 0 0 0  PHQ- 9 Score - 0 - - - - 0  Exception Documentation - - - - - - -    Fall Risk Fall Risk  11/17/2021 08/01/2021 01/03/2021 10/28/2020 10/14/2020  Falls in the past year? 0 1 1 1 1   Number falls in past yr: 0 0 1 0 0  Injury with Fall? 0 0 0 0 0  Risk for fall due to : Impaired vision - - History of fall(s);Impaired vision -  Follow up Falls prevention discussed - - Falls prevention discussed Falls evaluation completed    FALL RISK PREVENTION PERTAINING TO THE HOME:  Any stairs in or around the home? Yes  If so, are there any without handrails? No  Home free of loose throw rugs in walkways, pet beds, electrical cords, etc? Yes  Adequate lighting in your home to reduce risk of falls? Yes   ASSISTIVE DEVICES UTILIZED TO PREVENT FALLS:  Life alert? No  Use of a cane, walker or w/c? No  Grab bars in the bathroom? Yes  Shower chair or bench in shower? No  Elevated toilet seat or a handicapped toilet? Yes   TIMED UP AND GO:  Was the test performed? No . Telephonic visit.   Cognitive Function: Normal cognitive status assessed by direct observation by this Nurse Health Advisor. No abnormalities found.          Immunizations Immunization History  Administered Date(s) Administered    Influenza Split 08/01/2011   Influenza, Seasonal, Injecte, Preservative Fre 07/11/2012   Influenza,inj,Quad PF,6+ Mos 08/13/2013, 09/13/2015, 09/07/2016, 09/19/2018, 08/01/2021   Influenza-Unspecified 08/10/2017, 08/03/2020   Moderna Sars-Covid-2 Vaccination 02/18/2020, 03/17/2020, 09/14/2020, 02/08/2021   Pneumococcal Polysaccharide-23 09/15/2013   Tdap 09/11/2016   Zoster Recombinat (Shingrix) 07/08/2019, 12/05/2019    TDAP status: Up to date  Flu Vaccine status: Up to date  Pneumococcal vaccine status: Due, Education has been provided regarding the importance of this vaccine. Advised may receive this vaccine at local pharmacy or Health Dept. Aware to provide a copy of the vaccination record if obtained from local pharmacy or Health Dept. Verbalized acceptance and understanding.  Covid-19 vaccine status: Completed vaccines  Qualifies for Shingles Vaccine? Yes   Zostavax completed No   Shingrix Completed?: Yes  Screening Tests Health Maintenance  Topic Date Due   OPHTHALMOLOGY EXAM  04/29/2018   COVID-19 Vaccine (5 - Booster for Moderna series) 04/05/2021   COLONOSCOPY (Pts 45-65yrs Insurance coverage will need to be confirmed)  06/03/2021   HEMOGLOBIN A1C  01/30/2022   FOOT EXAM  08/01/2022   TETANUS/TDAP  09/11/2026   INFLUENZA VACCINE  Completed   Hepatitis C Screening  Completed   HIV Screening  Completed   Zoster Vaccines- Shingrix  Completed   HPV VACCINES  Aged Out    Health Maintenance  Health Maintenance Due  Topic Date Due   OPHTHALMOLOGY EXAM  04/29/2018   COVID-19 Vaccine (5 - Booster for Moderna series) 04/05/2021   COLONOSCOPY (Pts 45-30yrs Insurance coverage will need to be confirmed)  06/03/2021    Colorectal cancer screening: Type of screening: Colonoscopy. Completed 06/03/20. Repeat every 1 years. Pt declines repeat screening at this time but aware to contact [facility] Dr. Haig Prophet when ready to schedule  Lung Cancer Screening: (Low Dose CT  Chest recommended if Age 51-80 years, 30 pack-year currently smoking OR have quit w/in 15years.) does not qualify.   Additional Screening:  Hepatitis C Screening: does qualify; Completed 09/03/20  Vision Screening: Recommended annual ophthalmology exams for early detection of glaucoma and other disorders of the eye. Is the patient up to date with their annual eye exam?  No  Who is the provider or what is the name of the office in which the patient attends annual eye exams? [facility].   Dental Screening: Recommended annual dental exams for proper oral hygiene  Community Resource Referral / Chronic Care Management: CRR required this visit?  No   CCM required this visit?  No      Plan:     I have personally reviewed and noted the following in the patients chart:   Medical and social history Use of alcohol, tobacco or illicit drugs  Current medications and supplements including opioid prescriptions. Patient is not currently taking opioid prescriptions. Functional ability and status Nutritional status Physical activity Advanced directives List of other physicians Hospitalizations, surgeries, and ER visits in previous 12 months Vitals Screenings to include cognitive, depression, and falls Referrals and appointments  In addition, I have reviewed and discussed with patient certain preventive protocols, quality metrics, and best practice recommendations. A written personalized care plan for preventive services as well as general preventive health recommendations were provided to patient.   Due to this being a telephonic visit, the after visit summary with patients personalized plan was offered to patient via mail or my-chart. Patient declined at this time.  Clemetine Marker, LPN   [id number]   Nurse Notes:  none

## 2021-12-30 ENCOUNTER — Other Ambulatory Visit: Payer: Self-pay | Admitting: Family Medicine

## 2021-12-30 DIAGNOSIS — R600 Localized edema: Secondary | ICD-10-CM

## 2022-01-27 NOTE — Progress Notes (Signed)
Name: Joel Bryant   MRN: [id number]    DOB: [birth year]   Date:01/30/2022 ? ?     Progress Note ? ?Subjective ? ?Chief Complaint ? ?Follow Up ? ?HPI ? ?DM II with complications. diagnosed years ago, he was on Januvia and Metformin but off medications for over 6 months and is doing well. A1C is 5.7 % He still has some neuropathy/paresthesia on toes but stable without medications, he still has microalbuminuria and is on Ace and last level improvement  Denies polyphagia, polyuria or polydipsia .  ? ?Weight loss: seen by Dr. Pryor Ochoa because he was having difficulty swallowing, he had CT neck and showed some thickness on his esophagus, he states he was advised to resume Nexium and Fexofenadine  ( but he stopped taking it Allegra) , Dr. Pryor Ochoa. He was advised to see GI, but when he had colonoscopy but poor prep and has to go back. He has some dysphagia, advised to go back to GI. He states he is not feeling hungry. He drinks alcohol daily, States got up this am and already drank 5 beers. Discussed referral to oncologist. Lost 11 lbs since last visit , he states he does not want to, feeling fine ? ?Alcoholisms: he is not ready to quit, he drinks daily and starts in am's , refuses AA referral. He came in today very excited , standing up and bouncing  ? ?Aorta Atherosclerosis: found  on CT abdomen done 05/22/2018 , he is taking statin and aspirin, no side effects  . Last LDL was down to 64 .  ? ?Gout: he states gout attacks are seldom, still has one refill left at pharmacy  ? ?Vertigo: symptoms were severe last Fall, he went to ENT and neurologist, took some meclizine and states not having symptoms at this time. He states he takes Meclizine every night to help his sleep. Advised to try stop taking it  ?  ?History of drug abuse: he states he has used all kinds of drugs, including cocaine, heroine, but has been clean for over 3 years. He states cannot go to the pain clinic because of his history.  Unchanged  ? ?Chronic back pain,  failed back surgery, also has Osteonecrosis of hips and chronic hip and knee pain: he has difficulty sleeping well, has to shift positions. Pain is daily but not severe , currently is 2-3/10  He states no recent muscle spasms.  No bowel or bladder incontinence, he states baclofen made him feel loopy but would like to try another muscle relaxer, he stopped gabapentin by himself and not interested on going back on that.   ? ?HTN: taking bp medication as prescribed, lisinopril and Atenolol, bp is okay today, he states gets dizzy when getting up fast at night, advised to get up slowly and drink more water  ? ?Smoker's cough: he started smoking at age 53 , quit for 9 years and resumed last year. He coughs daily and brings up phlegm , he denies wheezing but has SOB with intense activity. He agrees on having CT lung ? ?Patient Active Problem List  ? Diagnosis Date Noted  ? GAD (generalized anxiety disorder) 11/20/2020  ? History of drug abuse in remission ([place]) 04/30/2020  ? Closed compression fracture of thoracic vertebra ([place]) 06/13/2018  ? Fatty liver 06/13/2018  ? Aortic atherosclerosis ([place]) 06/13/2018  ? Chronic pain syndrome 09/06/2016  ? Dyslipidemia associated with type 2 diabetes mellitus ([place]) 09/27/2015  ? H/O adenomatous polyp of colon 09/27/2015  ?  Dysmetabolic syndrome 24/58/0998  ? Post Laminectomy Syndrome (L4-5 Hemilaminectomy and partial Facetectomy) 09/27/2015  ? Lumbar spondylosis 09/27/2015  ? 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 Epidural fibrosis 09/27/2015  ? Chronic low back pain (Location of Primary Source of Pain) (Bilateral) (L>R) 09/27/2015  ? Chronic left knee pain 09/27/2015  ? Lumbar spinal stenosis (L4-5) 09/27/2015  ? Lumbar foraminal stenosis (Bilateral L3-4 and L4-5) 09/27/2015  ? Lumbar facet hypertrophy (Bilateral, Multilevel) 09/27/2015  ? Lumbar facet syndrome (Bilateral) 09/27/2015  ? Bilateral hip osteonecrosis ([place]) (Bilateral Femoral Head) 09/27/2015  ? Chronic hip pain  (Location of Secondary source of pain) (Bilateral) (L>R) (secondary to osteonecrosis) 09/27/2015  ? Edema leg 09/13/2015  ? Gout 09/13/2015  ? Obstructive apnea 09/13/2015  ? Anxiety disorder 06/11/2015  ? Hypertriglyceridemia 06/11/2015  ? Dyslipidemia 06/11/2015  ? Controlled type 2 diabetes mellitus with diabetic neuropathy, without long-term current use of insulin ([place]) 06/11/2015  ? 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  ? Retinitis pigmentosa 06/11/2015  ? ? ?Past Surgical History:  ?Procedure Laterality Date  ? BACK SURGERY  2001  ? laminectomy lumbar spine  ? CATARACT EXTRACTION  1992  ? Insert Prosthetic Lens  ? COLONOSCOPY WITH PROPOFOL N/A 02/28/2017  ? Procedure: COLONOSCOPY WITH PROPOFOL;  Surgeon: Manya Silvas, MD;  Location: [facility] ENDOSCOPY;  Service: Endoscopy;  Laterality: N/A;  ? COLONOSCOPY WITH PROPOFOL N/A 06/03/2020  ? Procedure: COLONOSCOPY WITH PROPOFOL;  Surgeon: [name], MD;  Location: [facility] ENDOSCOPY;  Service: Endoscopy;  Laterality: N/A;  ? EYE SURGERY    ? KYPHOPLASTY N/A 08/08/2018  ? Procedure: [id number];  Surgeon: Hessie Knows, MD;  Location: [facility] ORS;  Service: Orthopedics;  Laterality: N/A;  ? LAMINECTOMY  2001  ? lumbar  ? MULTIPLE TOOTH EXTRACTIONS    ? 2018  ? TIBIA FRACTURE SURGERY    ? TONSILLECTOMY    ? WRIST SURGERY Right   ? x2  ? ? ?Family History  ?Problem Relation Age of Onset  ? Colon polyps Mother   ? Retinitis pigmentosa Mother   ? Retinitis pigmentosa Maternal Grandmother   ? Cancer Maternal Grandfather   ? Diabetes Paternal Grandmother   ? Alcohol abuse Father   ? ? ?Social History  ? ?Tobacco Use  ? Smoking status: Some Days  ?  Packs/day: 0.75  ?  Years: 1.00  ?  Pack years: 0.75  ?  Types: Cigarettes  ? Smokeless tobacco: Never  ? Tobacco comments:  ?  Quit smoking for 9 years, started again Feb 2022  ?Substance Use Topics  ? Alcohol use: Yes  ?  Comment: 15 beers per day  ? ? ? ?Current Outpatient Medications:  ?  atenolol (TENORMIN) 50 MG  tablet, Take 1 tablet (50 mg total) by mouth daily., Disp: 90 tablet, Rfl: 1 ?  atorvastatin (LIPITOR) 20 MG tablet, TAKE 1 TABLET BY MOUTH ONCE A DAY AT [facility] THE EVENING, Disp: 90 tablet, Rfl: 1 ?  colchicine 0.6 MG tablet, TAKE 1 TABLET BY MOUTH TWICE DAILY AS NEEDED. TAKE ONE AND MAY REPEAT A SECONDDOSE IN 2 HOURS, MAY STOP WHEN PAIN RESOLVES, Disp: 30 tablet, Rfl: 1 ?  esomeprazole (NEXIUM) 40 MG capsule, Take 1 capsule (40 mg total) by mouth daily., Disp: 90 capsule, Rfl: 1 ?  furosemide (LASIX) 40 MG tablet, TAKE 1 TABLET BY MOUTH ONCE A DAY AS NEEDED, Disp: 90 tablet, Rfl: 0 ?  lisinopril (ZESTRIL) 5 MG tablet, Take 1 tablet (5 mg total) by mouth daily., Disp:  90 tablet, Rfl: 1 ?  meclizine (ANTIVERT) 25 MG tablet, Take 1 tablet (25 mg total) by mouth 3 (three) times daily as needed for dizziness. May make drowsy, Disp: 30 tablet, Rfl: 1 ?  omega-3 acid ethyl esters (LOVAZA) 1 g capsule, Take 2 capsules (2 g total) by mouth 2 (two) times daily., Disp: 360 capsule, Rfl: 1 ? ?No Known Allergies ? ?I personally reviewed active problem list, medication list, allergies, family history, social history, health maintenance with the patient/caregiver today. ? ? ?ROS ? ?Constitutional: Negative for fever or weight change.  ?Respiratory: Negative for cough and shortness of breath.   ?Cardiovascular: Negative for chest pain or palpitations.  ?Gastrointestinal: Negative for abdominal pain, no bowel changes.  ?Musculoskeletal: Negative for gait problem or joint swelling.  ?Skin: Negative for rash.  ?Neurological: Negative for dizziness or headache.  ?No other specific complaints in a complete review of systems (except as listed in HPI above).  ? ?Objective ? ?Vitals:  ? 01/30/22 1005  ?BP: 132/84  ?Pulse: 86  ?Resp: 16  ?SpO2: 99%  ?Weight: 213 lb (96.6 kg)  ?Height: '5\' 9"'$  (1.753 m)  ? ? ?Body mass index is 31.45 kg/m?. ? ?Physical Exam ? ?Constitutional: Patient appears well-developed and well-nourished.  No distress.   ?HEENT: head atraumatic, normocephalic, pupils equal and reactive to light,, neck supple ?Cardiovascular: Normal rate, regular rhythm and normal heart sounds.  No murmur heard. No BLE edema. ?Pulmonary/Che

## 2022-01-30 ENCOUNTER — Ambulatory Visit (INDEPENDENT_AMBULATORY_CARE_PROVIDER_SITE_OTHER): Payer: Medicare Other | Admitting: Family Medicine

## 2022-01-30 ENCOUNTER — Encounter: Payer: Self-pay | Admitting: Family Medicine

## 2022-01-30 VITALS — BP 132/84 | HR 86 | Resp 16 | Ht 69.0 in | Wt 213.0 lb

## 2022-01-30 DIAGNOSIS — E1122 Type 2 diabetes mellitus with diabetic chronic kidney disease: Secondary | ICD-10-CM | POA: Diagnosis not present

## 2022-01-30 DIAGNOSIS — D696 Thrombocytopenia, unspecified: Secondary | ICD-10-CM

## 2022-01-30 DIAGNOSIS — R600 Localized edema: Secondary | ICD-10-CM

## 2022-01-30 DIAGNOSIS — E785 Hyperlipidemia, unspecified: Secondary | ICD-10-CM

## 2022-01-30 DIAGNOSIS — K219 Gastro-esophageal reflux disease without esophagitis: Secondary | ICD-10-CM

## 2022-01-30 DIAGNOSIS — E781 Pure hyperglyceridemia: Secondary | ICD-10-CM

## 2022-01-30 DIAGNOSIS — E1129 Type 2 diabetes mellitus with other diabetic kidney complication: Secondary | ICD-10-CM

## 2022-01-30 DIAGNOSIS — I700 Atherosclerosis of aorta: Secondary | ICD-10-CM

## 2022-01-30 DIAGNOSIS — Z122 Encounter for screening for malignant neoplasm of respiratory organs: Secondary | ICD-10-CM

## 2022-01-30 DIAGNOSIS — I10 Essential (primary) hypertension: Secondary | ICD-10-CM

## 2022-01-30 DIAGNOSIS — F1911 Other psychoactive substance abuse, in remission: Secondary | ICD-10-CM

## 2022-01-30 DIAGNOSIS — F1020 Alcohol dependence, uncomplicated: Secondary | ICD-10-CM

## 2022-01-30 DIAGNOSIS — G4733 Obstructive sleep apnea (adult) (pediatric): Secondary | ICD-10-CM

## 2022-01-30 DIAGNOSIS — I129 Hypertensive chronic kidney disease with stage 1 through stage 4 chronic kidney disease, or unspecified chronic kidney disease: Secondary | ICD-10-CM

## 2022-01-30 DIAGNOSIS — R1312 Dysphagia, oropharyngeal phase: Secondary | ICD-10-CM

## 2022-01-30 DIAGNOSIS — R809 Proteinuria, unspecified: Secondary | ICD-10-CM

## 2022-01-30 DIAGNOSIS — J410 Simple chronic bronchitis: Secondary | ICD-10-CM

## 2022-01-30 DIAGNOSIS — G894 Chronic pain syndrome: Secondary | ICD-10-CM

## 2022-01-30 MED ORDER — LISINOPRIL 5 MG PO TABS: 5.0000 mg | ORAL_TABLET | Freq: Every day | ORAL | 1 refills | Status: DC

## 2022-01-30 MED ORDER — ESOMEPRAZOLE MAGNESIUM 40 MG PO CPDR: 40.0000 mg | DELAYED_RELEASE_CAPSULE | Freq: Every day | ORAL | 1 refills | Status: DC

## 2022-01-30 MED ORDER — OMEGA-3-ACID ETHYL ESTERS 1 G PO CAPS: 2.0000 g | ORAL_CAPSULE | Freq: Two times a day (BID) | ORAL | 1 refills | Status: DC

## 2022-01-30 MED ORDER — ATENOLOL 50 MG PO TABS: 50.0000 mg | ORAL_TABLET | Freq: Every day | ORAL | 1 refills | Status: DC

## 2022-01-30 MED ORDER — ATORVASTATIN CALCIUM 20 MG PO TABS: ORAL_TABLET | ORAL | 1 refills | Status: DC

## 2022-01-30 MED ORDER — FUROSEMIDE 40 MG PO TABS: 40.0000 mg | ORAL_TABLET | Freq: Every day | ORAL | 0 refills | Status: DC | PRN

## 2022-02-09 LAB — CBC WITH DIFFERENTIAL/PLATELET
Absolute Monocytes: 825 cells/uL (ref 200–950)
Basophils Absolute: 51 cells/uL (ref 0–200)
Basophils Relative: 0.7 %
Eosinophils Absolute: 88 cells/uL (ref 15–500)
Eosinophils Relative: 1.2 %
HCT: 45.4 % (ref 38.5–50.0)
Hemoglobin: 15.7 g/dL (ref 13.2–17.1)
Lymphs Abs: 1986 cells/uL (ref 850–3900)
MCH: 32 pg (ref 27.0–33.0)
MCHC: 34.6 g/dL (ref 32.0–36.0)
MCV: 92.7 fL (ref 80.0–100.0)
MPV: 10.5 fL (ref 7.5–12.5)
Monocytes Relative: 11.3 %
Neutro Abs: 4351 cells/uL (ref 1500–7800)
Neutrophils Relative %: 59.6 %
Platelets: 140 10*3/uL (ref 140–400)
RBC: 4.9 10*6/uL (ref 4.20–5.80)
RDW: 12.5 % (ref 11.0–15.0)
Total Lymphocyte: 27.2 %
WBC: 7.3 10*3/uL (ref 3.8–10.8)

## 2022-02-09 LAB — COMPLETE METABOLIC PANEL WITH GFR
AG Ratio: 1.6 (calc) (ref 1.0–2.5)
ALT: 27 U/L (ref 9–46)
AST: 74 U/L — ABNORMAL HIGH (ref 10–35)
Albumin: 4.8 g/dL (ref 3.6–5.1)
Alkaline phosphatase (APISO): 90 U/L (ref 35–144)
BUN/Creatinine Ratio: 12 (calc) (ref 6–22)
BUN: 8 mg/dL (ref 7–25)
CO2: 28 mmol/L (ref 20–32)
Calcium: 9.4 mg/dL (ref 8.6–10.3)
Chloride: 89 mmol/L — ABNORMAL LOW (ref 98–110)
Creat: 0.66 mg/dL — ABNORMAL LOW (ref 0.70–1.35)
Globulin: 3 g/dL (calc) (ref 1.9–3.7)
Glucose, Bld: 99 mg/dL (ref 65–99)
Potassium: 4.1 mmol/L (ref 3.5–5.3)
Sodium: 130 mmol/L — ABNORMAL LOW (ref 135–146)
Total Bilirubin: 0.9 mg/dL (ref 0.2–1.2)
Total Protein: 7.8 g/dL (ref 6.1–8.1)
eGFR: 107 mL/min/{1.73_m2} (ref >=60)

## 2022-02-09 LAB — HEMOGLOBIN A1C
Hgb A1c MFr Bld: 5.5 % of total Hgb (ref <5.7)
Mean Plasma Glucose: 111 mg/dL
eAG (mmol/L): 6.2 mmol/L

## 2022-02-09 LAB — VITAMIN B12: Vitamin B-12: 329 pg/mL (ref 200–1100)

## 2022-02-09 LAB — VITAMIN B1: Vitamin B1 (Thiamine): 9 nmol/L (ref 8–30)

## 2022-02-13 ENCOUNTER — Other Ambulatory Visit: Payer: Self-pay | Admitting: Family Medicine

## 2022-02-13 ENCOUNTER — Telehealth: Payer: Self-pay

## 2022-02-13 ENCOUNTER — Other Ambulatory Visit: Payer: Self-pay

## 2022-02-13 DIAGNOSIS — E871 Hypo-osmolality and hyponatremia: Secondary | ICD-10-CM

## 2022-02-13 DIAGNOSIS — E162 Hypoglycemia, unspecified: Secondary | ICD-10-CM

## 2022-02-13 DIAGNOSIS — J410 Simple chronic bronchitis: Secondary | ICD-10-CM

## 2022-02-13 DIAGNOSIS — E538 Deficiency of other specified B group vitamins: Secondary | ICD-10-CM

## 2022-02-13 MED ORDER — B-12 1000 MCG SL SUBL: 1.0000 | SUBLINGUAL_TABLET | Freq: Every day | SUBLINGUAL | 1 refills | Status: AC

## 2022-02-14 NOTE — Telephone Encounter (Signed)
Completed.

## 2022-02-21 LAB — ALDOSTERONE + RENIN ACTIVITY W/ RATIO
ALDO / PRA Ratio: 1.9 Ratio (ref 0.9–28.9)
Aldosterone: 3 ng/dL
Renin Activity: 1.62 ng/mL/h (ref 0.25–5.82)

## 2022-03-13 ENCOUNTER — Other Ambulatory Visit: Payer: Self-pay

## 2022-03-13 DIAGNOSIS — Z122 Encounter for screening for malignant neoplasm of respiratory organs: Secondary | ICD-10-CM

## 2022-03-13 DIAGNOSIS — Z87891 Personal history of nicotine dependence: Secondary | ICD-10-CM

## 2022-03-13 DIAGNOSIS — F17210 Nicotine dependence, cigarettes, uncomplicated: Secondary | ICD-10-CM

## 2022-03-25 ENCOUNTER — Emergency Department: Payer: Medicare Other

## 2022-03-25 ENCOUNTER — Emergency Department
Admission: EM | Admit: 2022-03-25 | Discharge: 2022-03-25 | Disposition: A | Discharge status: Home / Self Care | Payer: Medicare Other | Attending: Emergency Medicine | Admitting: Emergency Medicine

## 2022-03-25 DIAGNOSIS — R748 Abnormal levels of other serum enzymes: Secondary | ICD-10-CM | POA: Diagnosis not present

## 2022-03-25 DIAGNOSIS — R109 Unspecified abdominal pain: Secondary | ICD-10-CM | POA: Diagnosis present

## 2022-03-25 LAB — CBC
HCT: 43 % (ref 39.0–52.0)
Hemoglobin: 14.6 g/dL (ref 13.0–17.0)
MCH: 31.7 pg (ref 26.0–34.0)
MCHC: 34 g/dL (ref 30.0–36.0)
MCV: 93.5 fL (ref 80.0–100.0)
Platelets: 116 10*3/uL — ABNORMAL LOW (ref 150–400)
RBC: 4.6 MIL/uL (ref 4.22–5.81)
RDW: 12.8 % (ref 11.5–15.5)
WBC: 6.7 10*3/uL (ref 4.0–10.5)
nRBC: 0 % (ref 0.0–0.2)

## 2022-03-25 LAB — COMPREHENSIVE METABOLIC PANEL
ALT: 28 U/L (ref 0–44)
AST: 84 U/L — ABNORMAL HIGH (ref 15–41)
Albumin: 4.5 g/dL (ref 3.5–5.0)
Alkaline Phosphatase: 98 U/L (ref 38–126)
Anion gap: 11 (ref 5–15)
BUN: 14 mg/dL (ref 8–23)
CO2: 26 mmol/L (ref 22–32)
Calcium: 8.9 mg/dL (ref 8.9–10.3)
Chloride: 92 mmol/L — ABNORMAL LOW (ref 98–111)
Creatinine, Ser: 0.72 mg/dL (ref 0.61–1.24)
GFR, Estimated: >60 mL/min (ref >60)
Glucose, Bld: 136 mg/dL — ABNORMAL HIGH (ref 70–99)
Potassium: 4 mmol/L (ref 3.5–5.1)
Sodium: 129 mmol/L — ABNORMAL LOW (ref 135–145)
Total Bilirubin: 0.9 mg/dL (ref 0.3–1.2)
Total Protein: 7.8 g/dL (ref 6.5–8.1)

## 2022-03-25 LAB — LIPASE, BLOOD: Lipase: 107 U/L — ABNORMAL HIGH (ref 11–51)

## 2022-03-25 MED ORDER — ESOMEPRAZOLE MAGNESIUM 40 MG PO CPDR: 40.0000 mg | DELAYED_RELEASE_CAPSULE | Freq: Every day | ORAL | 1 refills | Status: DC

## 2022-03-25 MED ORDER — MORPHINE SULFATE (PF) 4 MG/ML IV SOLN
4.0000 mg | Freq: Once | INTRAVENOUS | Status: AC
  Administered 2022-03-25: 4 mg via INTRAVENOUS
  Filled 2022-03-25: qty 1

## 2022-03-25 MED ORDER — LIDOCAINE VISCOUS HCL 2 % MT SOLN
15.0000 mL | Freq: Once | OROMUCOSAL | Status: AC
  Administered 2022-03-25: 15 mL via ORAL
  Filled 2022-03-25: qty 15

## 2022-03-25 MED ORDER — ALUM & MAG HYDROXIDE-SIMETH 200-200-20 MG/5ML PO SUSP
30.0000 mL | Freq: Once | ORAL | Status: AC
  Administered 2022-03-25: 30 mL via ORAL
  Filled 2022-03-25: qty 30

## 2022-03-25 MED ORDER — IOHEXOL 300 MG/ML  SOLN
100.0000 mL | Freq: Once | INTRAMUSCULAR | Status: AC | PRN
  Administered 2022-03-25: 100 mL via INTRAVENOUS

## 2022-03-25 MED ORDER — ONDANSETRON HCL 4 MG/2ML IJ SOLN
4.0000 mg | Freq: Once | INTRAMUSCULAR | Status: AC
  Administered 2022-03-25: 4 mg via INTRAVENOUS
  Filled 2022-03-25: qty 2

## 2022-03-25 NOTE — ED Notes (Signed)
Pt gives verbal consent to DC 

## 2022-03-25 NOTE — Discharge Instructions (Addendum)
Please take the Nexium 1 pill a day.  I have been you more pills to add to the prescription you had from last month.  This should help with the pain.  There might be a component of pancreatitis as well.  Your lipase is elevated slightly.  Please follow the pancreatitis eating plan that I have attached.  Please return at once if the pain worsens.  Also return if it is not any better by Monday or Tuesday.  Also return if you have nausea vomiting fever or any blood in vomit or in your stool.  Please work on getting someone to take care of your dog if you need to come back.  It would have been good to keep you for a little bit here in the hospital if you did not have to go back to take care of the dog.

## 2022-03-25 NOTE — ED Triage Notes (Signed)
Patient to ER via POV with reports of epigastric x6 months. Reports this week, he had two days where he experienced no pain but today it is the worst it has been. Reports pain in non-radiating, feels like a "hot knife", relieved when belching. Denies NVD. Has been taking Nexium without relief.   Patient also reports he had been coughing for over a year, states that he believes he may have a hernia now.

## 2022-03-25 NOTE — ED Notes (Signed)
Pt knows need for ua . 

## 2022-03-25 NOTE — ED Provider Notes (Signed)
[facility] Provider Note    Event Date/Time   First MD Initiated Contact with Patient 03/25/22 1317     (approximate)   History   Abdominal Pain   HPI  DAGAN HEINZ is a 61 y.o. male patient reports abdominal pain off and on for about 6 months.  He had gone away for several days and he ate 4 bites of chicken salad today and became sharp and knifelike.      Physical Exam   Triage Vital Signs: ED Triage Vitals [03/25/22 1204]  Enc Vitals Group     BP 117/90     Pulse Rate 64     Resp 18     Temp 98 F (36.7 C)     Temp Source Oral     SpO2 98 %     Weight      Height '5\' 9"'$  (1.753 m)     Head Circumference      Peak Flow      Pain Score 5     Pain Loc      Pain Edu?      Excl. in [place]?     Most recent vital signs: Vitals:   03/25/22 1204 03/25/22 1617  BP: 117/90 124/76  Pulse: 64 68  Resp: 18 17  Temp: 98 F (36.7 C) 98.2 F (36.8 C)  SpO2: 98% 99%     General: Awake, no distress.  CV:  Good peripheral perfusion. Heart rrr no mur Resp:  Normal effort. Lungs clr Abd:  No distention. Soft not tender Extremeties no edema   ED Results / Procedures / Treatments   Labs (all labs ordered are listed, but only abnormal results are displayed) Labs Reviewed  LIPASE, BLOOD - Abnormal; Notable for the following components:      Result Value   Lipase 107 (*)    All other components within normal limits  COMPREHENSIVE METABOLIC PANEL - Abnormal; Notable for the following components:   Sodium 129 (*)    Chloride 92 (*)    Glucose, Bld 136 (*)    AST 84 (*)    All other components within normal limits  CBC - Abnormal; Notable for the following components:   Platelets 116 (*)    All other components within normal limits  URINALYSIS, ROUTINE W REFLEX MICROSCOPIC     EKG     RADIOLOGY CT read by radiology reviewed and evaluated and interpreted by me does not show any acute findings.   PROCEDURES:  Critical Care  performed:   Procedures   MEDICATIONS ORDERED IN ED: Medications  morphine (PF) 4 MG/ML injection 4 mg (4 mg Intravenous Given 03/25/22 1351)  ondansetron (ZOFRAN) injection 4 mg (4 mg Intravenous Given 03/25/22 1350)  iohexol (OMNIPAQUE) 300 MG/ML solution 100 mL (100 mLs Intravenous Contrast Given 03/25/22 1445)  alum & mag hydroxide-simeth (MAALOX/MYLANTA) 200-200-20 MG/5ML suspension 30 mL (30 mLs Oral Given 03/25/22 1616)    And  lidocaine (XYLOCAINE) 2 % viscous mouth solution 15 mL (15 mLs Oral Given 03/25/22 1616)     IMPRESSION / MDM / ASSESSMENT AND PLAN / ED COURSE  I reviewed the triage vital signs and the nursing notes.  Patient's pain is still sharp and mid abdomen.  CT does not show any etiology.  It is possible he has a little bit of an ulcer going on.  I will give him some more Nexium to take.  He also has a mild elevation of his lipase.  I have given him instructions for pancreatitis diet and asked him to come back if he is worse.  It would have been nice to keep him in the hospital but he will not stay.  He has a dog at home and needs care and he does not have anybody to take care of it. The presentation and lab work does not seem consistent with any gallbladder problems.         FINAL CLINICAL IMPRESSION(S) / ED DIAGNOSES   Final diagnoses:  Abdominal pain, unspecified abdominal location  Elevated lipase     Rx / DC Orders   ED Discharge Orders          Ordered    esomeprazole (NEXIUM) 40 MG capsule  Daily        03/25/22 1637             Note:  This document was prepared using Dragon voice recognition software and may include unintentional dictation errors.   Nena Polio, MD 03/25/22 1640

## 2022-03-28 ENCOUNTER — Ambulatory Visit: Payer: Self-pay | Admitting: *Deleted

## 2022-03-28 NOTE — Telephone Encounter (Signed)
  Chief Complaint: abdominal pain , requesting antibiotic Symptoms: worsening abdominal pain , constant now, not able to tolerate food. Nausea when eating any food except yogurt  Frequency: started over the weekend seen in ED 03/25/22 Pertinent Negatives: Patient denies fever, no vomiting  Disposition: '[]'$ ED /'[]'$ Urgent Care (no appt availability in office) / '[]'$ Appointment(In office/virtual)/ '[]'$  [facility]/ '[]'$ Home Care/ '[x]'$ Refused Recommended Disposition /'[]'$ [facility]/ '[]'$  Follow-up with PCP Additional Notes:   Recommended appt with PCP. Patient requesting  PCP order antibiotics as recommended by ED . See ED encounter 03/25/22. Patient declined appt at this time and requesting to cancel chest x ray.  Reviewed with patient could not cancel chest x ray ordered by another provider. Please advise .   Reason for Disposition  [1] MILD pain (e.g., does not interfere with normal activities) AND [2] pain comes and goes (cramps) [3] present > 48 hours  (Exception: this same abdominal pain is a chronic symptom recurrent or ongoing AND present > 4 weeks)  Answer Assessment - Initial Assessment Questions 1. LOCATION: "Where does it hurt?"      Center of stomach , hurts with eating  2. RADIATION: "Does the pain shoot anywhere else?" (e.g., chest, back)     No  3. ONSET: "When did the pain begin?" (Minutes, hours or days ago)      Over the weekend  4. SUDDEN: "Gradual or sudden onset?"     Gradual x 2 months ago and worse now  5. PATTERN "Does the pain come and go, or is it constant?"    - If constant: "Is it getting better, staying the same, or worsening?"      (Note: Constant means the pain never goes away completely; most serious pain is constant and it progresses)     - If intermittent: "How long does it last?" "Do you have pain now?"     (Note: Intermittent means the pain goes away completely between bouts)     Constant  6. SEVERITY: "How bad is the pain?"  (e.g., Scale 1-10;  mild, moderate, or severe)    - MILD (1-3): doesn't interfere with normal activities, abdomen soft and not tender to touch     - MODERATE (4-7): interferes with normal activities or awakens from sleep, abdomen tender to touch     - SEVERE (8-10): excruciating pain, doubled over, unable to do any normal activities       Moderate to severe pain at times , difficulty tolerating foods can eat yogurt  7. RECURRENT SYMPTOM: "Have you ever had this type of stomach pain before?" If Yes, ask: "When was the last time?" and "What happened that time?"      Yes , see ED visit  8. CAUSE: "What do you think is causing the stomach pain?"     Not sure , possible pancreatitis  9. RELIEVING/AGGRAVATING FACTORS: "What makes it better or worse?" (e.g., movement, antacids, bowel movement)     Nothing 10. OTHER SYMPTOMS: "Do you have any other symptoms?" (e.g., back pain, diarrhea, fever, urination pain, vomiting)       Abdominal pain nausea when eating any food, except yogurt  Protocols used: Abdominal Pain - Male-A-AH

## 2022-03-29 NOTE — Telephone Encounter (Signed)
Pt notified, states changed his diet and will follow-up as needed

## 2022-04-04 ENCOUNTER — Encounter: Payer: Medicare Other | Admitting: Acute Care

## 2022-04-06 ENCOUNTER — Ambulatory Visit: Payer: Medicare Other

## 2022-05-27 ENCOUNTER — Other Ambulatory Visit: Payer: Self-pay | Admitting: Family Medicine

## 2022-05-27 DIAGNOSIS — I10 Essential (primary) hypertension: Secondary | ICD-10-CM

## 2022-07-31 NOTE — Progress Notes (Signed)
Name: Joel Bryant   MRN: [id number]    DOB: [birth year]   Date:08/01/2022       Progress Note  Subjective  Chief Complaint  Follow Up  HPI  DM II with complications. diagnosed years ago, he was on Januvia and Metformin but off medications for over one year now , last . A1C is 5.7 % He still has some neuropathy/paresthesia on toes but states getting slightly better.  he still has microalbuminuria and is on Ace - currently at 5 mg dose but states stopped taking it three weeks ago.  Denies polyphagia, polyuria or polydipsia . I ordered labs but he states he wants to return to get it checked due to having vertigo at this time   Weight loss: seen by Dr. Pryor Ochoa because he was having difficulty swallowing, he had CT neck and showed some thickness on his esophagus, he states he was advised to resume Nexium and Fexofenadine  ( but he stopped taking it Allegra) , Dr. Pryor Ochoa. He was advised to see GI, but when he had colonoscopy but poor prep and has to go back. He has some dysphagia, advised to go back to GI. He states he is not feeling hungry. He drinks alcohol daily, States got up this am and already drank 5 beers. Discussed referral to oncologist. Lost 11 lbs since last visit , he states he does not want to, feeling fine  Alcoholisms: he is not ready to quit, he drinks one six pack of beer per day, refuses AA referral. He states at least he is only drinking six daily used to drink much more   Aorta Atherosclerosis: found  on CT abdomen done 05/22/2018 , he usually takes Atorvastatin  and aspirin, but stopped taking Atorvastatin a few weeks ago He is still taking fish oil  Gout: he states gout attacks are seldom, he does not need a refill today   Vertigo: symptoms were severe  Fall 22,  he went to ENT and neurologist and symptoms have been sporadic since. He is currently having an episode, denies nausea or vomiting , stable tinnitus   History of drug abuse: he states he has used all kinds of drugs,  including cocaine, heroine, but has been clean for over 3 years. He states cannot go to the pain clinic because of his history of drug addiction and is in constant  pain - on his back   Chronic back pain, failed back surgery, also has Osteonecrosis of hips and chronic hip and knee pain: he has difficulty sleeping well, has to shift positions. Pain is daily but not severe , currently is 3/10  He states no recent muscle spasms.  No bowel or bladder incontinence, he used to take  baclofen made him feel loopy he stopped gabapentin by himself and not interested on going back on that.    HTN: he stopped taking lisinopril and Atenolol a few weeks ago on his own, bp today is barely above 130. He has lost a lot of weight. We will decrease dose of lisinopril to 2.5 mg and stop atenolol   Smoker's cough: he started smoking at age 44 , quit for 9 years and resumed last year. He coughs daily and brings up phlegm , he denies wheezing but has SOB with intense activity. He had to cancer chest CT but state he will re-schedule it   Unintentional weight loss and dysphagia: He states he feels like there is phlegm stuck is his throat but able to eat solids (  not a very reliable historian) , he has lost 17 lbs in the past 6 months and 28 lbs in the past year ( over 10 % ) . Explained we need to check for cancer, advised referral to GI, also get lung CT scan. He has multiple risk factors for esophageal cancer such as tobacco use and alcoholism.  Patient Active Problem List   Diagnosis Date Noted   GAD (generalized anxiety disorder) 11/20/2020   History of drug abuse in remission ([place]) 04/30/2020   Closed compression fracture of thoracic vertebra ([place]) 06/13/2018   Fatty liver 06/13/2018   Aortic atherosclerosis ([place]) 06/13/2018   Chronic pain syndrome 09/06/2016   Dyslipidemia associated with type 2 diabetes mellitus ([place]) 09/27/2015   H/O adenomatous polyp of colon 99/35/7017   Dysmetabolic syndrome 79/39/0300   Post  Laminectomy Syndrome (L4-5 Hemilaminectomy and partial Facetectomy) 09/27/2015   Lumbar spondylosis 09/27/2015   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   Epidural fibrosis 09/27/2015   Chronic low back pain (Location of Primary Source of Pain) (Bilateral) (L>R) 09/27/2015   Chronic left knee pain 09/27/2015   Lumbar spinal stenosis (L4-5) 09/27/2015   Lumbar foraminal stenosis (Bilateral L3-4 and L4-5) 09/27/2015   Lumbar facet hypertrophy (Bilateral, Multilevel) 09/27/2015   Lumbar facet syndrome (Bilateral) 09/27/2015   Bilateral hip osteonecrosis ([place]) (Bilateral Femoral Head) 09/27/2015   Chronic hip pain (Location of Secondary source of pain) (Bilateral) (L>R) (secondary to osteonecrosis) 09/27/2015   Edema leg 09/13/2015   Gout 09/13/2015   Obstructive apnea 09/13/2015   Anxiety disorder 06/11/2015   Hypertriglyceridemia 06/11/2015   Dyslipidemia 06/11/2015   Controlled type 2 diabetes mellitus with diabetic neuropathy, without long-term current use of insulin ([place]) 06/11/2015   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   Retinitis pigmentosa 06/11/2015    Past Surgical History:  Procedure Laterality Date   BACK SURGERY  2001   laminectomy lumbar spine   CATARACT EXTRACTION  1992   Insert Prosthetic Lens   COLONOSCOPY WITH PROPOFOL N/A 02/28/2017   Procedure: COLONOSCOPY WITH PROPOFOL;  Surgeon: Manya Silvas, MD;  Location: [facility] ENDOSCOPY;  Service: Endoscopy;  Laterality: N/A;   COLONOSCOPY WITH PROPOFOL N/A 06/03/2020   Procedure: COLONOSCOPY WITH PROPOFOL;  Surgeon: [name], MD;  Location: [facility] ENDOSCOPY;  Service: Endoscopy;  Laterality: N/A;   EYE SURGERY     KYPHOPLASTY N/A 08/08/2018   Procedure: [id number];  Surgeon: Hessie Knows, MD;  Location: [facility] ORS;  Service: Orthopedics;  Laterality: N/A;   LAMINECTOMY  2001   lumbar   MULTIPLE TOOTH EXTRACTIONS     2018   TIBIA FRACTURE SURGERY     TONSILLECTOMY     WRIST SURGERY Right    x2     Family History  Problem Relation Age of Onset   Colon polyps Mother    Retinitis pigmentosa Mother    Retinitis pigmentosa Maternal Grandmother    Cancer Maternal Grandfather    Diabetes Paternal Grandmother    Alcohol abuse Father     Social History   Tobacco Use   Smoking status: Some Days    Packs/day: 0.75    Years: 35.00    Total pack years: 26.25    Types: Cigarettes   Smokeless tobacco: Never   Tobacco comments:    Quit smoking for 9 years, started again Feb 2022    Started at age 32 and smokes for a total of 30 plus years   Substance Use Topics   Alcohol use: Yes    Comment:  15 beers per day     Current Outpatient Medications:    atorvastatin (LIPITOR) 20 MG tablet, TAKE 1 TABLET BY MOUTH ONCE A DAY AT SIX THE EVENING, Disp: 90 tablet, Rfl: 1   esomeprazole (NEXIUM) 40 MG capsule, Take 1 capsule (40 mg total) by mouth daily., Disp: 90 capsule, Rfl: 1   furosemide (LASIX) 40 MG tablet, Take 1 tablet (40 mg total) by mouth daily as needed., Disp: 90 tablet, Rfl: 0   omega-3 acid ethyl esters (LOVAZA) 1 g capsule, Take 2 capsules (2 g total) by mouth 2 (two) times daily., Disp: 360 capsule, Rfl: 1   colchicine 0.6 MG tablet, TAKE 1 TABLET BY MOUTH TWICE DAILY AS NEEDED. TAKE ONE AND MAY REPEAT A SECONDDOSE IN 2 HOURS, MAY STOP WHEN PAIN RESOLVES (Patient not taking: Reported on 08/01/2022), Disp: 30 tablet, Rfl: 1   Cyanocobalamin (B-12) 1000 MCG SUBL, Place 1 tablet under the tongue daily. (Patient not taking: Reported on 08/01/2022), Disp: 100 tablet, Rfl: 1   lisinopril (ZESTRIL) 2.5 MG tablet, Take 1 tablet (2.5 mg total) by mouth daily., Disp: 90 tablet, Rfl: 0   meclizine (ANTIVERT) 25 MG tablet, Take 1 tablet (25 mg total) by mouth 3 (three) times daily as needed for dizziness. May make drowsy, Disp: 30 tablet, Rfl: 0  No Known Allergies  I personally reviewed active problem list, medication list, allergies, family history, social history, health maintenance  with the patient/caregiver today.   ROS  Ten systems reviewed and is negative except as mentioned in HPI   Objective  Vitals:   08/01/22 1058  BP: 138/86  Pulse: 90  Resp: 16  SpO2: 97%  Weight: 196 lb (88.9 kg)  Height: '5\' 9"'$  (1.753 m)    Body mass index is 28.94 kg/m.  Physical Exam  Constitutional: Patient appears well-developed and well-nourished. Overweight.  No distress.  HEENT: head atraumatic, normocephalic, pupils equal and reactive to light,, neck supple Cardiovascular: Normal rate, regular rhythm and normal heart sounds.  No murmur heard. Trace BLE edema. Pulmonary/Chest: Effort normal and breath sounds normal. No respiratory distress. Abdominal: Soft.  There is no tenderness. Muscular skeletal: lumbar back pain with rom  Psychiatric: Patient has a normal mood and affect. behavior is normal. Judgment and thought content normal.   Diabetic Foot Exam: Diabetic Foot Exam - Simple   Simple Foot Form Visual Inspection No deformities, no ulcerations, no other skin breakdown bilaterally: Yes Sensation Testing Intact to touch and monofilament testing bilaterally: Yes Pulse Check Posterior Tibialis and Dorsalis pulse intact bilaterally: Yes Comments      PHQ2/9:    08/01/2022   11:00 AM 01/30/2022   10:05 AM 11/17/2021    2:20 PM 08/01/2021   10:00 AM 01/03/2021   10:32 AM  Depression screen PHQ 2/9  Decreased Interest 0 0 0 0 0  Down, Depressed, Hopeless 0 0 0 0 0  PHQ - 2 Score 0 0 0 0 0  Altered sleeping 0 0  0   Tired, decreased energy 0 0  0   Change in appetite 0 0  0   Feeling bad or failure about yourself  0 0  0   Trouble concentrating 0 0  0   Moving slowly or fidgety/restless 0 0  0   Suicidal thoughts 0 0  0   PHQ-9 Score 0 0  0   Difficult doing work/chores    Not difficult at all     phq 9 is negative  Fall Risk:    08/01/2022   10:59 AM 01/30/2022   10:05 AM 11/17/2021    2:22 PM 08/01/2021   10:00 AM 01/03/2021   10:32 AM  Fall Risk    Falls in the past year? 0 0 0 1 1  Number falls in past yr: 0 0 0 0 1  Injury with Fall? 0 0 0 0 0  Risk for fall due to : No Fall Risks No Fall Risks Impaired vision    Follow up Falls prevention discussed Falls prevention discussed Falls prevention discussed        Functional Status Survey: Is the patient deaf or have difficulty hearing?: No Does the patient have difficulty seeing, even when wearing glasses/contacts?: Yes Does the patient have difficulty concentrating, remembering, or making decisions?: No Does the patient have difficulty walking or climbing stairs?: Yes Does the patient have difficulty dressing or bathing?: No Does the patient have difficulty doing errands alone such as visiting a doctor's office or shopping?: No    Assessment & Plan  1. Type 2 diabetes mellitus with microalbuminuria, without long-term current use of insulin (HCC)  - HM Diabetes Foot Exam - Urine Microalbumin w/creat. ratio - Hemoglobin A1c  2. Need for immunization against influenza  - Flu Vaccine QUAD 6+ mos PF IM (Fluarix Quad PF)  3. Pharyngoesophageal dysphagia  - Ambulatory referral to Gastroenterology  4. Hypertriglyceridemia  - Lipid panel  5. Gastroesophageal reflux disease without esophagitis  - Ambulatory referral to Gastroenterology  6. Smokers' cough ([place])   7. Alcoholism ([place])   8. Hypertension associated with chronic kidney disease due to type 2 diabetes mellitus ([place])   9. Aortic atherosclerosis ([place])  He needs to resume statin therapy daily  10. History of drug abuse in remission ([place])   11. Vertigo  - meclizine (ANTIVERT) 25 MG tablet; Take 1 tablet (25 mg total) by mouth 3 (three) times daily as needed for dizziness. May make drowsy  Dispense: 30 tablet; Refill: 0  12. Tinnitus of both ears  - meclizine (ANTIVERT) 25 MG tablet; Take 1 tablet (25 mg total) by mouth 3 (three) times daily as needed for dizziness. May make drowsy  Dispense: 30  tablet; Refill: 0  13. Essential hypertension  - lisinopril (ZESTRIL) 2.5 MG tablet; Take 1 tablet (2.5 mg total) by mouth daily.  Dispense: 90 tablet; Refill: 0 - CBC with Differential/Platelet - COMPLETE METABOLIC PANEL WITH GFR  14. B12 deficiency  - Vitamin B12  15. Mild protein-calorie malnutrition (HCC)  - CBC with Differential/Platelet - COMPLETE METABOLIC PANEL WITH GFR - HIV Antibody (routine testing w rflx) - TSH - Sedimentation rate - C-reactive protein  16. Unintentional weight loss  - HIV Antibody (routine testing w rflx) - TSH - Sedimentation rate - C-reactive protein

## 2022-08-01 ENCOUNTER — Encounter: Payer: Self-pay | Admitting: Family Medicine

## 2022-08-01 ENCOUNTER — Ambulatory Visit (INDEPENDENT_AMBULATORY_CARE_PROVIDER_SITE_OTHER): Payer: Medicare Other | Admitting: Family Medicine

## 2022-08-01 VITALS — BP 138/86 | HR 90 | Resp 16 | Ht 69.0 in | Wt 196.0 lb

## 2022-08-01 DIAGNOSIS — I10 Essential (primary) hypertension: Secondary | ICD-10-CM

## 2022-08-01 DIAGNOSIS — H9313 Tinnitus, bilateral: Secondary | ICD-10-CM

## 2022-08-01 DIAGNOSIS — R1314 Dysphagia, pharyngoesophageal phase: Secondary | ICD-10-CM

## 2022-08-01 DIAGNOSIS — F1911 Other psychoactive substance abuse, in remission: Secondary | ICD-10-CM

## 2022-08-01 DIAGNOSIS — Z23 Encounter for immunization: Secondary | ICD-10-CM | POA: Diagnosis not present

## 2022-08-01 DIAGNOSIS — R809 Proteinuria, unspecified: Secondary | ICD-10-CM

## 2022-08-01 DIAGNOSIS — E538 Deficiency of other specified B group vitamins: Secondary | ICD-10-CM

## 2022-08-01 DIAGNOSIS — E781 Pure hyperglyceridemia: Secondary | ICD-10-CM | POA: Diagnosis not present

## 2022-08-01 DIAGNOSIS — R42 Dizziness and giddiness: Secondary | ICD-10-CM

## 2022-08-01 DIAGNOSIS — K219 Gastro-esophageal reflux disease without esophagitis: Secondary | ICD-10-CM

## 2022-08-01 DIAGNOSIS — R634 Abnormal weight loss: Secondary | ICD-10-CM

## 2022-08-01 DIAGNOSIS — E1129 Type 2 diabetes mellitus with other diabetic kidney complication: Secondary | ICD-10-CM

## 2022-08-01 DIAGNOSIS — F1020 Alcohol dependence, uncomplicated: Secondary | ICD-10-CM

## 2022-08-01 DIAGNOSIS — J410 Simple chronic bronchitis: Secondary | ICD-10-CM

## 2022-08-01 DIAGNOSIS — E1122 Type 2 diabetes mellitus with diabetic chronic kidney disease: Secondary | ICD-10-CM

## 2022-08-01 DIAGNOSIS — I700 Atherosclerosis of aorta: Secondary | ICD-10-CM

## 2022-08-01 DIAGNOSIS — E441 Mild protein-calorie malnutrition: Secondary | ICD-10-CM

## 2022-08-01 DIAGNOSIS — I129 Hypertensive chronic kidney disease with stage 1 through stage 4 chronic kidney disease, or unspecified chronic kidney disease: Secondary | ICD-10-CM

## 2022-08-01 MED ORDER — MECLIZINE HCL 25 MG PO TABS: 25.0000 mg | ORAL_TABLET | Freq: Three times a day (TID) | ORAL | 0 refills | Status: DC | PRN

## 2022-08-01 MED ORDER — LISINOPRIL 2.5 MG PO TABS: 2.5000 mg | ORAL_TABLET | Freq: Every day | ORAL | 0 refills | Status: DC

## 2022-08-01 NOTE — Patient Instructions (Signed)
Radiology: [phone] 

## 2022-08-12 ENCOUNTER — Other Ambulatory Visit: Payer: Self-pay | Admitting: Family Medicine

## 2022-08-12 DIAGNOSIS — I700 Atherosclerosis of aorta: Secondary | ICD-10-CM

## 2022-08-12 DIAGNOSIS — E781 Pure hyperglyceridemia: Secondary | ICD-10-CM

## 2022-08-12 DIAGNOSIS — R600 Localized edema: Secondary | ICD-10-CM

## 2022-10-05 ENCOUNTER — Other Ambulatory Visit: Payer: Self-pay | Admitting: Family Medicine

## 2022-10-05 DIAGNOSIS — E785 Hyperlipidemia, unspecified: Secondary | ICD-10-CM

## 2022-10-27 NOTE — Progress Notes (Unsigned)
Name: Joel Bryant   MRN: [id number]    DOB: 1961-01-25   Date:10/27/2022       Progress Note  Subjective  Chief Complaint  Follow Up  HPI  DM II with complications. diagnosed years ago, he was on Januvia and Metformin but off medications for over one year now , last . A1C is 5.7 % He still has some neuropathy/paresthesia on toes but states getting slightly better.  he still has microalbuminuria and is on Ace - currently at 5 mg dose but states stopped taking it three weeks ago.  Denies polyphagia, polyuria or polydipsia . I ordered labs but he states he wants to return to get it checked due to having vertigo at this time   Weight loss: seen by Dr. Pryor Ochoa because he was having difficulty swallowing, he had CT neck and showed some thickness on his esophagus, he states he was advised to resume Nexium and Fexofenadine  ( but he stopped taking it Allegra) , Dr. Pryor Ochoa. He was advised to see GI, but when he had colonoscopy but poor prep and has to go back. He has some dysphagia, advised to go back to GI. He states he is not feeling hungry. He drinks alcohol daily, States got up this am and already drank 5 beers. Discussed referral to oncologist. Lost 11 lbs since last visit , he states he does not want to, feeling fine  Alcoholisms: he is not ready to quit, he drinks one six pack of beer per day, refuses AA referral. He states at least he is only drinking six daily used to drink much more   Aorta Atherosclerosis: found  on CT abdomen done 05/22/2018 , he usually takes Atorvastatin  and aspirin, but stopped taking Atorvastatin a few weeks ago He is still taking fish oil  Gout: he states gout attacks are seldom, he does not need a refill today   Vertigo: symptoms were severe  Fall 22,  he went to ENT and neurologist and symptoms have been sporadic since. He is currently having an episode, denies nausea or vomiting , stable tinnitus   History of drug abuse: he states he has used all kinds of drugs,  including cocaine, heroine, but has been clean for over 3 years. He states cannot go to the pain clinic because of his history of drug addiction and is in constant  pain - on his back   Chronic back pain, failed back surgery, also has Osteonecrosis of hips and chronic hip and knee pain: he has difficulty sleeping well, has to shift positions. Pain is daily but not severe , currently is 3/10  He states no recent muscle spasms.  No bowel or bladder incontinence, he used to take  baclofen made him feel loopy he stopped gabapentin by himself and not interested on going back on that.    HTN: he stopped taking lisinopril and Atenolol a few weeks ago on his own, bp today is barely above 130. He has lost a lot of weight. We will decrease dose of lisinopril to 2.5 mg and stop atenolol   Smoker's cough: he started smoking at age 54 , quit for 9 years and resumed last year. He coughs daily and brings up phlegm , he denies wheezing but has SOB with intense activity. He had to cancer chest CT but state he will re-schedule it   Unintentional weight loss and dysphagia: He states he feels like there is phlegm stuck is his throat but able to eat solids (  not a very reliable historian) , he has lost 17 lbs in the past 6 months and 28 lbs in the past year ( over 10 % ) . Explained we need to check for cancer, advised referral to GI, also get lung CT scan. He has multiple risk factors for esophageal cancer such as tobacco use and alcoholism.  Patient Active Problem List   Diagnosis Date Noted   GAD (generalized anxiety disorder) 11/20/2020   History of drug abuse in remission ([place]) 04/30/2020   Closed compression fracture of thoracic vertebra ([place]) 06/13/2018   Fatty liver 06/13/2018   Aortic atherosclerosis ([place]) 06/13/2018   Chronic pain syndrome 09/06/2016   Dyslipidemia associated with type 2 diabetes mellitus ([place]) 09/27/2015   H/O adenomatous polyp of colon 99/35/7017   Dysmetabolic syndrome 79/39/0300   Post  Laminectomy Syndrome (L4-5 Hemilaminectomy and partial Facetectomy) 09/27/2015   Lumbar spondylosis 09/27/2015   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   Epidural fibrosis 09/27/2015   Chronic low back pain (Location of Primary Source of Pain) (Bilateral) (L>R) 09/27/2015   Chronic left knee pain 09/27/2015   Lumbar spinal stenosis (L4-5) 09/27/2015   Lumbar foraminal stenosis (Bilateral L3-4 and L4-5) 09/27/2015   Lumbar facet hypertrophy (Bilateral, Multilevel) 09/27/2015   Lumbar facet syndrome (Bilateral) 09/27/2015   Bilateral hip osteonecrosis ([place]) (Bilateral Femoral Head) 09/27/2015   Chronic hip pain (Location of Secondary source of pain) (Bilateral) (L>R) (secondary to osteonecrosis) 09/27/2015   Edema leg 09/13/2015   Gout 09/13/2015   Obstructive apnea 09/13/2015   Anxiety disorder 06/11/2015   Hypertriglyceridemia 06/11/2015   Dyslipidemia 06/11/2015   Controlled type 2 diabetes mellitus with diabetic neuropathy, without long-term current use of insulin ([place]) 06/11/2015   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   Retinitis pigmentosa 06/11/2015    Past Surgical History:  Procedure Laterality Date   BACK SURGERY  2001   laminectomy lumbar spine   CATARACT EXTRACTION  1992   Insert Prosthetic Lens   COLONOSCOPY WITH PROPOFOL N/A 02/28/2017   Procedure: COLONOSCOPY WITH PROPOFOL;  Surgeon: Manya Silvas, MD;  Location: [facility] ENDOSCOPY;  Service: Endoscopy;  Laterality: N/A;   COLONOSCOPY WITH PROPOFOL N/A 06/03/2020   Procedure: COLONOSCOPY WITH PROPOFOL;  Surgeon: [name], MD;  Location: [facility] ENDOSCOPY;  Service: Endoscopy;  Laterality: N/A;   EYE SURGERY     KYPHOPLASTY N/A 08/08/2018   Procedure: [id number];  Surgeon: Hessie Knows, MD;  Location: [facility] ORS;  Service: Orthopedics;  Laterality: N/A;   LAMINECTOMY  2001   lumbar   MULTIPLE TOOTH EXTRACTIONS     2018   TIBIA FRACTURE SURGERY     TONSILLECTOMY     WRIST SURGERY Right    x2     Family History  Problem Relation Age of Onset   Colon polyps Mother    Retinitis pigmentosa Mother    Retinitis pigmentosa Maternal Grandmother    Cancer Maternal Grandfather    Diabetes Paternal Grandmother    Alcohol abuse Father     Social History   Tobacco Use   Smoking status: Some Days    Packs/day: 0.75    Years: 35.00    Total pack years: 26.25    Types: Cigarettes   Smokeless tobacco: Never   Tobacco comments:    Quit smoking for 9 years, started again Feb 2022    Started at age 32 and smokes for a total of 30 plus years   Substance Use Topics   Alcohol use: Yes    Comment:  15 beers per day     Current Outpatient Medications:    atorvastatin (LIPITOR) 20 MG tablet, TAKE 1 TABLET BY MOUTH ONCE A DAY AT [facility] THE EVENING, Disp: 90 tablet, Rfl: 1   colchicine 0.6 MG tablet, TAKE 1 TABLET BY MOUTH TWICE DAILY AS NEEDED. TAKE ONE AND MAY REPEAT A SECONDDOSE IN 2 HOURS, MAY STOP WHEN PAIN RESOLVES (Patient not taking: Reported on 08/01/2022), Disp: 30 tablet, Rfl: 1   Cyanocobalamin (B-12) 1000 MCG SUBL, Place 1 tablet under the tongue daily. (Patient not taking: Reported on 08/01/2022), Disp: 100 tablet, Rfl: 1   esomeprazole (NEXIUM) 40 MG capsule, Take 1 capsule (40 mg total) by mouth daily., Disp: 90 capsule, Rfl: 1   furosemide (LASIX) 40 MG tablet, TAKE 1 TABLET BY MOUTH ONCE A DAY AS NEEDED, Disp: 90 tablet, Rfl: 0   lisinopril (ZESTRIL) 2.5 MG tablet, Take 1 tablet (2.5 mg total) by mouth daily., Disp: 90 tablet, Rfl: 0   meclizine (ANTIVERT) 25 MG tablet, Take 1 tablet (25 mg total) by mouth 3 (three) times daily as needed for dizziness. May make drowsy, Disp: 30 tablet, Rfl: 0   omega-3 acid ethyl esters (LOVAZA) 1 g capsule, TAKE 2 CAPSULES BY MOUTH 2 TIMES DAILY, Disp: 360 capsule, Rfl: 1  No Known Allergies  I personally reviewed active problem list, medication list, allergies, family history, social history, health maintenance with the patient/caregiver  today.   ROS  ***  Objective  There were no vitals filed for this visit.  There is no height or weight on file to calculate BMI.  Physical Exam ***  No results found for this or any previous visit (from the past 2160 hour(s)).   PHQ2/9:    08/01/2022   11:00 AM 01/30/2022   10:05 AM 11/17/2021    2:20 PM 08/01/2021   10:00 AM 01/03/2021   10:32 AM  Depression screen PHQ 2/9  Decreased Interest 0 0 0 0 0  Down, Depressed, Hopeless 0 0 0 0 0  PHQ - 2 Score 0 0 0 0 0  Altered sleeping 0 0  0   Tired, decreased energy 0 0  0   Change in appetite 0 0  0   Feeling bad or failure about yourself  0 0  0   Trouble concentrating 0 0  0   Moving slowly or fidgety/restless 0 0  0   Suicidal thoughts 0 0  0   PHQ-9 Score 0 0  0   Difficult doing work/chores    Not difficult at all     phq 9 is {gen pos [id number]}   Fall Risk:    08/01/2022   10:59 AM 01/30/2022   10:05 AM 11/17/2021    2:22 PM 08/01/2021   10:00 AM 01/03/2021   10:32 AM  Fall Risk   Falls in the past year? 0 0 0 1 1  Number falls in past yr: 0 0 0 0 1  Injury with Fall? 0 0 0 0 0  Risk for fall due to : No Fall Risks No Fall Risks Impaired vision    Follow up Falls prevention discussed Falls prevention discussed Falls prevention discussed        Functional Status Survey:      Assessment & Plan  *** There are no diagnoses linked to this encounter.

## 2022-10-31 ENCOUNTER — Encounter: Payer: Self-pay | Admitting: Family Medicine

## 2022-10-31 ENCOUNTER — Ambulatory Visit (INDEPENDENT_AMBULATORY_CARE_PROVIDER_SITE_OTHER): Payer: Medicare Other | Admitting: Family Medicine

## 2022-10-31 VITALS — BP 122/80 | HR 70 | Resp 16 | Ht 69.0 in | Wt 202.0 lb

## 2022-10-31 DIAGNOSIS — E1140 Type 2 diabetes mellitus with diabetic neuropathy, unspecified: Secondary | ICD-10-CM | POA: Diagnosis not present

## 2022-10-31 DIAGNOSIS — I10 Essential (primary) hypertension: Secondary | ICD-10-CM | POA: Diagnosis not present

## 2022-10-31 DIAGNOSIS — M879 Osteonecrosis, unspecified: Secondary | ICD-10-CM | POA: Diagnosis not present

## 2022-10-31 DIAGNOSIS — E785 Hyperlipidemia, unspecified: Secondary | ICD-10-CM | POA: Diagnosis not present

## 2022-10-31 DIAGNOSIS — K219 Gastro-esophageal reflux disease without esophagitis: Secondary | ICD-10-CM | POA: Diagnosis not present

## 2022-10-31 DIAGNOSIS — F1020 Alcohol dependence, uncomplicated: Secondary | ICD-10-CM

## 2022-10-31 DIAGNOSIS — I700 Atherosclerosis of aorta: Secondary | ICD-10-CM

## 2022-10-31 DIAGNOSIS — Z1211 Encounter for screening for malignant neoplasm of colon: Secondary | ICD-10-CM | POA: Diagnosis not present

## 2022-10-31 DIAGNOSIS — E781 Pure hyperglyceridemia: Secondary | ICD-10-CM | POA: Diagnosis not present

## 2022-10-31 DIAGNOSIS — F1911 Other psychoactive substance abuse, in remission: Secondary | ICD-10-CM

## 2022-10-31 DIAGNOSIS — E441 Mild protein-calorie malnutrition: Secondary | ICD-10-CM | POA: Diagnosis not present

## 2022-10-31 DIAGNOSIS — E1129 Type 2 diabetes mellitus with other diabetic kidney complication: Secondary | ICD-10-CM | POA: Diagnosis not present

## 2022-10-31 DIAGNOSIS — R634 Abnormal weight loss: Secondary | ICD-10-CM | POA: Diagnosis not present

## 2022-10-31 MED ORDER — LISINOPRIL 5 MG PO TABS: 5.0000 mg | ORAL_TABLET | Freq: Every day | ORAL | 1 refills | Status: DC

## 2022-10-31 MED ORDER — ESOMEPRAZOLE MAGNESIUM 40 MG PO CPDR: 40.0000 mg | DELAYED_RELEASE_CAPSULE | Freq: Every day | ORAL | 1 refills | Status: DC

## 2022-10-31 MED ORDER — ATENOLOL 50 MG PO TABS: 50.0000 mg | ORAL_TABLET | Freq: Every day | ORAL | 1 refills | Status: DC

## 2022-11-01 LAB — COMPLETE METABOLIC PANEL WITH GFR
AG Ratio: 1.6 (calc) (ref 1.0–2.5)
ALT: 35 U/L (ref 9–46)
AST: 105 U/L — ABNORMAL HIGH (ref 10–35)
Albumin: 5 g/dL (ref 3.6–5.1)
Alkaline phosphatase (APISO): 106 U/L (ref 35–144)
BUN/Creatinine Ratio: 18 (calc) (ref 6–22)
BUN: 12 mg/dL (ref 7–25)
CO2: 25 mmol/L (ref 20–32)
Calcium: 9.4 mg/dL (ref 8.6–10.3)
Chloride: 95 mmol/L — ABNORMAL LOW (ref 98–110)
Creat: 0.66 mg/dL — ABNORMAL LOW (ref 0.70–1.35)
Globulin: 3.1 g/dL (calc) (ref 1.9–3.7)
Glucose, Bld: 108 mg/dL — ABNORMAL HIGH (ref 65–99)
Potassium: 3.9 mmol/L (ref 3.5–5.3)
Sodium: 133 mmol/L — ABNORMAL LOW (ref 135–146)
Total Bilirubin: 1.1 mg/dL (ref 0.2–1.2)
Total Protein: 8.1 g/dL (ref 6.1–8.1)
eGFR: 106 mL/min/{1.73_m2} (ref >=60)

## 2022-11-01 LAB — TSH: TSH: 0.66 mIU/L (ref 0.40–4.50)

## 2022-11-01 LAB — HEMOGLOBIN A1C
Hgb A1c MFr Bld: 5.5 % of total Hgb (ref <5.7)
Mean Plasma Glucose: 111 mg/dL
eAG (mmol/L): 6.2 mmol/L

## 2022-11-01 LAB — MICROALBUMIN / CREATININE URINE RATIO
Creatinine, Urine: 38 mg/dL (ref 20–320)
Microalb Creat Ratio: 429 mcg/mg creat — ABNORMAL HIGH (ref <30)
Microalb, Ur: 16.3 mg/dL

## 2022-11-01 LAB — LIPID PANEL
Cholesterol: 191 mg/dL (ref <200)
HDL: 67 mg/dL (ref >=40)
LDL Cholesterol (Calc): 92 mg/dL (calc)
Non-HDL Cholesterol (Calc): 124 mg/dL (calc) (ref <130)
Total CHOL/HDL Ratio: 2.9 (calc) (ref <5.0)
Triglycerides: 230 mg/dL — ABNORMAL HIGH (ref <150)

## 2022-11-01 LAB — CBC WITH DIFFERENTIAL/PLATELET
Absolute Monocytes: 752 cells/uL (ref 200–950)
Basophils Absolute: 38 cells/uL (ref 0–200)
Basophils Relative: 0.5 %
Eosinophils Absolute: 68 cells/uL (ref 15–500)
Eosinophils Relative: 0.9 %
HCT: 41.7 % (ref 38.5–50.0)
Hemoglobin: 14.7 g/dL (ref 13.2–17.1)
Lymphs Abs: 1778 cells/uL (ref 850–3900)
MCH: 32.3 pg (ref 27.0–33.0)
MCHC: 35.3 g/dL (ref 32.0–36.0)
MCV: 91.6 fL (ref 80.0–100.0)
MPV: 11 fL (ref 7.5–12.5)
Monocytes Relative: 9.9 %
Neutro Abs: 4963 cells/uL (ref 1500–7800)
Neutrophils Relative %: 65.3 %
Platelets: 95 10*3/uL — ABNORMAL LOW (ref 140–400)
RBC: 4.55 10*6/uL (ref 4.20–5.80)
RDW: 12.5 % (ref 11.0–15.0)
Total Lymphocyte: 23.4 %
WBC: 7.6 10*3/uL (ref 3.8–10.8)

## 2022-11-01 LAB — HIV ANTIBODY (ROUTINE TESTING W REFLEX): HIV 1&2 Ab, 4th Generation: NONREACTIVE

## 2022-11-01 LAB — C-REACTIVE PROTEIN: CRP: 1.3 mg/L (ref <8.0)

## 2022-11-01 LAB — SEDIMENTATION RATE: Sed Rate: 17 mm/h (ref 0–20)

## 2022-11-01 LAB — VITAMIN B12: Vitamin B-12: 773 pg/mL (ref 200–1100)

## 2022-11-03 ENCOUNTER — Other Ambulatory Visit: Payer: Self-pay

## 2022-11-03 DIAGNOSIS — R809 Proteinuria, unspecified: Secondary | ICD-10-CM

## 2022-11-21 ENCOUNTER — Telehealth (INDEPENDENT_AMBULATORY_CARE_PROVIDER_SITE_OTHER): Payer: 59 | Admitting: Physician Assistant

## 2022-11-21 DIAGNOSIS — Z0000 Encounter for general adult medical examination without abnormal findings: Secondary | ICD-10-CM

## 2022-11-21 NOTE — Progress Notes (Signed)
Annual Wellness Visit  Virtual Visit via Telephone Note  I connected with Joel Bryant on 11/21/22 at  2:40 PM EST by telephone and verified that I am speaking with the correct person using two identifiers.  Location: Patient: At home  Provider: [place], [place]    I discussed the limitations, risks, security and privacy concerns of performing an evaluation and management service by telephone and the availability of in person appointments. I also discussed with the patient that there may be a patient responsible charge related to this service. The patient expressed understanding and agreed to proceed.  Today's Provider: Talitha Givens, MHS, PA-C Introduced myself to the patient as a PA-C and provided education on APPs in clinical practice.    Subjective:   Joel Bryant is a 62 y.o. male who presents for Medicare Annual/Subsequent preventive examination.  Review of Systems:   Cardiac Risk Factors include: hypertension;male gender;dyslipidemia;sedentary lifestyle     Objective:    Vitals: There were no vitals taken for this visit.  There is no height or weight on file to calculate BMI.     03/25/2022   12:05 PM 11/17/2021    2:21 PM 10/28/2020    9:59 AM 06/03/2020    8:57 AM 03/20/2020    6:44 PM 07/25/2018    8:34 AM 05/22/2018    7:47 AM  Advanced Directives  Does Patient Have a Medical Advance Directive? No No No No No No No  Would patient like information on creating a medical advance directive?  No - Patient declined Yes (MAU/Ambulatory/Procedural Areas - Information given) No - Patient declined No - Patient declined  No - Patient declined    Tobacco Social History   Tobacco Use  Smoking Status Some Days   Packs/day: 0.75   Years: 35.00   Total pack years: 26.25   Types: Cigarettes  Smokeless Tobacco Never  Tobacco Comments   Quit smoking for 9 years, started again Feb 2022   Started at age 64 and smokes for a total of 30 plus  years      Ready to quit: Not Answered Counseling given: Not Answered Tobacco comments: Quit smoking for 9 years, started again Feb 2022 Started at age 49 and smokes for a total of 30 plus years    Clinical Intake:  Pre-visit preparation completed: Yes  Pain : No/denies pain     Nutritional Status: BMI 25 -29 Overweight Nutritional Risks: None Diabetes: Yes CBG done?: No Did pt. bring in CBG monitor from home?: No  How often do you need to have someone help you when you read instructions, pamphlets, or other written materials from your doctor or pharmacy?: 1 - Never What is the last grade level you completed in school?: 12th grade  Interpreter Needed?: No     Past Medical History:  Diagnosis Date   Anxiety    Aortic atherosclerosis ([place]) 06/13/2018   July 2019 CT scan   Chronic LBP 09/13/2015   Chronic left hip pain 09/27/2015   Diabetes mellitus without complication ([place])    Fracture, vertebra, pathologic    Gout    Hyperlipidemia    Hypertension    Hypomagnesemia    Retinitis pigmentosa    Sleep apnea    Past Surgical History:  Procedure Laterality Date   BACK SURGERY  2001   laminectomy lumbar spine   CATARACT EXTRACTION  1992   Insert Prosthetic Lens   COLONOSCOPY WITH PROPOFOL N/A 02/28/2017  Procedure: COLONOSCOPY WITH PROPOFOL;  Surgeon: Manya Silvas, MD;  [facility] END[facility];  Service: Endoscopy;  Laterality: N/A;   COLONOSCOPY WITH PROPOFOL N/A 06/03/2020   Procedure: COLONOSCOPY WITH PROPOFOL;  Surgeon: [name], MD;  Location: [facility] ENDOSCOPY;  Service: Endoscopy;  Laterality: N/A;   EYE SURGERY     KYPHOPLASTY N/A 08/08/2018   Procedure: [id number];  Surgeon: Hessie Knows, MD;  Location: [facility] ORS;  Service: Orthopedics;  Laterality: N/A;   LAMINECTOMY  2001   lumbar   MULTIPLE TOOTH EXTRACTIONS     2018   TIBIA FRACTURE SURGERY     TONSILLECTOMY     WRIST SURGERY Right    x2   Family History  Problem Relation Age of  Onset   Colon polyps Mother    Retinitis pigmentosa Mother    Retinitis pigmentosa Maternal Grandmother    Cancer Maternal Grandfather    Diabetes Paternal Grandmother    Alcohol abuse Father    Social History   Socioeconomic History   Marital status: Divorced    Spouse name: Not on file   Number of children: 2   Years of education: Not on file   Highest education level: Not on file  Occupational History   Not on file  Tobacco Use   Smoking status: Some Days    Packs/day: 0.75    Years: 35.00    Total pack years: 26.25    Types: Cigarettes   Smokeless tobacco: Never   Tobacco comments:    Quit smoking for 9 years, started again Feb 2022    Started at age 63 and smokes for a total of 30 plus years   Vaping Use   Vaping Use: Never used  Substance and Sexual Activity   Alcohol use: Yes    Comment: 15 beers per day   Drug use: Not Currently    Types: Marijuana, "Crack" cocaine, Cocaine, Other-see comments    Comment: Cocaine use previously; marijuana currently occasionally   Sexual activity: Not Currently  Other Topics Concern   Not on file  Social History Narrative   Not on file   Social Determinants of Health   Financial Resource Strain: Low Risk  (11/17/2021)   Overall Financial Resource Strain (CARDIA)    Difficulty of Paying Living Expenses: Not very hard  Food Insecurity: No Food Insecurity (11/17/2021)   Hunger Vital Sign    Worried About Running Out of Food in the Last Year: Never true    Ran Out of Food in the Last Year: Never true  Transportation Needs: No Transportation Needs (11/17/2021)   PRAPARE - Hydrologist (Medical): No    Lack of Transportation (Non-Medical): No  Physical Activity: Inactive (11/17/2021)   Exercise Vital Sign    Days of Exercise per Week: 0 days    Minutes of Exercise per Session: 0 min  Stress: No Stress Concern Present (11/17/2021)   [place]    Feeling of Stress : Not at all  Social Connections: Socially Isolated (11/17/2021)   Social Connection and Isolation Panel [NHANES]    Frequency of Communication with Friends and Family: More than three times a week    Frequency of Social Gatherings with Friends and Family: More than three times a week    Attends Religious Services: Never    Marine scientist or Organizations: No    Attends Archivist Meetings: Never    Marital  Status: Divorced    Outpatient Encounter Medications as of 11/21/2022  Medication Sig   atenolol (TENORMIN) 50 MG tablet Take 1 tablet (50 mg total) by mouth daily.   atorvastatin (LIPITOR) 20 MG tablet TAKE 1 TABLET BY MOUTH ONCE A DAY AT SIXIN THE EVENING   Cyanocobalamin (B-12) 1000 MCG SUBL Place 1 tablet under the tongue daily.   esomeprazole (NEXIUM) 40 MG capsule Take 1 capsule (40 mg total) by mouth daily.   furosemide (LASIX) 40 MG tablet TAKE 1 TABLET BY MOUTH ONCE A DAY AS NEEDED   lisinopril (ZESTRIL) 5 MG tablet Take 1 tablet (5 mg total) by mouth daily.   omega-3 acid ethyl esters (LOVAZA) 1 g capsule TAKE 2 CAPSULES BY MOUTH 2 TIMES DAILY   colchicine 0.6 MG tablet TAKE 1 TABLET BY MOUTH TWICE DAILY AS NEEDED. TAKE ONE AND MAY REPEAT A SECONDDOSE IN 2 HOURS, MAY STOP WHEN PAIN RESOLVES (Patient not taking: Reported on 08/01/2022)   No facility-administered encounter medications on file as of 11/21/2022.    Activities of Daily Living    11/21/2022    2:21 PM 11/21/2022    2:03 PM  In your present state of health, do you have any difficulty performing the following activities:  Hearing?  0  Vision?  1  Difficulty concentrating or making decisions?  0  Walking or climbing stairs?  1  Dressing or bathing?  0  Doing errands, shopping?  0  Preparing Food and eating ? N   Using the Toilet? N   In the past six months, have you accidently leaked urine? N   Do you have problems with loss of bowel control? N   Managing  your Medications? N   Managing your Finances? Y   Housekeeping or managing your Housekeeping? N     Patient Care Team: Steele Sizer, MD as PCP - General (Family Medicine)   Assessment:   This is a routine wellness examination for Joel Bryant.  Exercise Activities and Dietary recommendations Type of exercise: walking, Intensity: Mild, Exercise limited by: orthopedic condition(s);neurologic condition(s)   Goals Addressed   None     Fall Risk:    11/21/2022    2:03 PM 10/31/2022   10:06 AM 08/01/2022   10:59 AM 01/30/2022   10:05 AM 11/17/2021    2:22 PM  Fall Risk   Falls in the past year? 0 0 0 0 0  Number falls in past yr: 0 0 0 0 0  Injury with Fall? 0 0 0 0 0  Risk for fall due to : No Fall Risks Impaired vision No Fall Risks No Fall Risks Impaired vision  Follow up Falls prevention discussed;Education provided;Falls evaluation completed Falls prevention discussed Falls prevention discussed Falls prevention discussed Falls prevention discussed    FALL RISK PREVENTION PERTAINING TO THE HOME:  Any stairs in or around the home? Yes  If so, are there any without handrails? Yes   Home free of loose throw rugs in walkways, pet beds, electrical cords, etc? Yes  Adequate lighting in your home to reduce risk of falls? Yes   ASSISTIVE DEVICES UTILIZED TO PREVENT FALLS:  Life alert? No  Use of a cane, walker or w/c? Yes  Grab bars in the bathroom? Yes  Shower chair or bench in shower? No  Elevated toilet seat or a handicapped toilet? No   TIMED UP AND GO:  Was the test performed?  No video visit .  Length of time to ambulate 10 feet:  0 sec.     Depression Screen    11/21/2022    2:03 PM 10/31/2022   10:07 AM 08/01/2022   11:00 AM 01/30/2022   10:05 AM  PHQ 2/9 Scores  PHQ - 2 Score 0 0 0 0  PHQ- 9 Score 0 0 0 0    Cognitive Function        11/21/2022    2:27 PM  6CIT Screen  What Year? 0 points  What month? 0 points  What time? 0 points  Count back from 20 2  points  Months in reverse 0 points  Repeat phrase 0 points  Total Score 2 points    Immunization History  Administered Date(s) Administered   Influenza Split 08/01/2011   Influenza, Seasonal, Injecte, Preservative Fre 07/11/2012   Influenza,inj,Quad PF,6+ Mos 08/13/2013, 09/13/2015, 09/07/2016, 09/19/2018, 08/01/2021, 08/01/2022   Influenza-Unspecified 08/10/2017, 08/03/2020   Moderna Sars-Covid-2 Vaccination 02/18/2020, 03/17/2020, 09/14/2020, 02/08/2021   Pneumococcal Polysaccharide-23 09/15/2013   Tdap 09/11/2016   Zoster Recombinat (Shingrix) 07/08/2019, 12/05/2019    Qualifies for Shingles Vaccine? Completed  Tdap: Up to date  Flu Vaccine: Up to date  Pneumococcal Vaccine: Pt states completed does not remember the place he got it from.  Covid-19 Vaccine:  Completed vaccines  Screening Tests Health Maintenance  Topic Date Due   Lung Cancer Screening  Never done   Medicare Annual Wellness (AWV)  11/17/2022   COVID-19 Vaccine (5 - 2023-24 season) 12/07/2022 (Originally 06/30/2022)   OPHTHALMOLOGY EXAM  05/18/2023 (Originally 04/29/2018)   COLONOSCOPY (Pts 45-55yr Insurance coverage will need to be confirmed)  11/22/2023 (Originally 06/03/2021)   HEMOGLOBIN A1C  05/01/2023   FOOT EXAM  08/02/2023   Diabetic kidney evaluation - eGFR measurement  11/01/2023   Diabetic kidney evaluation - Urine ACR  11/01/2023   DTaP/Tdap/Td (2 - Td or Tdap) 09/11/2026   INFLUENZA VACCINE  Completed   Hepatitis C Screening  Completed   HIV Screening  Completed   Zoster Vaccines- Shingrix  Completed   HPV VACCINES  Aged Out   Cancer Screenings:  Colorectal Screening: Declined referral today   Lung Cancer Screening: (Low Dose CT Chest recommended if Age 63-80years, 30 pack-year currently smoking OR have quit w/in 15years.) does qualify. He declines referral for this today.  Lung Cancer Screening Referral: An Epic message has been sent to SBurgess Estelle RN (Oncology Nurse Navigator)  regarding the possible need for this exam. SRaquel Sarnawill review the patient's chart to determine if the patient truly qualifies for the exam. If the patient qualifies, SRaquel Sarnawill order the Low Dose CT of the chest to facilitate the scheduling of this exam.  Additional Screening:  Hepatitis C Screening:  Completed 09/03/2020  Vision Screening: Recommended annual ophthalmology exams for early detection of glaucoma and other disorders of the eye. Is the patient up to date with their annual eye exam?  No  Who is the provider or what is the name of the office in which the pt attends annual eye exams? Declined   Dental Screening: Recommended annual dental exams for proper oral hygiene  Community Resource Referral:  CRR required this visit?  No        Plan:  I have personally reviewed and addressed the Medicare Annual Wellness questionnaire and have noted the following in the patient's chart:  A. Medical and social history B. Use of alcohol, tobacco or illicit drugs  C. Current medications and supplements D. Functional ability and status E.  Nutritional status F.  Physical activity G. Advance directives H. List of other physicians I.  Hospitalizations, surgeries, and ER visits in previous 12 months J.  [place] such as hearing and vision if needed, cognitive and depression L. Referrals and appointments   In addition, I have reviewed and discussed with patient certain preventive protocols, quality metrics, and best practice recommendations. A written personalized care plan for preventive services as well as general preventive health recommendations were provided to patient.   Signed,  Talitha Givens, MHS, PA-C [facility]

## 2022-11-21 NOTE — Patient Instructions (Addendum)
[name] ,  Thank you for taking time to come for your Medicare Wellness Visit. I appreciate your ongoing commitment to your health goals. Please review the following plan we discussed and let me know if I can assist you in the future.   This is a list of the screenings recommended for you and due dates:   Colonoscopy: Your GI provider recommended that you repeat this in 2023. Please let us know if you would like to set up an apt to have this done so we can make the referral.  Lung Cancer: you meet the recommendations for a lung cancer screening. Please let us know if you would like a referral for this so you can get screened    Advanced directives: Not completed or on file. Please review the included information on Advanced directives and keep our office updated with a copy  Conditions/risks identified: None  Next appointment: Follow up in one year for your annual wellness visit   Preventive Care 40-64 Years, Male Preventive care refers to lifestyle choices and visits with your health care provider that can promote health and wellness. What does preventive care include? A yearly physical exam. This is also called an annual well check. Dental exams once or twice a year. Routine eye exams. Ask your health care provider how often you should have your eyes checked. Personal lifestyle choices, including: Daily care of your teeth and gums. Regular physical activity. Eating a healthy diet. Avoiding tobacco and drug use. Limiting alcohol use. Practicing safe sex. Taking low-dose aspirin every day starting at age 11. What happens during an annual well check? The services and screenings done by your health care provider during your annual well check will depend on your age, overall health, lifestyle risk factors, and family history of disease. Counseling  Your health care provider may ask you questions about your: Alcohol use. Tobacco use. Drug use. Emotional well-being. Home and  relationship well-being. Sexual activity. Eating habits. Work and work Statistician. Screening  You may have the following tests or measurements: Height, weight, and BMI. Blood pressure. Lipid and cholesterol levels. These may be checked every 5 years, or more frequently if you are over 18 years old. Skin check. Lung cancer screening. You may have this screening every year starting at age 70 if you have a 30-pack-year history of smoking and currently smoke or have quit within the past 15 years. Fecal occult blood test (FOBT) of the stool. You may have this test every year starting at age 43. Flexible sigmoidoscopy or colonoscopy. You may have a sigmoidoscopy every 5 years or a colonoscopy every 10 years starting at age 34. Prostate cancer screening. Recommendations will vary depending on your family history and other risks. Hepatitis C blood test. Hepatitis B blood test. Sexually transmitted disease (STD) testing. Diabetes screening. This is done by checking your blood sugar (glucose) after you have not eaten for a while (fasting). You may have this done every 1-3 years. Discuss your test results, treatment options, and if necessary, the need for more tests with your health care provider. Vaccines  Your health care provider may recommend certain vaccines, such as: Influenza vaccine. This is recommended every year. Tetanus, diphtheria, and acellular pertussis (Tdap, Td) vaccine. You may need a Td booster every 10 years. Zoster vaccine. You may need this after age 48. Pneumococcal 13-valent conjugate (PCV13) vaccine. You may need this if you have certain conditions and have not been vaccinated. Pneumococcal polysaccharide (PPSV23) vaccine. You may need one  or two doses if you smoke cigarettes or if you have certain conditions. Talk to your health care provider about which screenings and vaccines you need and how often you need them. This information is not intended to replace advice given to  you by your health care provider. Make sure you discuss any questions you have with your health care provider. Document Released: 11/12/2015 Document Revised: 07/05/2016 Document Reviewed: 08/17/2015 Elsevier Interactive Patient Education  2017 [place] Prevention in the Home Falls can cause injuries. They can happen to people of all ages. There are many things you can do to make your home safe and to help prevent falls. What can I do on the outside of my home? Regularly fix the edges of walkways and driveways and fix any cracks. Remove anything that might make you trip as you walk through a door, such as a raised step or threshold. Trim any bushes or trees on the path to your home. Use bright outdoor lighting. Clear any walking paths of anything that might make someone trip, such as rocks or tools. Regularly check to see if handrails are loose or broken. Make sure that both sides of any steps have handrails. Any raised decks and porches should have guardrails on the edges. Have any leaves, snow, or ice cleared regularly. Use sand or salt on walking paths during winter. Clean up any spills in your garage right away. This includes oil or grease spills. What can I do in the bathroom? Use night lights. Install grab bars by the toilet and in the tub and shower. Do not use towel bars as grab bars. Use non-skid mats or decals in the tub or shower. If you need to sit down in the shower, use a plastic, non-slip stool. Keep the floor dry. Clean up any water that spills on the floor as soon as it happens. Remove soap buildup in the tub or shower regularly. Attach bath mats securely with double-sided non-slip rug tape. Do not have throw rugs and other things on the floor that can make you trip. What can I do in the bedroom? Use night lights. Make sure that you have a light by your bed that is easy to reach. Do not use any sheets or blankets that are too big for your bed. They should not  hang down onto the floor. Have a firm chair that has side arms. You can use this for support while you get dressed. Do not have throw rugs and other things on the floor that can make you trip. What can I do in the kitchen? Clean up any spills right away. Avoid walking on wet floors. Keep items that you use a lot in easy-to-reach places. If you need to reach something above you, use a strong step stool that has a grab bar. Keep electrical cords out of the way. Do not use floor polish or wax that makes floors slippery. If you must use wax, use non-skid floor wax. Do not have throw rugs and other things on the floor that can make you trip. What can I do with my stairs? Do not leave any items on the stairs. Make sure that there are handrails on both sides of the stairs and use them. Fix handrails that are broken or loose. Make sure that handrails are as long as the stairways. Check any carpeting to make sure that it is firmly attached to the stairs. Fix any carpet that is loose or worn. Avoid having throw rugs  at the top or bottom of the stairs. If you do have throw rugs, attach them to the floor with carpet tape. Make sure that you have a light switch at the top of the stairs and the bottom of the stairs. If you do not have them, ask someone to add them for you. What else can I do to help prevent falls? Wear shoes that: Do not have high heels. Have rubber bottoms. Are comfortable and fit you well. Are closed at the toe. Do not wear sandals. If you use a stepladder: Make sure that it is fully opened. Do not climb a closed stepladder. Make sure that both sides of the stepladder are locked into place. Ask someone to hold it for you, if possible. Clearly mark and make sure that you can see: Any grab bars or handrails. First and last steps. Where the edge of each step is. Use tools that help you move around (mobility aids) if they are needed. These  include: Canes. Walkers. Scooters. Crutches. Turn on the lights when you go into a dark area. Replace any light bulbs as soon as they burn out. Set up your furniture so you have a clear path. Avoid moving your furniture around. If any of your floors are uneven, fix them. If there are any pets around you, be aware of where they are. Review your medicines with your doctor. Some medicines can make you feel dizzy. This can increase your chance of falling. Ask your doctor what other things that you can do to help prevent falls. This information is not intended to replace advice given to you by your health care provider. Make sure you discuss any questions you have with your health care provider. Document Released: 08/12/2009 Document Revised: 03/23/2016 Document Reviewed: 11/20/2014 Elsevier Interactive Patient Education  2017 [employer].

## 2022-12-04 ENCOUNTER — Other Ambulatory Visit: Payer: Self-pay | Admitting: Family Medicine

## 2022-12-04 DIAGNOSIS — R600 Localized edema: Secondary | ICD-10-CM

## 2022-12-18 ENCOUNTER — Telehealth: Payer: Self-pay | Admitting: Family Medicine

## 2022-12-18 NOTE — Telephone Encounter (Signed)
Spoke with patient and he stated he'd like [place] to call and cancel his appointment. He isn't able to read/write due to his eyesight. He stated he does not want to reschedule anything currently. Called his Nephrologist and left a message canceling for him. I left his number for callback if they needed to speak to him.

## 2022-12-18 NOTE — Telephone Encounter (Unsigned)
Copied from [place] [phone]. Topic: General - Other >> Dec 18, 2022 11:06 AM [place] B wrote: Reason for CRM: Patient wants to cancel appt for referral to kidney specialist, says he can see to call to cancel it.

## 2023-01-31 ENCOUNTER — Other Ambulatory Visit: Payer: Self-pay | Admitting: Family Medicine

## 2023-01-31 DIAGNOSIS — K219 Gastro-esophageal reflux disease without esophagitis: Secondary | ICD-10-CM

## 2023-01-31 DIAGNOSIS — I700 Atherosclerosis of aorta: Secondary | ICD-10-CM

## 2023-01-31 DIAGNOSIS — E785 Hyperlipidemia, unspecified: Secondary | ICD-10-CM

## 2023-01-31 DIAGNOSIS — I10 Essential (primary) hypertension: Secondary | ICD-10-CM

## 2023-01-31 DIAGNOSIS — E781 Pure hyperglyceridemia: Secondary | ICD-10-CM

## 2023-03-01 ENCOUNTER — Ambulatory Visit: Payer: 59 | Admitting: Family Medicine

## 2023-03-02 ENCOUNTER — Other Ambulatory Visit: Payer: Self-pay | Admitting: Family Medicine

## 2023-03-02 DIAGNOSIS — R600 Localized edema: Secondary | ICD-10-CM

## 2023-03-05 ENCOUNTER — Ambulatory Visit: Payer: Self-pay

## 2023-03-05 NOTE — Telephone Encounter (Signed)
   Chief Complaint: Dizziness, worse in the morning. Has shaking and fatigue. Symptoms: Above Frequency: "Along time." Pertinent Negatives: Patient denies  Disposition: [] ED /[] Urgent Care (no appt availability in office) / [x] Appointment(In office/virtual)/ []  [facility]/ [] Home Care/ [] Refused Recommended Disposition /[] [facility]/ []  Follow-up with PCP Additional Notes: Requested Friday appointment due to transportation.   Reason for Disposition  [1] MODERATE dizziness (e.g., interferes with normal activities) AND [2] has been evaluated by doctor (or NP/PA) for this  Answer Assessment - Initial Assessment Questions 1. DESCRIPTION: "Describe your dizziness."     Dizzy 2. LIGHTHEADED: "Do you feel lightheaded?" (e.g., somewhat faint, woozy, weak upon standing)     Woozy 3. VERTIGO: "Do you feel like either you or the room is spinning or tilting?" (i.e. vertigo)     No 4. SEVERITY: "How bad is it?"  "Do you feel like you are going to faint?" "Can you stand and walk?"   - MILD: Feels slightly dizzy, but walking normally.   - MODERATE: Feels unsteady when walking, but not falling; interferes with normal activities (e.g., school, work).   - SEVERE: Unable to walk without falling, or requires assistance to walk without falling; feels like passing out now.      Moderate 5. ONSET:  "When did the dizziness begin?"     For awhile 6. AGGRAVATING FACTORS: "Does anything make it worse?" (e.g., standing, change in head position)     Walking 7. HEART RATE: "Can you tell me your heart rate?" "How many beats in 15 seconds?"  (Note: not all patients can do this)       No 8. CAUSE: "What do you think is causing the dizziness?"     Unsure 9. RECURRENT SYMPTOM: "Have you had dizziness before?" If Yes, ask: "When was the last time?" "What happened that time?"     Yes 10. OTHER SYMPTOMS: "Do you have any other symptoms?" (e.g., fever, chest pain, vomiting, diarrhea, bleeding)        Shaking, fatigue 11. PREGNANCY: "Is there any chance you are pregnant?" "When was your last menstrual period?"       N/a  Protocols used: Dizziness - Lightheadedness-A-AH

## 2023-03-09 ENCOUNTER — Encounter: Payer: Self-pay | Admitting: Family Medicine

## 2023-03-09 ENCOUNTER — Ambulatory Visit (INDEPENDENT_AMBULATORY_CARE_PROVIDER_SITE_OTHER): Payer: 59 | Admitting: Family Medicine

## 2023-03-09 VITALS — BP 168/86 | HR 75 | Temp 98.3°F | Resp 16 | Ht 69.0 in | Wt 211.7 lb

## 2023-03-09 DIAGNOSIS — R42 Dizziness and giddiness: Secondary | ICD-10-CM | POA: Diagnosis not present

## 2023-03-09 DIAGNOSIS — S069X9A Unspecified intracranial injury with loss of consciousness of unspecified duration, initial encounter: Secondary | ICD-10-CM | POA: Diagnosis not present

## 2023-03-09 DIAGNOSIS — D696 Thrombocytopenia, unspecified: Secondary | ICD-10-CM

## 2023-03-09 DIAGNOSIS — I10 Essential (primary) hypertension: Secondary | ICD-10-CM | POA: Diagnosis not present

## 2023-03-09 DIAGNOSIS — Z9181 History of falling: Secondary | ICD-10-CM | POA: Diagnosis not present

## 2023-03-09 DIAGNOSIS — M5442 Lumbago with sciatica, left side: Secondary | ICD-10-CM

## 2023-03-09 DIAGNOSIS — R4182 Altered mental status, unspecified: Secondary | ICD-10-CM

## 2023-03-09 DIAGNOSIS — G8929 Other chronic pain: Secondary | ICD-10-CM

## 2023-03-09 DIAGNOSIS — M5441 Lumbago with sciatica, right side: Secondary | ICD-10-CM

## 2023-03-09 DIAGNOSIS — E1140 Type 2 diabetes mellitus with diabetic neuropathy, unspecified: Secondary | ICD-10-CM

## 2023-03-09 DIAGNOSIS — R251 Tremor, unspecified: Secondary | ICD-10-CM | POA: Diagnosis not present

## 2023-03-09 MED ORDER — MECLIZINE HCL 25 MG PO TABS: 12.5000 mg | ORAL_TABLET | Freq: Three times a day (TID) | ORAL | 0 refills | Status: DC | PRN

## 2023-03-09 NOTE — Progress Notes (Signed)
Patient ID: Joel Bryant, male    DOB: 1961/10/22, 62 y.o.   MRN: [id number]  PCP: Alba Cory, MD  Chief Complaint  Patient presents with   Dizziness    Dizzy, vertigo is flaring up   Fall    April 5th pt had a fall, he was shaking, dizzy and pt mom believe he may had a concussion unsure if he did since pt refused to go to the ER.  Pt was falling several times the next few days after that fall.    Subjective:   Joel Bryant is a 62 y.o. male, presents to clinic with CC of the following:  HPI  Pt presents for dizziness, tremors, falls  Dizziness is described as room spinning usually happens when he gets out of bed or sits up, lasts a few seconds, also happens if he bends over and sits back up quickly the episodes will also happen On meds but getting worse - using otc med - meclizine can help 2 hours after it happens  Pt on 02/02/23 pt had a unwitnessed fall and head injury, mother saw him in the morning, he did not answer multiple calls later that day, (they usually speak many times a day) she later that day when to his home to check on him, found him in the hallway at his home.  He had fallen, hit his head on the concrete outside, was trying to get inside but got stuck in the hallway, there blood from his porch through his house hallway,  he was disoriented didn't know where he was, could not get himself up, laceration to forehead and scalp left side, he did not let his mom take him to the hospital She got him to a recliner in his house and he felt ok so she later left and went home. He fell later that day when he got up to go to the bathroom and then again several times, that Saturday and Sunday he had two more falls where he needed assistance to get him up He was never evaluated for those falls/head injury His mother reports he was mostly feeling fell and back to normal activity that Monday. But they also are here for new tremor, sleepiness, "swimmy headed" since that  weekend.  He has prior hx of vertigo - sx are same as in the past but more frequent - he and his mother state he went to ENT and neuro about 2 years ago , they didn't find anything  Hx of some blindness and chronic pain affecting gait and mobility.  Pt in office appears unable to ambulate around office w/o assistance.  At home his mom had gotten him a walker and this has helped him, he uses it to get up and grabs onto it to prevent falls when he has vertigo sx.  BP is elevated today.  Last labs show low platelets.  Pt refuses to do stat scans or labs here today   Patient Active Problem List   Diagnosis Date Noted   GAD (generalized anxiety disorder) 11/20/2020   History of drug abuse in remission (HCC) 04/30/2020   Closed compression fracture of thoracic vertebra (HCC) 06/13/2018   Fatty liver 06/13/2018   Aortic atherosclerosis (HCC) 06/13/2018   Chronic pain syndrome 09/06/2016   Dyslipidemia associated with type 2 diabetes mellitus (HCC) 09/27/2015   H/O adenomatous polyp of colon 09/27/2015   Dysmetabolic syndrome 09/27/2015   Post Laminectomy Syndrome (L4-5 Hemilaminectomy and partial Facetectomy) 09/27/2015  Lumbar spondylosis 09/27/2015   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   Epidural fibrosis 09/27/2015   Chronic low back pain (Location of Primary Source of Pain) (Bilateral) (L>R) 09/27/2015   Chronic left knee pain 09/27/2015   Lumbar spinal stenosis (L4-5) 09/27/2015   Lumbar foraminal stenosis (Bilateral L3-4 and L4-5) 09/27/2015   Lumbar facet hypertrophy (Bilateral, Multilevel) 09/27/2015   Lumbar facet syndrome (Bilateral) 09/27/2015   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) 09/27/2015   Chronic hip pain (Location of Secondary source of pain) (Bilateral) (L>R) (secondary to osteonecrosis) 09/27/2015   Edema leg 09/13/2015   Gout 09/13/2015   Obstructive apnea 09/13/2015   Anxiety disorder 06/11/2015   Hypertriglyceridemia 06/11/2015    Dyslipidemia 06/11/2015   Controlled type 2 diabetes mellitus with diabetic neuropathy, without long-term current use of insulin (HCC) 06/11/2015   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   Retinitis pigmentosa 06/11/2015      Current Outpatient Medications:    atenolol (TENORMIN) 50 MG tablet, TAKE ONE TABLET BY MOUTH DAILY, Disp: 90 tablet, Rfl: 1   atorvastatin (LIPITOR) 20 MG tablet, TAKE ONE TABLET BY MOUTH ONCE A DAY AT SIX IN THE EVENING, Disp: 90 tablet, Rfl: 1   colchicine 0.6 MG tablet, TAKE 1 TABLET BY MOUTH TWICE DAILY AS NEEDED. TAKE ONE AND MAY REPEAT A SECONDDOSE IN 2 HOURS, MAY STOP WHEN PAIN RESOLVES, Disp: 30 tablet, Rfl: 1   Cyanocobalamin (B-12) 1000 MCG SUBL, Place 1 tablet under the tongue daily., Disp: 100 tablet, Rfl: 1   esomeprazole (NEXIUM) 40 MG capsule, TAKE ONE CAPSULE BY MOUTH DAILY, Disp: 90 capsule, Rfl: 1   furosemide (LASIX) 40 MG tablet, TAKE ONE TABLET BY MOUTH ONCE A DAY AS NEEDED, Disp: 90 tablet, Rfl: 0   lisinopril (ZESTRIL) 5 MG tablet, TAKE ONE TABLET BY MOUTH DAILY, Disp: 90 tablet, Rfl: 1   omega-3 acid ethyl esters (LOVAZA) 1 g capsule, TAKE TWO CAPSULES BY MOUTH TWO TIMES DAILY, Disp: 360 capsule, Rfl: 1   No Known Allergies   Social History   Tobacco Use   Smoking status: Some Days    Packs/day: 0.75    Years: 35.00    Additional pack years: 0.00    Total pack years: 26.25    Types: Cigarettes   Smokeless tobacco: Never   Tobacco comments:    Quit smoking for 9 years, started again Feb 2022    Started at age 61 and smokes for a total of 30 plus years   Vaping Use   Vaping Use: Never used  Substance Use Topics   Alcohol use: Yes    Comment: 15 beers per day   Drug use: Not Currently    Types: Marijuana, "Crack" cocaine, Cocaine, Other-see comments    Comment: Cocaine use previously; marijuana currently occasionally      Chart Review Today: I personally reviewed active problem list, medication list, allergies, family  history, social history, health maintenance, notes from last encounter, lab results, imaging with the patient/caregiver today.   Review of Systems  Constitutional:  Positive for fatigue.  HENT: Negative.    Eyes: Negative.   Respiratory: Negative.    Cardiovascular: Negative.   Gastrointestinal: Negative.   Endocrine: Negative.   Genitourinary: Negative.   Musculoskeletal: Negative.   Skin: Negative.   Allergic/Immunologic: Negative.   Neurological:  Positive for dizziness and tremors. Negative for seizures, weakness, light-headedness, numbness and headaches.  Hematological: Negative.   Psychiatric/Behavioral:  Positive for confusion (foggy) and sleep disturbance.   All  other systems reviewed and are negative.      Objective:   Vitals:   03/09/23 1336 03/09/23 1421  BP: (!) 170/92 (!) 168/86  Pulse: 75   Resp: 16   Temp: 98.3 F (36.8 C)   TempSrc: Oral   SpO2: 94%   Weight: 211 lb 11.2 oz (96 kg)   Height: 5\' 9"  (1.753 m)     Body mass index is 31.26 kg/m.  Physical Exam Vitals and nursing note reviewed.  Constitutional:      General: He is not in acute distress.    Appearance: He is well-developed and well-groomed. He is obese. He is not toxic-appearing or diaphoretic.  HENT:     Head: Normocephalic and atraumatic. No raccoon eyes, abrasion, contusion, right periorbital erythema or left periorbital erythema.     Jaw: There is normal jaw occlusion.     Comments: Healed scar to left forehead and scalp    Nose: Nose normal.  Eyes:     General: Lids are normal.        Right eye: No discharge.        Left eye: No discharge.     Conjunctiva/sclera: Conjunctivae normal.  Neck:     Trachea: No tracheal deviation.  Cardiovascular:     Rate and Rhythm: Normal rate and regular rhythm.  Pulmonary:     Effort: Pulmonary effort is normal. No respiratory distress.     Breath sounds: No stridor.  Musculoskeletal:        General: Normal range of motion.  Skin:     General: Skin is warm and dry.     Coloration: Skin is not jaundiced or pale.     Findings: No bruising, lesion or rash.  Neurological:     Mental Status: He is alert.     Cranial Nerves: No facial asymmetry.     Motor: Tremor present. No weakness.     Coordination: Coordination abnormal.     Gait: Gait abnormal.     Comments: HOH and legally blind  Psychiatric:        Behavior: Behavior normal.      Results for orders placed or performed in visit on 08/01/22  Lipid panel  Result Value Ref Range   Cholesterol 191 <200 mg/dL   HDL 67 > OR = 40 mg/dL   Triglycerides 161 (H) <150 mg/dL   LDL Cholesterol (Calc) 92 mg/dL (calc)   Total CHOL/HDL Ratio 2.9 <5.0 (calc)   Non-HDL Cholesterol (Calc) 124 <130 mg/dL (calc)  CBC with Differential/Platelet  Result Value Ref Range   WBC 7.6 3.8 - 10.8 Thousand/uL   RBC 4.55 4.20 - 5.80 Million/uL   Hemoglobin 14.7 13.2 - 17.1 g/dL   HCT 09.6 04.5 - 40.9 %   MCV 91.6 80.0 - 100.0 fL   MCH 32.3 27.0 - 33.0 pg   MCHC 35.3 32.0 - 36.0 g/dL   RDW 81.1 91.4 - 78.2 %   Platelets 95 (L) 140 - 400 Thousand/uL   MPV 11.0 7.5 - 12.5 fL   Neutro Abs 4,963 1,500 - 7,800 cells/uL   Lymphs Abs 1,778 850 - 3,900 cells/uL   Absolute Monocytes 752 200 - 950 cells/uL   Eosinophils Absolute 68 15 - 500 cells/uL   Basophils Absolute 38 0 - 200 cells/uL   Neutrophils Relative % 65.3 %   Total Lymphocyte 23.4 %   Monocytes Relative 9.9 %   Eosinophils Relative 0.9 %   Basophils Relative 0.5 %  COMPLETE METABOLIC  PANEL WITH GFR  Result Value Ref Range   Glucose, Bld 108 (H) 65 - 99 mg/dL   BUN 12 7 - 25 mg/dL   Creat 1.61 (L) 0.96 - 1.35 mg/dL   eGFR 045 > OR = 60 WU/JWJ/1.91Y7   BUN/Creatinine Ratio 18 6 - 22 (calc)   Sodium 133 (L) 135 - 146 mmol/L   Potassium 3.9 3.5 - 5.3 mmol/L   Chloride 95 (L) 98 - 110 mmol/L   CO2 25 20 - 32 mmol/L   Calcium 9.4 8.6 - 10.3 mg/dL   Total Protein 8.1 6.1 - 8.1 g/dL   Albumin 5.0 3.6 - 5.1 g/dL   Globulin  3.1 1.9 - 3.7 g/dL (calc)   AG Ratio 1.6 1.0 - 2.5 (calc)   Total Bilirubin 1.1 0.2 - 1.2 mg/dL   Alkaline phosphatase (APISO) 106 35 - 144 U/L   AST 105 (H) 10 - 35 U/L   ALT 35 9 - 46 U/L  Vitamin B12  Result Value Ref Range   Vitamin B-12 773 200 - 1,100 pg/mL  Hemoglobin A1c  Result Value Ref Range   Hgb A1c MFr Bld 5.5 <5.7 % of total Hgb   Mean Plasma Glucose 111 mg/dL   eAG (mmol/L) 6.2 mmol/L  HIV Antibody (routine testing w rflx)  Result Value Ref Range   HIV 1&2 Ab, 4th Generation NON-REACTIVE NON-REACTIVE  TSH  Result Value Ref Range   TSH 0.66 0.40 - 4.50 mIU/L  Sedimentation rate  Result Value Ref Range   Sed Rate 17 0 - 20 mm/h  C-reactive protein  Result Value Ref Range   CRP 1.3 <8.0 mg/L  Microalbumin / creatinine urine ratio  Result Value Ref Range   Creatinine, Urine 38 20 - 320 mg/dL   Microalb, Ur 82.9 mg/dL   Microalb Creat Ratio 429 (H) <30 mcg/mg creat       Assessment & Plan:     Pt presents with multiple complaints with concerning history of recent fall/head injury and since many sx, however he refuses to go to the ED, do labs or do a stat scan today He and his mother understand the possible risks and understand they are chosing to do delayed work up [employer]  I am concerned that he needed eval a month ago at the time of his injury with his confusion.  He would still need CT with all of his sx and to r/o TBI before being able to eval sx as possible post concussion syndrome sx  - I explained the need for scan, pt still refused today. He at the last minute agreed to basic labs He can do PT for the vertigo CT pending - may need neuro if CT neg, and he will likely need neuro for tremor eval    ICD-10-CM   1. Vertigo  R42 CT HEAD WO CONTRAST ( )    Ambulatory referral to Physical Therapy   sounds consistent with BPPV, can try meclizine dose at bedtime, PT for vestibular PT/epley maneuver    2. Personal history of fall  Z91.81 CT HEAD WO CONTRAST  ( )    3. Tremor  R25.1 CT HEAD WO CONTRAST ( )    CBC with Differential/Platelet    COMPLETE METABOLIC PANEL WITH GFR    TSH   new since fall with head injury/laceration - labs, head CT    4. Head injury with loss of consciousness (HCC)  S06.9X9A CT HEAD WO CONTRAST ( )   concerning head injury, impact on  concrete, split scalp/forehead, was confused, multiple subsequent falls - needs imaging    5. Altered mental status, unspecified altered mental status type  R41.82 CT HEAD WO CONTRAST ( )   episode of fall, head injury acute confusion at that time, returned near to baseline in about 3 days (4/5 to 4/7)    6. Essential hypertension  I10    uncontrolled    7. Chronic low back pain (Location of Primary Source of Pain) (Bilateral) (L>R)  M54.42    M54.41    G89.29     8. Controlled type 2 diabetes mellitus with diabetic neuropathy, without long-term current use of insulin (HCC)  E11.40 COMPLETE METABOLIC PANEL WITH GFR    9. Thrombocytopenia (HCC)  D69.6 CBC with Differential/Platelet   higher risk for intercranial injury with past falls and neuro sx - recheck labs      Per chart med adherence - almost all of his medications for managing chronic conditions - they have not been noted as filled since January 2024 -  He may not be on or taking his meds  For uncontrolled HTN he will need to get meds, restart (refills available) and then fup in office for recheck - recommend seeing PCP as well with all these concerning events and sx     Danelle Berry, PA-C 03/09/23 1:52 PM

## 2023-03-09 NOTE — Patient Instructions (Addendum)
Once we have labs and head CT we will update you on plan.  Your vertigo should be evaluated and hopefully treated by PT - (epley maneuver)  You can try to take a meclizine at bedtime and see if that helps reduce your morning vertigo symptoms when you get out of bed  Your BP if high and uncontrolled today - follow up in 2 weeks with your PCP for recheck.  Call 911 or go to the ER if your balance worsens, you have confusion, falls -   Head Injury, Adult There are many types of head injuries. They can be as minor as a small bump. Some head injuries can be worse. Worse injuries include: A strong hit to the head that shakes the brain back and forth, causing damage (concussion). A bruise (contusion) of the brain. This means there is bleeding in the brain that can cause swelling. A cracked skull (skull fracture). Bleeding in the brain that gathers, gets thick (makes a clot), and forms a bump (hematoma). Most problems from a head injury come in the first 24 hours. However, you may still have side effects up to 7-10 days after your injury. It is important to watch your condition for any changes. You may need to be watched in the emergency department or urgent care, or you may need to stay in the hospital. What are the causes? There are many possible causes of a head injury. A serious head injury may be caused by: A car accident. Bicycle or motorcycle accidents. Sports injuries. Falls. Being hit by an object. What are the signs or symptoms? Symptoms of a head injury include a bruise, bump, or bleeding where the injury happened. Other physical symptoms may include: Headache. Feeling like you may vomit (nauseous) or vomiting. Dizziness. Blurred or double vision. Being uncomfortable around bright lights or loud noises. Shaking movements that you cannot control (seizures). Feeling tired. Trouble being woken up. Fainting or loss of consciousness. Mental or emotional symptoms may include: Feeling  grumpy or cranky. Confusion and memory problems. Having trouble paying attention or concentrating. Changes in eating or sleeping habits. Feeling worried or nervous (anxious). Feeling sad (depressed). How is this treated? Treatment for this condition depends on how severe the injury is and the type of injury you have. The main goal is to prevent problems and to allow the brain time to heal. Mild head injury If you have a mild head injury, you may be sent home, and treatment may include: Being watched. A responsible adult should stay with you for 24 hours after your injury and check on you often. Physical rest. Brain rest. Pain medicines. Severe head injury If you have a severe head injury, treatment may include: Being watched closely. This includes staying in the hospital. Medicines to: Help with pain. Prevent seizures. Help with brain swelling. Protecting your airway and using a machine that helps you breathe (ventilator). Treatments to watch for and manage swelling inside the brain. Brain surgery. This may be needed to: Remove a collection of blood or blood clots. Stop the bleeding. Remove a part of the skull. This allows room for the brain to swell. Follow these instructions at home: Activity Rest. Avoid activities that are hard or tiring. Make sure you get enough sleep. Let your brain rest. Do this by limiting activities that need a lot of thought or attention, such as: Watching TV. Playing memory games and puzzles. Job-related work or homework. Working on [employer], and texting. Avoid activities that could  cause another head injury until your doctor says it is okay. This includes playing sports. Having another head injury, especially before the first one has healed, can be dangerous. Ask your doctor when it is safe for you to go back to your normal activities, such as work or school. Ask your doctor for a step-by-step plan for slowly going back to your  normal activities. Ask your doctor when you can drive, ride a bicycle, or use heavy machinery. Do not do these activities if you are dizzy. Lifestyle  Do not drink alcohol until your doctor says it is okay. Do not use drugs. If it is harder than usual to remember things, write them down. If you are easily distracted, try to do one thing at a time. Talk with family members or close friends when making important decisions. Tell your friends, family, a trusted co-worker, and work Production designer, theatre/television/film about your injury, symptoms, and limits (restrictions). Have them watch for any problems that are new or getting worse. General instructions Take over-the-counter and prescription medicines only as told by your doctor. Have someone stay with you for 24 hours after your head injury. This person should watch you for any changes in your symptoms and be ready to get help. Keep all follow-up visits as told by your doctor. This is important. How is this prevented? Work on Therapist, music. This can help you avoid falls. Wear a seat belt when you are in a moving vehicle. Wear a helmet when you: Ride a bicycle. Ski. Do any other sport or activity that has a risk of injury. If you drink alcohol: Limit how much you use to: 0-1 drink a day for nonpregnant women. 0-2 drinks a day for men. Be aware of how much alcohol is in your drink. In the [place] one drink equals one 12 oz bottle of beer (355 mL), one 5 oz glass of wine (148 mL), or one 1 oz glass of hard liquor (44 mL). Make your home safer by: Getting rid of clutter from the floors and stairs. This includes things that can make you trip. Using grab bars in bathrooms and handrails by stairs. Placing non-slip mats on floors and in bathtubs. Putting more light in dim areas. Where to find more information Centers for Disease Control and Prevention: [employer] Get help right away if: You have: A very bad headache that is not helped by medicine. Trouble  walking or weakness in your arms and legs. Clear or bloody fluid coming from your nose or ears. Changes in how you see (vision). A seizure. More confusion or more grumpy moods. Your symptoms get worse. You are sleepier than normal and have trouble staying awake. You lose your balance. The black centers of your eyes (pupils) change in size. Your speech is slurred. Your dizziness gets worse. You vomit. These symptoms may be an emergency. Do not wait to see if the symptoms will go away. Get medical help right away. Call your local emergency services (911 in the [place] Do not drive yourself to the hospital. Summary Head injuries can be as minor as a small bump. Some head injuries can be worse. Treatment for this condition depends on how severe the injury is and the type of injury you have. Have someone stay with you for 24 hours after your head injury. Ask your doctor when it is safe for you to go back to your normal activities, such as work or school. To prevent a head injury, wear a seat belt in  a car, wear a helmet when you use a bicycle, limit your alcohol use, and make your home safer. This information is not intended to replace advice given to you by your health care provider. Make sure you discuss any questions you have with your health care provider. Document Revised: 08/29/2019 Document Reviewed: 08/29/2019 Elsevier Patient Education  2023 Elsevier Inc.  How to Perform the Epley Maneuver The Epley maneuver is an exercise that relieves symptoms of vertigo. Vertigo is the feeling that you or your surroundings are moving when they are not. When you feel vertigo, you may feel like the room is spinning and may have trouble walking. The Epley maneuver is used for a type of vertigo caused by a calcium deposit in a part of the inner ear. The maneuver involves changing head positions to help the deposit move out of the area. You can do this maneuver at home whenever you have symptoms of  vertigo. You can repeat it in 24 hours if your vertigo has not gone away. Even though the Epley maneuver may relieve your vertigo for a few weeks, it is possible that your symptoms will return. This maneuver relieves vertigo, but it does not relieve dizziness. What are the risks? If it is done correctly, the Epley maneuver is considered safe. Sometimes it can lead to dizziness or nausea that goes away after a short time. If you develop other symptoms--such as changes in vision, weakness, or numbness--stop doing the maneuver and call your health care provider. Supplies needed: A bed or table. A pillow. How to do the Epley maneuver     Sit on the edge of a bed or table with your back straight and your legs extended or hanging over the edge of the bed or table. Turn your head halfway toward the affected ear or side as told by your health care provider. Lie backward quickly with your head turned until you are lying flat on your back. Your head should dangle (head-hanging position). You may want to position a pillow under your shoulders. Hold this position for at least 30 seconds. If you feel dizzy or have symptoms of vertigo, continue to hold the position until the symptoms stop. Turn your head to the opposite direction until your unaffected ear is facing down. Your head should continue to dangle. Hold this position for at least 30 seconds. If you feel dizzy or have symptoms of vertigo, continue to hold the position until the symptoms stop. Turn your whole body to the same side as your head so that you are positioned on your side. Your head will now be nearly facedown and no longer needs to dangle. Hold for at least 30 seconds. If you feel dizzy or have symptoms of vertigo, continue to hold the position until the symptoms stop. Sit back up. You can repeat the maneuver in 24 hours if your vertigo does not go away. Follow these instructions at home: For 24 hours after doing the Epley maneuver: Keep  your head in an upright position. When lying down to sleep or rest, keep your head raised (elevated) with two or more pillows. Avoid excessive neck movements. Activity Do not drive or use machinery if you feel dizzy. After doing the Epley maneuver, return to your normal activities as told by your health care provider. Ask your health care provider what activities are safe for you. General instructions Drink enough fluid to keep your urine pale yellow. Do not drink alcohol. Take over-the-counter and prescription medicines only as  told by your health care provider. Keep all follow-up visits. This is important. Preventing vertigo symptoms Ask your health care provider if there is anything you should do at home to prevent vertigo. He or she may recommend that you: Keep your head elevated with two or more pillows while you sleep. Do not sleep on the side of your affected ear. Get up slowly from bed. Avoid sudden movements during the day. Avoid extreme head positions or movement, such as looking up or bending over. Contact a health care provider if: Your vertigo gets worse. You have other symptoms, including: Nausea. Vomiting. Headache. Get help right away if you: Have vision changes. Have a headache or neck pain that is severe or getting worse. Cannot stop vomiting. Have new numbness or weakness in any part of your body. These symptoms may represent a serious problem that is an emergency. Do not wait to see if the symptoms will go away. Get medical help right away. Call your local emergency services (911 in the [place] Do not drive yourself to the hospital. Summary Vertigo is the feeling that you or your surroundings are moving when they are not. The Epley maneuver is an exercise that relieves symptoms of vertigo. If the Epley maneuver is done correctly, it is considered safe. This information is not intended to replace advice given to you by your health care provider. Make sure you  discuss any questions you have with your health care provider. Document Revised: 09/15/2020 Document Reviewed: 09/15/2020 Elsevier Patient Education  2023 [employer].

## 2023-03-10 LAB — COMPLETE METABOLIC PANEL WITH GFR
AG Ratio: 1.3 (calc) (ref 1.0–2.5)
ALT: 35 U/L (ref 9–46)
AST: 164 U/L — ABNORMAL HIGH (ref 10–35)
Albumin: 4 g/dL (ref 3.6–5.1)
Alkaline phosphatase (APISO): 134 U/L (ref 35–144)
BUN/Creatinine Ratio: 19 (calc) (ref 6–22)
BUN: 11 mg/dL (ref 7–25)
CO2: 24 mmol/L (ref 20–32)
Calcium: 9 mg/dL (ref 8.6–10.3)
Chloride: 96 mmol/L — ABNORMAL LOW (ref 98–110)
Creat: 0.58 mg/dL — ABNORMAL LOW (ref 0.70–1.35)
Globulin: 3.2 g/dL (calc) (ref 1.9–3.7)
Glucose, Bld: 107 mg/dL — ABNORMAL HIGH (ref 65–99)
Potassium: 3.7 mmol/L (ref 3.5–5.3)
Sodium: 133 mmol/L — ABNORMAL LOW (ref 135–146)
Total Bilirubin: 2.3 mg/dL — ABNORMAL HIGH (ref 0.2–1.2)
Total Protein: 7.2 g/dL (ref 6.1–8.1)
eGFR: 110 mL/min/{1.73_m2} (ref >=60)

## 2023-03-10 LAB — CBC WITH DIFFERENTIAL/PLATELET
Absolute Monocytes: 724 cells/uL (ref 200–950)
Basophils Absolute: 20 cells/uL (ref 0–200)
Basophils Relative: 0.4 %
Eosinophils Absolute: 41 cells/uL (ref 15–500)
Eosinophils Relative: 0.8 %
HCT: 35.5 % — ABNORMAL LOW (ref 38.5–50.0)
Hemoglobin: 12.5 g/dL — ABNORMAL LOW (ref 13.2–17.1)
Lymphs Abs: 704 cells/uL — ABNORMAL LOW (ref 850–3900)
MCH: 33.2 pg — ABNORMAL HIGH (ref 27.0–33.0)
MCHC: 35.2 g/dL (ref 32.0–36.0)
MCV: 94.4 fL (ref 80.0–100.0)
MPV: 12.5 fL (ref 7.5–12.5)
Monocytes Relative: 14.2 %
Neutro Abs: 3611 cells/uL (ref 1500–7800)
Neutrophils Relative %: 70.8 %
Platelets: 47 10*3/uL — ABNORMAL LOW (ref 140–400)
RBC: 3.76 10*6/uL — ABNORMAL LOW (ref 4.20–5.80)
RDW: 12.5 % (ref 11.0–15.0)
Total Lymphocyte: 13.8 %
WBC: 5.1 10*3/uL (ref 3.8–10.8)

## 2023-03-10 LAB — TSH: TSH: 1.1 mIU/L (ref 0.40–4.50)

## 2023-03-12 ENCOUNTER — Telehealth: Payer: Self-pay

## 2023-03-12 NOTE — Telephone Encounter (Signed)
Pt's mother called stating pt was advised to go to ED for evaluation today. Pt refusing to go stating he will go in the morning. Discussed result notes and potential risks with mother. Advised her will call PCP nurse to notify pt's refusal to go to ED. Called office and advised Maricella of pt refusal to go to ED. Maricella stated she will notify Dr. Carlynn Purl.

## 2023-03-13 ENCOUNTER — Other Ambulatory Visit: Payer: Self-pay

## 2023-03-13 ENCOUNTER — Emergency Department: Payer: 59

## 2023-03-13 ENCOUNTER — Encounter: Payer: Self-pay | Admitting: Emergency Medicine

## 2023-03-13 ENCOUNTER — Emergency Department
Admission: EM | Admit: 2023-03-13 | Discharge: 2023-03-13 | Disposition: A | Discharge status: Home / Self Care | Payer: 59 | Attending: Emergency Medicine | Admitting: Emergency Medicine

## 2023-03-13 DIAGNOSIS — W19XXXA Unspecified fall, initial encounter: Secondary | ICD-10-CM | POA: Insufficient documentation

## 2023-03-13 DIAGNOSIS — Y908 Blood alcohol level of 240 mg/100 ml or more: Secondary | ICD-10-CM | POA: Insufficient documentation

## 2023-03-13 DIAGNOSIS — F1020 Alcohol dependence, uncomplicated: Secondary | ICD-10-CM | POA: Diagnosis not present

## 2023-03-13 DIAGNOSIS — S0990XA Unspecified injury of head, initial encounter: Secondary | ICD-10-CM | POA: Insufficient documentation

## 2023-03-13 DIAGNOSIS — S199XXA Unspecified injury of neck, initial encounter: Secondary | ICD-10-CM | POA: Diagnosis not present

## 2023-03-13 DIAGNOSIS — R748 Abnormal levels of other serum enzymes: Secondary | ICD-10-CM | POA: Diagnosis not present

## 2023-03-13 DIAGNOSIS — R945 Abnormal results of liver function studies: Secondary | ICD-10-CM | POA: Diagnosis not present

## 2023-03-13 LAB — COMPREHENSIVE METABOLIC PANEL
ALT: 46 U/L — ABNORMAL HIGH (ref 0–44)
AST: 205 U/L — ABNORMAL HIGH (ref 15–41)
Albumin: 3.9 g/dL (ref 3.5–5.0)
Alkaline Phosphatase: 135 U/L — ABNORMAL HIGH (ref 38–126)
Anion gap: 11 (ref 5–15)
BUN: 9 mg/dL (ref 8–23)
CO2: 25 mmol/L (ref 22–32)
Calcium: 8.4 mg/dL — ABNORMAL LOW (ref 8.9–10.3)
Chloride: 93 mmol/L — ABNORMAL LOW (ref 98–111)
Creatinine, Ser: 0.49 mg/dL — ABNORMAL LOW (ref 0.61–1.24)
GFR, Estimated: >60 mL/min (ref >60)
Glucose, Bld: 128 mg/dL — ABNORMAL HIGH (ref 70–99)
Potassium: 3.2 mmol/L — ABNORMAL LOW (ref 3.5–5.1)
Sodium: 129 mmol/L — ABNORMAL LOW (ref 135–145)
Total Bilirubin: 2 mg/dL — ABNORMAL HIGH (ref 0.3–1.2)
Total Protein: 7.6 g/dL (ref 6.5–8.1)

## 2023-03-13 LAB — CBC
HCT: 36.3 % — ABNORMAL LOW (ref 39.0–52.0)
Hemoglobin: 12.5 g/dL — ABNORMAL LOW (ref 13.0–17.0)
MCH: 32.9 pg (ref 26.0–34.0)
MCHC: 34.4 g/dL (ref 30.0–36.0)
MCV: 95.5 fL (ref 80.0–100.0)
Platelets: 86 10*3/uL — ABNORMAL LOW (ref 150–400)
RBC: 3.8 MIL/uL — ABNORMAL LOW (ref 4.22–5.81)
RDW: 13 % (ref 11.5–15.5)
WBC: 6.3 10*3/uL (ref 4.0–10.5)
nRBC: 0 % (ref 0.0–0.2)

## 2023-03-13 LAB — ETHANOL: Alcohol, Ethyl (B): 345 mg/dL (ref <10)

## 2023-03-13 NOTE — ED Notes (Signed)
First nurse note: Pt assisted out of vehicle with WC per mother request. NAD noted.

## 2023-03-13 NOTE — ED Triage Notes (Signed)
Pt to ED with mother for abnormal labs. Reports liver enzymes elevated. Mother also reports pt needs head CT d/t pt falling multiple times recently. Pt reports recently busted head open and doesn't remember anything for 3 days.  Daily drinker, last drink 45 mins ago.  Denies complaints

## 2023-03-13 NOTE — Discharge Instructions (Addendum)
Your liver enzymes are slightly elevated, this is almost certainly related to alcohol use

## 2023-03-13 NOTE — ED Provider Notes (Signed)
[facility] Provider Note    Event Date/Time   First MD Initiated Contact with Patient 03/13/23 [phone]     (approximate)   History   abnormal labs and Fall   HPI  MONTELL WINGET is a 62 y.o. male with a history of alcoholism, who presents with reports of abnormal LFTs.  Additionally he has had multiple falls recently per his mother.  Patient describes a fall 6 weeks ago where he hit his head and that he does not remember the circumstances around it.  Otherwise he feels well and has no complaints at this time.  No abdominal pain.     Physical Exam   Triage Vital Signs: ED Triage Vitals  Enc Vitals Group     BP 03/13/23 0801 (!) 143/83     Pulse Rate 03/13/23 0801 64     Resp 03/13/23 0801 18     Temp 03/13/23 0801 98.6 F (37 C)     Temp src --      SpO2 03/13/23 0801 97 %     Weight 03/13/23 0803 93 kg (205 lb)     Height 03/13/23 0803 1.753 m (5\' 9" )     Head Circumference --      Peak Flow --      Pain Score 03/13/23 0803 0     Pain Loc --      Pain Edu? --      Excl. in GC? --     Most recent vital signs: Vitals:   03/13/23 0801  BP: (!) 143/83  Pulse: 64  Resp: 18  Temp: 98.6 F (37 C)  SpO2: 97%     General: Awake, no distress.  CV:  Good peripheral perfusion.  Resp:  Normal effort.  Abd:  No distention.  Other:     ED Results / Procedures / Treatments   Labs (all labs ordered are listed, but only abnormal results are displayed) Labs Reviewed  ETHANOL - Abnormal; Notable for the following components:      Result Value   Alcohol, Ethyl (B) 345 (*)    All other components within normal limits  COMPREHENSIVE METABOLIC PANEL - Abnormal; Notable for the following components:   Sodium 129 (*)    Potassium 3.2 (*)    Chloride 93 (*)    Glucose, Bld 128 (*)    Creatinine, Ser 0.49 (*)    Calcium 8.4 (*)    AST 205 (*)    ALT 46 (*)    Alkaline Phosphatase 135 (*)    Total Bilirubin 2.0 (*)    All other components  within normal limits  CBC - Abnormal; Notable for the following components:   RBC 3.80 (*)    Hemoglobin 12.5 (*)    HCT 36.3 (*)    Platelets 86 (*)    All other components within normal limits     EKG     RADIOLOGY  CT head and cervical spine without acute normalities   PROCEDURES:  Critical Care performed:   Procedures   MEDICATIONS ORDERED IN ED: Medications - No data to display   IMPRESSION / MDM / ASSESSMENT AND PLAN / ED COURSE  I reviewed the triage vital signs and the nursing notes. Patient's presentation is most consistent with acute illness / injury with system symptoms.  Patient here for evaluation of elevated LFTs.  Reviewed labs from 03/09/2023, mild elevation of AST and total bilirubin.  Patient has admitted to drinking heavily for 40  years, this is likely the cause of his elevated LFTs  Today his AST is 205 his ALT is 46, his total bili is 2.0.  He has no abdominal pain, discussed with him strong risk of cirrhosis which he is well aware of.          FINAL CLINICAL IMPRESSION(S) / ED DIAGNOSES   Final diagnoses:  Elevated liver enzymes  Alcoholism (HCC)     Rx / DC Orders   ED Discharge Orders     None        Note:  This document was prepared using Dragon voice recognition software and may include unintentional dictation errors.   Jene Every, MD 03/13/23 1101

## 2023-03-15 ENCOUNTER — Ambulatory Visit: Payer: 59

## 2023-03-19 NOTE — Progress Notes (Deleted)
Name: CORBYN TRITCH   MRN: [id number]    DOB: 11/30/1960   Date:03/19/2023       Progress Note  Subjective  Chief Complaint  Follow Up  HPI  *** Patient Active Problem List   Diagnosis Date Noted   GAD (generalized anxiety disorder) 11/20/2020   History of drug abuse in remission (HCC) 04/30/2020   Closed compression fracture of thoracic vertebra (HCC) 06/13/2018   Fatty liver 06/13/2018   Aortic atherosclerosis (HCC) 06/13/2018   Chronic pain syndrome 09/06/2016   Dyslipidemia associated with type 2 diabetes mellitus (HCC) 09/27/2015   H/O adenomatous polyp of colon 09/27/2015   Dysmetabolic syndrome 09/27/2015   Post Laminectomy Syndrome (L4-5 Hemilaminectomy and partial Facetectomy) 09/27/2015   Lumbar spondylosis 09/27/2015   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   Epidural fibrosis 09/27/2015   Chronic low back pain (Location of Primary Source of Pain) (Bilateral) (L>R) 09/27/2015   Chronic left knee pain 09/27/2015   Lumbar spinal stenosis (L4-5) 09/27/2015   Lumbar foraminal stenosis (Bilateral L3-4 and L4-5) 09/27/2015   Lumbar facet hypertrophy (Bilateral, Multilevel) 09/27/2015   Lumbar facet syndrome (Bilateral) 09/27/2015   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) 09/27/2015   Chronic hip pain (Location of Secondary source of pain) (Bilateral) (L>R) (secondary to osteonecrosis) 09/27/2015   Edema leg 09/13/2015   Gout 09/13/2015   Obstructive apnea 09/13/2015   Anxiety disorder 06/11/2015   Hypertriglyceridemia 06/11/2015   Dyslipidemia 06/11/2015   Controlled type 2 diabetes mellitus with diabetic neuropathy, without long-term current use of insulin (HCC) 06/11/2015   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   Retinitis pigmentosa 06/11/2015    Past Surgical History:  Procedure Laterality Date   BACK SURGERY  2001   laminectomy lumbar spine   CATARACT EXTRACTION  1992   Insert Prosthetic Lens   COLONOSCOPY WITH PROPOFOL N/A 02/28/2017    Procedure: COLONOSCOPY WITH PROPOFOL;  Surgeon: Scot Jun, MD;  Location: [facility] ENDOSCOPY;  Service: Endoscopy;  Laterality: N/A;   COLONOSCOPY WITH PROPOFOL N/A 06/03/2020   Procedure: COLONOSCOPY WITH PROPOFOL;  Surgeon: Regis Bill, MD;  Location: [facility] ENDOSCOPY;  Service: Endoscopy;  Laterality: N/A;   EYE SURGERY     KYPHOPLASTY N/A 08/08/2018   Procedure: [id number];  Surgeon: Kennedy Bucker, MD;  Location: [facility] ORS;  Service: Orthopedics;  Laterality: N/A;   LAMINECTOMY  2001   lumbar   MULTIPLE TOOTH EXTRACTIONS     2018   TIBIA FRACTURE SURGERY     TONSILLECTOMY     WRIST SURGERY Right    x2    Family History  Problem Relation Age of Onset   Colon polyps Mother    Retinitis pigmentosa Mother    Retinitis pigmentosa Maternal Grandmother    Cancer Maternal Grandfather    Diabetes Paternal Grandmother    Alcohol abuse Father     Social History   Tobacco Use   Smoking status: Some Days    Packs/day: 0.75    Years: 35.00    Additional pack years: 0.00    Total pack years: 26.25    Types: Cigarettes   Smokeless tobacco: Never   Tobacco comments:    Quit smoking for 9 years, started again Feb 2022    Started at age 14 and smokes for a total of 30 plus years   Substance Use Topics   Alcohol use: Yes    Comment: 15 beers per day     Current Outpatient Medications:    atenolol (TENORMIN) 50 MG  tablet, TAKE ONE TABLET BY MOUTH DAILY, Disp: 90 tablet, Rfl: 1   atorvastatin (LIPITOR) 20 MG tablet, TAKE ONE TABLET BY MOUTH ONCE A DAY AT SIX IN THE EVENING, Disp: 90 tablet, Rfl: 1   colchicine 0.6 MG tablet, TAKE 1 TABLET BY MOUTH TWICE DAILY AS NEEDED. TAKE ONE AND MAY REPEAT A SECONDDOSE IN 2 HOURS, MAY STOP WHEN PAIN RESOLVES, Disp: 30 tablet, Rfl: 1   Cyanocobalamin (B-12) 1000 MCG SUBL, Place 1 tablet under the tongue daily., Disp: 100 tablet, Rfl: 1   esomeprazole (NEXIUM) 40 MG capsule, TAKE ONE CAPSULE BY MOUTH DAILY, Disp: 90 capsule, Rfl: 1    furosemide (LASIX) 40 MG tablet, TAKE ONE TABLET BY MOUTH ONCE A DAY AS NEEDED, Disp: 90 tablet, Rfl: 0   lisinopril (ZESTRIL) 5 MG tablet, TAKE ONE TABLET BY MOUTH DAILY, Disp: 90 tablet, Rfl: 1   meclizine (ANTIVERT) 25 MG tablet, Take 0.5-1 tablets (12.5-25 mg total) by mouth 3 (three) times daily as needed for dizziness., Disp: 30 tablet, Rfl: 0   omega-3 acid ethyl esters (LOVAZA) 1 g capsule, TAKE TWO CAPSULES BY MOUTH TWO TIMES DAILY, Disp: 360 capsule, Rfl: 1  No Known Allergies  I personally reviewed active problem list, medication list, allergies, family history, social history, health maintenance with the patient/caregiver today.   ROS  ***  Objective  There were no vitals filed for this visit.  There is no height or weight on file to calculate BMI.  Physical Exam ***   PHQ2/9:    03/09/2023    1:35 PM 11/21/2022    2:03 PM 10/31/2022   10:07 AM 08/01/2022   11:00 AM 01/30/2022   10:05 AM  Depression screen PHQ 2/9  Decreased Interest 0 0 0 0 0  Down, Depressed, Hopeless 0 0 0 0 0  PHQ - 2 Score 0 0 0 0 0  Altered sleeping 0 0 0 0 0  Tired, decreased energy 0 0 0 0 0  Change in appetite 0 0 0 0 0  Feeling bad or failure about yourself  0 0 0 0 0  Trouble concentrating 0 0 0 0 0  Moving slowly or fidgety/restless 0 0 0 0 0  Suicidal thoughts 0 0 0 0 0  PHQ-9 Score 0 0 0 0 0  Difficult doing work/chores Not difficult at all Not difficult at all       phq 9 is {gen pos [id number]}   Fall Risk:    03/09/2023    1:34 PM 11/21/2022    2:03 PM 10/31/2022   10:06 AM 08/01/2022   10:59 AM 01/30/2022   10:05 AM  Fall Risk   Falls in the past year? 0 0 0 0 0  Number falls in past yr: 0 0 0 0 0  Injury with Fall? 0 0 0 0 0  Risk for fall due to : No Fall Risks No Fall Risks Impaired vision No Fall Risks No Fall Risks  Follow up Falls prevention discussed;Education provided;Falls evaluation completed Falls prevention discussed;Education provided;Falls evaluation  completed Falls prevention discussed Falls prevention discussed Falls prevention discussed      Functional Status Survey:      Assessment & Plan  *** There are no diagnoses linked to this encounter.

## 2023-03-20 ENCOUNTER — Ambulatory Visit: Payer: 59 | Admitting: Family Medicine

## 2023-03-28 ENCOUNTER — Ambulatory Visit: Payer: 59 | Admitting: Internal Medicine

## 2023-05-15 NOTE — Progress Notes (Unsigned)
Name: Joel Bryant   MRN: [id number]    DOB: Jan 07, 1961   Date:05/16/2023       Progress Note  Subjective  Chief Complaint  Follow Up  HPI  DM II with complications. diagnosed years ago, he was on Januvia and Metformin but off medications for years now.  A1C is still at goal, today it was 5.7 % He still has some neuropathy/paresthesia on toes but stable and mild,  he still has microalbuminuria and is on ACE inhibitor.   Denies polyphagia, polyuria or polydipsia .   Weight loss: seen by Dr. Andee Poles because due to difficulty swallowing, he had CT neck and showed some thickness on his esophagus, he states he was advised to resume Nexium and Fexofenadine  ( but he stopped taking it Allegra)  He was advised to see GI and had an incomplete colonoscopy due to poor prep. He continues to have some dysphagia but states difficulty going back to GI due to poor vision.   Alcoholisms: he is not ready to quit, he drinks one six pack of beer per day, refuses AA referral. He went to [facility] for evaluation of confusion and fall with head trauma 05/14 and alcohol level was 345   Aorta Atherosclerosis: found  on CT abdomen done 05/22/2018 , he usually takes Atorvastatin  and aspirin, denies side effects . Reviewed last labs with him   History of drug abuse: he states he has used all kinds of drugs, including cocaine, heroine, he states he quit all illegal drugs in 2019  He states cannot go to the pain clinic because of his history of drug addiction and is in constant  pain - on his back but the pain is no longer severe   Chronic back pain, failed back surgery, also has Osteonecrosis of hips and chronic hip and knee pain: he has difficulty sleeping well, has to shift positions. Pain is daily but not severe , currently is 1-2/10 . He denies bowel or bladder incontinence, he used to take  baclofen made him feel loopy he stopped gabapentin by himself and states he is doing fine   HTN: he forgot to bring his medications  today but states taking it as prescribed, bp is at goal today, no chest pain or palpitation   Smoker's cough: he started smoking at age 66 , quit for 9 years and resumed smoking in 2022, he smokes less than one pack per day now.  He coughs daily and symptoms are stable.   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   Brain Atrophy: found on CT brain, discussed importance of cutting down on alcohol, continue statin therapy, bp control and regular activity  Peri-umbilical hernia: he states getting large and bothering him, he asked for referral to surgeon  Abnormal CT liver: likely has cirrhosis, he has to take lasix prn for edema, discussed evaluation by GI but he is not interested at this time, he also does not want to quit drinking   Patient Active Problem List   Diagnosis Date Noted   GAD (generalized anxiety disorder) 11/20/2020   History of drug abuse in remission (HCC) 04/30/2020   Closed compression fracture of thoracic vertebra (HCC) 06/13/2018   Fatty liver 06/13/2018   Aortic atherosclerosis (HCC) 06/13/2018   Chronic pain syndrome 09/06/2016   Dyslipidemia associated with type 2 diabetes mellitus (HCC) 09/27/2015   H/O adenomatous polyp of colon 09/27/2015   Dysmetabolic syndrome 09/27/2015   Post Laminectomy Syndrome (L4-5 Hemilaminectomy and partial Facetectomy) 09/27/2015  Lumbar spondylosis 09/27/2015   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   Epidural fibrosis 09/27/2015   Chronic low back pain (Location of Primary Source of Pain) (Bilateral) (L>R) 09/27/2015   Chronic left knee pain 09/27/2015   Lumbar spinal stenosis (L4-5) 09/27/2015   Lumbar foraminal stenosis (Bilateral L3-4 and L4-5) 09/27/2015   Lumbar facet hypertrophy (Bilateral, Multilevel) 09/27/2015   Lumbar facet syndrome (Bilateral) 09/27/2015   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) 09/27/2015   Chronic hip pain (Location of Secondary source of pain) (Bilateral) (L>R) (secondary to  osteonecrosis) 09/27/2015   Edema leg 09/13/2015   Gout 09/13/2015   Obstructive apnea 09/13/2015   Anxiety disorder 06/11/2015   Hypertriglyceridemia 06/11/2015   Dyslipidemia 06/11/2015   Controlled type 2 diabetes mellitus with diabetic neuropathy, without long-term current use of insulin (HCC) 06/11/2015   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   Retinitis pigmentosa 06/11/2015    Past Surgical History:  Procedure Laterality Date   BACK SURGERY  2001   laminectomy lumbar spine   CATARACT EXTRACTION  1992   Insert Prosthetic Lens   COLONOSCOPY WITH PROPOFOL N/A 02/28/2017   Procedure: COLONOSCOPY WITH PROPOFOL;  Surgeon: Scot Jun, MD;  Location: [facility] ENDOSCOPY;  Service: Endoscopy;  Laterality: N/A;   COLONOSCOPY WITH PROPOFOL N/A 06/03/2020   Procedure: COLONOSCOPY WITH PROPOFOL;  Surgeon: Regis Bill, MD;  Location: [facility] ENDOSCOPY;  Service: Endoscopy;  Laterality: N/A;   EYE SURGERY     KYPHOPLASTY N/A 08/08/2018   Procedure: [id number];  Surgeon: Kennedy Bucker, MD;  Location: [facility] ORS;  Service: Orthopedics;  Laterality: N/A;   LAMINECTOMY  2001   lumbar   MULTIPLE TOOTH EXTRACTIONS     2018   TIBIA FRACTURE SURGERY     TONSILLECTOMY     WRIST SURGERY Right    x2    Family History  Problem Relation Age of Onset   Colon polyps Mother    Retinitis pigmentosa Mother    Retinitis pigmentosa Maternal Grandmother    Cancer Maternal Grandfather    Diabetes Paternal Grandmother    Alcohol abuse Father     Social History   Tobacco Use   Smoking status: Some Days    Current packs/day: 0.75    Average packs/day: 0.8 packs/day for 35.0 years (26.3 ttl pk-yrs)    Types: Cigarettes   Smokeless tobacco: Never   Tobacco comments:    Quit smoking for 9 years, started again Feb 2022    Started at age 36 and smokes for a total of 30 plus years   Substance Use Topics   Alcohol use: Yes    Comment: 15 beers per day     Current Outpatient Medications:     atenolol (TENORMIN) 50 MG tablet, TAKE ONE TABLET BY MOUTH DAILY, Disp: 90 tablet, Rfl: 1   atorvastatin (LIPITOR) 20 MG tablet, TAKE ONE TABLET BY MOUTH ONCE A DAY AT SIX IN THE EVENING, Disp: 90 tablet, Rfl: 1   colchicine 0.6 MG tablet, TAKE 1 TABLET BY MOUTH TWICE DAILY AS NEEDED. TAKE ONE AND MAY REPEAT A SECONDDOSE IN 2 HOURS, MAY STOP WHEN PAIN RESOLVES, Disp: 30 tablet, Rfl: 1   Cyanocobalamin (B-12) 1000 MCG SUBL, Place 1 tablet under the tongue daily., Disp: 100 tablet, Rfl: 1   esomeprazole (NEXIUM) 40 MG capsule, TAKE ONE CAPSULE BY MOUTH DAILY, Disp: 90 capsule, Rfl: 1   furosemide (LASIX) 40 MG tablet, TAKE ONE TABLET BY MOUTH ONCE A DAY AS NEEDED, Disp: 90 tablet, Rfl:  0   lisinopril (ZESTRIL) 5 MG tablet, TAKE ONE TABLET BY MOUTH DAILY, Disp: 90 tablet, Rfl: 1   meclizine (ANTIVERT) 25 MG tablet, Take 0.5-1 tablets (12.5-25 mg total) by mouth 3 (three) times daily as needed for dizziness., Disp: 30 tablet, Rfl: 0   omega-3 acid ethyl esters (LOVAZA) 1 g capsule, TAKE TWO CAPSULES BY MOUTH TWO TIMES DAILY, Disp: 360 capsule, Rfl: 1  No Known Allergies  I personally reviewed active problem list, medication list, allergies, family history, social history, health maintenance with the patient/caregiver today.   ROS  Ten systems reviewed and is negative except as mentioned in HPI    Objective  Vitals:   05/16/23 1038  BP: 130/72  Pulse: 74  Resp: 16  SpO2: 94%  Weight: 218 lb (98.9 kg)  Height: 5\' 8"  (1.727 m)    Body mass index is 33.15 kg/m.  Physical Exam  Constitutional: Patient appears well-developed and well-nourished. Obese  No distress.  HEENT: head atraumatic, normocephalic, pupils equal and reactive to light, neck supple Cardiovascular: Normal rate, regular rhythm and normal heart sounds.  No murmur heard. No BLE edema. Pulmonary/Chest: Effort normal and breath sounds normal. No respiratory distress. Abdominal: Soft.  There is no tenderness, large  peri-umbilical hernia  Psychiatric: Patient has a normal mood and affect. behavior is normal. Judgment and thought content normal.    PHQ2/9:    05/16/2023   10:38 AM 03/09/2023    1:35 PM 11/21/2022    2:03 PM 10/31/2022   10:07 AM 08/01/2022   11:00 AM  Depression screen PHQ 2/9  Decreased Interest 0 0 0 0 0  Down, Depressed, Hopeless 0 0 0 0 0  PHQ - 2 Score 0 0 0 0 0  Altered sleeping 0 0 0 0 0  Tired, decreased energy 0 0 0 0 0  Change in appetite 0 0 0 0 0  Feeling bad or failure about yourself  0 0 0 0 0  Trouble concentrating 0 0 0 0 0  Moving slowly or fidgety/restless 0 0 0 0 0  Suicidal thoughts 0 0 0 0 0  PHQ-9 Score 0 0 0 0 0  Difficult doing work/chores  Not difficult at all Not difficult at all      phq 9 is negative   Fall Risk:    05/16/2023   10:38 AM 03/09/2023    1:34 PM 11/21/2022    2:03 PM 10/31/2022   10:06 AM 08/01/2022   10:59 AM  Fall Risk   Falls in the past year? 0 0 0 0 0  Number falls in past yr: 0 0 0 0 0  Injury with Fall? 0 0 0 0 0  Risk for fall due to : Impaired vision No Fall Risks No Fall Risks Impaired vision No Fall Risks  Follow up Falls prevention discussed Falls prevention discussed;Education provided;Falls evaluation completed Falls prevention discussed;Education provided;Falls evaluation completed Falls prevention discussed Falls prevention discussed      Functional Status Survey: Is the patient deaf or have difficulty hearing?: No Does the patient have difficulty seeing, even when wearing glasses/contacts?: Yes Does the patient have difficulty concentrating, remembering, or making decisions?: No Does the patient have difficulty walking or climbing stairs?: Yes Does the patient have difficulty dressing or bathing?: Yes Does the patient have difficulty doing errands alone such as visiting a doctor's office or shopping?: Yes    Assessment & Plan  1. Controlled type 2 diabetes mellitus with diabetic neuropathy, without long-term  current use of insulin (HCC)  - POCT HgB A1C  2. Alcoholism (HCC)  - COMPLETE METABOLIC PANEL WITH GFR - Vitamin B1  3. Thrombocytopenia (HCC)  - CBC with Differential/Platelet  4. Cerebral atrophy (HCC)  On CT brain, discussed with patient   5. Aortic atherosclerosis (HCC)  On statin therapy   6. Smokers' cough (HCC)  Does not want medication  7. History of drug abuse in remission (HCC)  Since 2019   8. Chronic low back pain (Location of Primary Source of Pain) (Bilateral) (L>R)  Stable   9. Abnormal liver CT  Discussed with patient  10. Essential hypertension  - COMPLETE METABOLIC PANEL WITH GFR  11. Periumbilical hernia  - Ambulatory referral to General Surgery  12. Anemia, unspecified type  - Iron, TIBC and Ferritin Panel

## 2023-05-16 ENCOUNTER — Ambulatory Visit (INDEPENDENT_AMBULATORY_CARE_PROVIDER_SITE_OTHER): Payer: 59 | Admitting: Family Medicine

## 2023-05-16 ENCOUNTER — Encounter: Payer: Self-pay | Admitting: Family Medicine

## 2023-05-16 VITALS — BP 130/72 | HR 74 | Resp 16 | Ht 68.0 in | Wt 218.0 lb

## 2023-05-16 DIAGNOSIS — I10 Essential (primary) hypertension: Secondary | ICD-10-CM | POA: Diagnosis not present

## 2023-05-16 DIAGNOSIS — K429 Umbilical hernia without obstruction or gangrene: Secondary | ICD-10-CM

## 2023-05-16 DIAGNOSIS — G319 Degenerative disease of nervous system, unspecified: Secondary | ICD-10-CM | POA: Diagnosis not present

## 2023-05-16 DIAGNOSIS — D649 Anemia, unspecified: Secondary | ICD-10-CM

## 2023-05-16 DIAGNOSIS — F1020 Alcohol dependence, uncomplicated: Secondary | ICD-10-CM | POA: Diagnosis not present

## 2023-05-16 DIAGNOSIS — D696 Thrombocytopenia, unspecified: Secondary | ICD-10-CM | POA: Diagnosis not present

## 2023-05-16 DIAGNOSIS — R932 Abnormal findings on diagnostic imaging of liver and biliary tract: Secondary | ICD-10-CM

## 2023-05-16 DIAGNOSIS — M5442 Lumbago with sciatica, left side: Secondary | ICD-10-CM | POA: Diagnosis not present

## 2023-05-16 DIAGNOSIS — J410 Simple chronic bronchitis: Secondary | ICD-10-CM | POA: Diagnosis not present

## 2023-05-16 DIAGNOSIS — F1911 Other psychoactive substance abuse, in remission: Secondary | ICD-10-CM

## 2023-05-16 DIAGNOSIS — I700 Atherosclerosis of aorta: Secondary | ICD-10-CM | POA: Diagnosis not present

## 2023-05-16 DIAGNOSIS — F1021 Alcohol dependence, in remission: Secondary | ICD-10-CM | POA: Insufficient documentation

## 2023-05-16 DIAGNOSIS — G8929 Other chronic pain: Secondary | ICD-10-CM

## 2023-05-16 DIAGNOSIS — E1140 Type 2 diabetes mellitus with diabetic neuropathy, unspecified: Secondary | ICD-10-CM

## 2023-05-16 LAB — POCT GLYCOSYLATED HEMOGLOBIN (HGB A1C): Hemoglobin A1C: 5.7 % — AB (ref 4.0–5.6)

## 2023-05-17 LAB — CBC WITH DIFFERENTIAL/PLATELET
Eosinophils Absolute: 68 cells/uL (ref 15–500)
Hemoglobin: 13 g/dL — ABNORMAL LOW (ref 13.2–17.1)
MCH: 31.9 pg (ref 27.0–33.0)
MCV: 93.9 fL (ref 80.0–100.0)
Monocytes Relative: 15.4 %
Neutrophils Relative %: 65.5 %
Platelets: 87 10*3/uL — ABNORMAL LOW (ref 140–400)
Total Lymphocyte: 17.7 %
WBC: 7.5 10*3/uL (ref 3.8–10.8)

## 2023-05-17 LAB — COMPLETE METABOLIC PANEL WITH GFR
AG Ratio: 1.2 (calc) (ref 1.0–2.5)
ALT: 14 U/L (ref 9–46)
AST: 77 U/L — ABNORMAL HIGH (ref 10–35)
BUN/Creatinine Ratio: 12 (calc) (ref 6–22)
CO2: 27 mmol/L (ref 20–32)
Calcium: 9 mg/dL (ref 8.6–10.3)
Globulin: 3.5 g/dL (calc) (ref 1.9–3.7)
Glucose, Bld: 99 mg/dL (ref 65–99)
Sodium: 133 mmol/L — ABNORMAL LOW (ref 135–146)
Total Protein: 7.7 g/dL (ref 6.1–8.1)
eGFR: 108 mL/min/{1.73_m2} (ref >=60)

## 2023-05-17 LAB — IRON,TIBC AND FERRITIN PANEL
%SAT: 14 % (calc) — ABNORMAL LOW (ref 20–48)
Ferritin: 60 ng/mL (ref 24–380)
Iron: 58 ug/dL (ref 50–180)
TIBC: 401 mcg/dL (calc) (ref 250–425)

## 2023-05-18 ENCOUNTER — Other Ambulatory Visit: Payer: Self-pay | Admitting: Family Medicine

## 2023-05-18 ENCOUNTER — Other Ambulatory Visit: Payer: Self-pay

## 2023-05-18 DIAGNOSIS — R600 Localized edema: Secondary | ICD-10-CM

## 2023-05-20 LAB — COMPLETE METABOLIC PANEL WITH GFR
Albumin: 4.2 g/dL (ref 3.6–5.1)
Alkaline phosphatase (APISO): 122 U/L (ref 35–144)
BUN: 7 mg/dL (ref 7–25)
Chloride: 97 mmol/L — ABNORMAL LOW (ref 98–110)
Creat: 0.6 mg/dL — ABNORMAL LOW (ref 0.70–1.35)
Potassium: 3.7 mmol/L (ref 3.5–5.3)
Total Bilirubin: 1.3 mg/dL — ABNORMAL HIGH (ref 0.2–1.2)

## 2023-05-20 LAB — CBC WITH DIFFERENTIAL/PLATELET
Absolute Monocytes: 1155 cells/uL — ABNORMAL HIGH (ref 200–950)
Basophils Absolute: 38 cells/uL (ref 0–200)
Basophils Relative: 0.5 %
Eosinophils Relative: 0.9 %
HCT: 38.2 % — ABNORMAL LOW (ref 38.5–50.0)
Lymphs Abs: 1328 cells/uL (ref 850–3900)
MCHC: 34 g/dL (ref 32.0–36.0)
MPV: 10.6 fL (ref 7.5–12.5)
Neutro Abs: 4913 cells/uL (ref 1500–7800)
RBC: 4.07 10*6/uL — ABNORMAL LOW (ref 4.20–5.80)
RDW: 13 % (ref 11.0–15.0)

## 2023-05-20 LAB — VITAMIN B1: Vitamin B1 (Thiamine): 9 nmol/L (ref 8–30)

## 2023-05-23 ENCOUNTER — Ambulatory Visit (INDEPENDENT_AMBULATORY_CARE_PROVIDER_SITE_OTHER): Payer: 59 | Admitting: Surgery

## 2023-05-23 ENCOUNTER — Encounter: Payer: Self-pay | Admitting: Surgery

## 2023-05-23 VITALS — BP 152/71 | HR 78 | Temp 98.5°F | Ht 68.0 in | Wt 225.6 lb

## 2023-05-23 DIAGNOSIS — K429 Umbilical hernia without obstruction or gangrene: Secondary | ICD-10-CM

## 2023-05-23 DIAGNOSIS — K7460 Unspecified cirrhosis of liver: Secondary | ICD-10-CM | POA: Diagnosis not present

## 2023-05-23 DIAGNOSIS — R748 Abnormal levels of other serum enzymes: Secondary | ICD-10-CM

## 2023-05-23 DIAGNOSIS — K439 Ventral hernia without obstruction or gangrene: Secondary | ICD-10-CM | POA: Diagnosis not present

## 2023-05-23 NOTE — Patient Instructions (Addendum)
Your CT is scheduled for 3 pm (arrive by 12:45 pm) at [facility]. Nothing to eat or drink 4 hours prior.   Please get labs drawn at [facility]. You do not need an appointment.   If you have any concerns or questions, please feel free to call our office.   Cirrhosis  Cirrhosis is long-term (chronic) liver injury. The liver is the body's largest internal organ, and it performs many functions. It converts food into energy, removes toxic material from the blood, makes important proteins, and absorbs necessary vitamins from food. In cirrhosis, healthy liver cells are replaced by scar tissue. This prevents blood from flowing through the liver and makes it difficult for the liver to complete its functions. What are the causes? Common causes of this condition are hepatitis C and long-term alcohol abuse. Other causes include: Nonalcoholic fatty liver disease (NAFLD). This happens when fat is deposited in the liver by causes other than alcohol. Hepatitis B infection. Autoimmune hepatitis. In this condition, the body's defense system (immune system) mistakenly attacks the liver cells, causing inflammation. Diseases that cause blockage of ducts inside the liver. Inherited liver diseases, such as hemochromatosis. This is one of the most common inherited liver diseases. In this disease, deposits of iron collect in the liver and other organs. Reactions to certain long-term medicines, such as amiodarone, a heart medicine. Parasitic infections. These include schistosomiasis, which is caused by a flatworm. Long-term contact to certain toxins. These toxins include certain organic solvents, such as toluene and chloroform. What increases the risk? You are more likely to develop this condition if: You have certain types of viral hepatitis. You abuse alcohol, especially if you are male. You are overweight. You use IV drugs and share needles. You have unprotected sex with someone who has viral  hepatitis. What are the signs or symptoms? You may not have any signs and symptoms at first. Symptoms may not develop until the damage to your liver starts to get worse. Early symptoms may include: Weakness and tiredness (fatigue). Changes in sleep patterns or having trouble sleeping. Itchiness. Tenderness in the right-upper part of your abdomen. Weight loss and muscle loss. Nausea. Loss of appetite. Later symptoms may include: Fatigue or weakness that is getting worse. Yellow skin and eyes (jaundice). Buildup of fluid in the abdomen (ascites). You may notice that your clothes are tight around your waist. Weight gain and swelling of the feet and ankles (edema). Trouble breathing. Easy bruising and bleeding. Vomiting blood, or black or bloody stool. Mental confusion. How is this diagnosed? Your health care provider may suspect cirrhosis based on your symptoms and medical history, especially if you have other medical conditions or a history of alcohol abuse. Your health care provider will do a physical exam to feel your liver and to check for signs of cirrhosis. Tests may include: Blood tests to check: For hepatitis B or C. Kidney function. Liver function. Imaging tests such as: MRI or CT scan to look for changes seen in advanced cirrhosis. Ultrasound to see if normal liver tissue is being replaced by scar tissue. A procedure in which a long needle is used to take a sample of liver tissue to be checked in a lab (biopsy). Liver biopsy can confirm the diagnosis of cirrhosis. How is this treated? Treatment for this condition depends on how damaged your liver is and what caused the damage. It may include treating the symptoms of cirrhosis, or treating the underlying causes to slow the damage. Treatment may  include: Making lifestyle changes, such as: Eating a healthy diet. You may need to work with your health care provider or a dietitian to develop an eating plan. Restricting salt  intake. Maintaining a healthy weight. Not abusing drugs or alcohol. Taking medicines to: Treat liver infections or other infections. Control itching. Reduce fluid buildup. Reduce certain blood toxins. Reduce risk of bleeding from enlarged blood vessels in the stomach or esophagus (varices). Liver transplant. In this procedure, a liver from a donor is used to replace your diseased liver. This is done if cirrhosis has caused liver failure. Other treatments and procedures may be done depending on the problems that you get from cirrhosis. Common problems include liver-related kidney failure (hepatorenal syndrome). Follow these instructions at home:  Take medicines only as told by your health care provider. Do not use medicines that are toxic to your liver. Ask your health care provider before taking any new medicines, including over-the-counter medicines such as NSAIDs. Rest as needed. Eat a well-balanced diet. Limit your salt or water intake, if your health care provider asks you to do this. Do not drink alcohol. This is especially important if you routinely take acetaminophen. Keep all follow-up visits. This is important. Contact a health care provider if you: Have fatigue or weakness that is getting worse. Develop swelling of the hands, feet, or legs, or a buildup of fluid in the abdomen (ascites). Have a fever or chills. Develop loss of appetite. Have nausea or vomiting. Develop jaundice. Develop easy bruising or bleeding. Get help right away if you: Vomit bright red blood or a material that looks like coffee grounds. Have blood in your stools. Notice that your stools appear black and tarry. Become confused. Have chest pain or trouble breathing. These symptoms may represent a serious problem that is an emergency. Do not wait to see if the symptoms will go away. Get medical help right away. Call your local emergency services (911 in the [place] Do not drive yourself to the  hospital. Summary Cirrhosis is chronic liver injury. Common causes are hepatitis C and long-term alcohol abuse. Tests used to diagnose cirrhosis include blood tests, imaging tests, and liver biopsy. Treatment for this condition involves treating the underlying cause. Avoid alcohol, drugs, salt, and medicines that may damage your liver. Get help right away if you vomit bright red blood or a material that looks like coffee grounds. This information is not intended to replace advice given to you by your health care provider. Make sure you discuss any questions you have with your health care provider. Document Revised: 07/27/2020 Document Reviewed: 07/29/2020 Elsevier Patient Education  2024 [employer].

## 2023-05-24 NOTE — Progress Notes (Signed)
Patient ID: Joel Bryant, male   DOB: 09-27-1961, 62 y.o.   MRN: [id number]  HPI Joel Bryant is a 62 y.o. male seen in consultation for ventral hernia.  The patient reports that for the last few months he now feels a bulge above the umbilicus.  He does have intermittent pain that is dull mild and worsening with Valsalva and strenuous activities.  He does have a significant history of cirrhosis and also has daily EtOH intake.  He also is blind and had a Prior history of substance abuse. Did have history of falls.  He also had last year a CT scan of the abdomen pelvis that I have personally reviewed showing evidence of cirrhosis and also evidence of an epigastric hernia.  Not clear-cut signs of portal hypertension.  Has chronic elevation of the LFTs.  Last AST was 77, normal ALT and a bilirubin of 1.3.  Sodium is 133 Also has thrombocytopenia with latest platelets of 87,000 suggesting portal hypertension. He is accompanied by his mother HPI  Past Medical History:  Diagnosis Date   Anxiety    Aortic atherosclerosis (HCC) 06/13/2018   July 2019 CT scan   Chronic LBP 09/13/2015   Chronic left hip pain 09/27/2015   Diabetes mellitus without complication (HCC)    Fracture, vertebra, pathologic    Gout    Hyperlipidemia    Hypertension    Hypomagnesemia    Retinitis pigmentosa    Sleep apnea     Past Surgical History:  Procedure Laterality Date   BACK SURGERY  2001   laminectomy lumbar spine   CATARACT EXTRACTION  1992   Insert Prosthetic Lens   COLONOSCOPY WITH PROPOFOL N/A 02/28/2017   Procedure: COLONOSCOPY WITH PROPOFOL;  Surgeon: Scot Jun, MD;  Location: [facility] ENDOSCOPY;  Service: Endoscopy;  Laterality: N/A;   COLONOSCOPY WITH PROPOFOL N/A 06/03/2020   Procedure: COLONOSCOPY WITH PROPOFOL;  Surgeon: Regis Bill, MD;  Location: [facility] ENDOSCOPY;  Service: Endoscopy;  Laterality: N/A;   EYE SURGERY     KYPHOPLASTY N/A 08/08/2018   Procedure: [id number];  Surgeon:  Kennedy Bucker, MD;  Location: [facility] ORS;  Service: Orthopedics;  Laterality: N/A;   LAMINECTOMY  2001   lumbar   MULTIPLE TOOTH EXTRACTIONS     2018   TIBIA FRACTURE SURGERY     TONSILLECTOMY     WRIST SURGERY Right    x2    Family History  Problem Relation Age of Onset   Colon polyps Mother    Retinitis pigmentosa Mother    Retinitis pigmentosa Maternal Grandmother    Cancer Maternal Grandfather    Diabetes Paternal Grandmother    Alcohol abuse Father     Social History Social History   Tobacco Use   Smoking status: Some Days    Current packs/day: 0.75    Average packs/day: 0.8 packs/day for 35.0 years (26.3 ttl pk-yrs)    Types: Cigarettes   Smokeless tobacco: Never   Tobacco comments:    Quit smoking for 9 years, started again Feb 2022    Started at age 41 and smokes for a total of 30 plus years   Vaping Use   Vaping status: Never Used  Substance Use Topics   Alcohol use: Yes    Comment: 15 beers per day   Drug use: Not Currently    Types: Marijuana, "Crack" cocaine, Cocaine, Other-see comments    Comment: Cocaine use previously; marijuana currently occasionally    No Known Allergies  Current Outpatient  Medications  Medication Sig Dispense Refill   atenolol (TENORMIN) 50 MG tablet TAKE ONE TABLET BY MOUTH DAILY 90 tablet 1   atorvastatin (LIPITOR) 20 MG tablet TAKE ONE TABLET BY MOUTH ONCE A DAY AT SIX IN THE EVENING 90 tablet 1   colchicine 0.6 MG tablet TAKE 1 TABLET BY MOUTH TWICE DAILY AS NEEDED. TAKE ONE AND MAY REPEAT A SECONDDOSE IN 2 HOURS, MAY STOP WHEN PAIN RESOLVES 30 tablet 1   Cyanocobalamin (B-12) 1000 MCG SUBL Place 1 tablet under the tongue daily. 100 tablet 1   esomeprazole (NEXIUM) 40 MG capsule TAKE ONE CAPSULE BY MOUTH DAILY 90 capsule 1   furosemide (LASIX) 40 MG tablet TAKE ONE TABLET BY MOUTH ONCE A DAY AS NEEDED 90 tablet 0   lisinopril (ZESTRIL) 5 MG tablet TAKE ONE TABLET BY MOUTH DAILY 90 tablet 1   meclizine (ANTIVERT) 25 MG tablet  Take 0.5-1 tablets (12.5-25 mg total) by mouth 3 (three) times daily as needed for dizziness. 30 tablet 0   omega-3 acid ethyl esters (LOVAZA) 1 g capsule TAKE TWO CAPSULES BY MOUTH TWO TIMES DAILY 360 capsule 1   No current facility-administered medications for this visit.     Review of Systems Full ROS  was asked and was negative except for the information on the HPI  Physical Exam Blood pressure (!) 152/71, pulse 78, temperature 98.5 F (36.9 C), temperature source Oral, height 5\' 8"  (1.727 m), weight 225 lb 9.6 oz (102.3 kg), SpO2 95%. CONSTITUTIONAL: Chronically ill. EYES: Pupils are equal, round, Sclera are non-icteric. EARS, NOSE, MOUTH AND THROAT: The oropharynx is clear. The oral mucosa is pink and moist. Hearing is intact to voice. LYMPH NODES:  Lymph nodes in the neck are normal. RESPIRATORY:  Lungs are clear. There is normal respiratory effort, with equal breath sounds bilaterally, and without pathologic use of accessory muscles. CARDIOVASCULAR: Heart is regular without murmurs, gallops, or rubs. GI: The abdomen is  soft, nontender, and nondistended.  Evidence of a 3 cm epigastric hernia that is reducible somewhat tender.  Difficult to say whether or not he is got a cystitis but seems to be positive for bursitis there are no palpable masses. There is no hepatosplenomegaly. There are normal bowel sounds in all quadrants. GU: Rectal deferred.   MUSCULOSKELETAL: Normal muscle strength and tone.  Evidence of pitting edema up to the knees SKIN: Turgor is good and there are no pathologic skin lesions or ulcers. NEUROLOGIC: Motor and sensation is grossly normal. Cranial nerves are grossly intact. PSYCH:  Oriented to person, place and time. Affect is normal.  Data Reviewed  I have personally reviewed the patient's imaging, laboratory findings and medical records.    Assessment/Plan 62 year old male with symptomatic epigastric hernia in the setting of cirrhosis.  It is not clear  whether or not he has some ascites my feeling is that is slowly over the last year his liver reserve has deteriorated and he probably has developed some ascites making the hernia more evident.  Will like to assess his liver function again with CMP as well as INR and obtain a CT scan of the abdomen pelvis to rule out the late the hernia and potential portal hypertension. Currently he was not aware of his cirrhosis.  I updated him about the condition.  My feeling is that he may have some potential encephalopathy that may impair her memory.  Also updated the mother in detail about condition He is definitely high risk for any operative intervention.  Will need to be optimized before we can attempt any surgical intervention.  Will also send him to GI hepatology for management of cirrhosis.  She will probably needs to be on a diuretic.  I will see him back in a few weeks.  No need for surgical intervention at this time.  Please note I spent 60 minutes in this encounter including personally reviewing medical records, images studies, coordinating his care, placing orders, counseling the patient and performing    Sterling Big, MD FACS General Surgeon 05/24/2023, 7:40 AM

## 2023-05-25 ENCOUNTER — Other Ambulatory Visit: Payer: Self-pay

## 2023-05-25 DIAGNOSIS — R748 Abnormal levels of other serum enzymes: Secondary | ICD-10-CM

## 2023-05-30 ENCOUNTER — Ambulatory Visit
Admission: RE | Admit: 2023-05-30 | Discharge: 2023-05-30 | Disposition: A | Discharge status: Home / Self Care | Payer: 59 | Source: Ambulatory Visit | Attending: Surgery | Admitting: Surgery

## 2023-05-30 ENCOUNTER — Other Ambulatory Visit: Admission: RE | Admit: 2023-05-30 | Payer: 59 | Source: Ambulatory Visit

## 2023-05-30 DIAGNOSIS — R748 Abnormal levels of other serum enzymes: Secondary | ICD-10-CM | POA: Insufficient documentation

## 2023-05-30 DIAGNOSIS — R188 Other ascites: Secondary | ICD-10-CM | POA: Diagnosis not present

## 2023-05-30 DIAGNOSIS — K766 Portal hypertension: Secondary | ICD-10-CM | POA: Diagnosis not present

## 2023-05-30 DIAGNOSIS — K7460 Unspecified cirrhosis of liver: Secondary | ICD-10-CM | POA: Insufficient documentation

## 2023-05-30 DIAGNOSIS — K429 Umbilical hernia without obstruction or gangrene: Secondary | ICD-10-CM | POA: Insufficient documentation

## 2023-05-30 LAB — CBC
HCT: 35 % — ABNORMAL LOW (ref 39.0–52.0)
Hemoglobin: 12.2 g/dL — ABNORMAL LOW (ref 13.0–17.0)
MCH: 32.2 pg (ref 26.0–34.0)
MCHC: 34.9 g/dL (ref 30.0–36.0)
MCV: 92.3 fL (ref 80.0–100.0)
Platelets: 61 10*3/uL — ABNORMAL LOW (ref 150–400)
RBC: 3.79 MIL/uL — ABNORMAL LOW (ref 4.22–5.81)
RDW: 13.2 % (ref 11.5–15.5)
WBC: 5.5 10*3/uL (ref 4.0–10.5)
nRBC: 0 % (ref 0.0–0.2)

## 2023-05-30 LAB — COMPREHENSIVE METABOLIC PANEL
ALT: 22 U/L (ref 0–44)
AST: 109 U/L — ABNORMAL HIGH (ref 15–41)
Albumin: 3.5 g/dL (ref 3.5–5.0)
Alkaline Phosphatase: 127 U/L — ABNORMAL HIGH (ref 38–126)
Anion gap: 9 (ref 5–15)
BUN: 7 mg/dL — ABNORMAL LOW (ref 8–23)
CO2: 24 mmol/L (ref 22–32)
Calcium: 8.1 mg/dL — ABNORMAL LOW (ref 8.9–10.3)
Chloride: 93 mmol/L — ABNORMAL LOW (ref 98–111)
Creatinine, Ser: 0.58 mg/dL — ABNORMAL LOW (ref 0.61–1.24)
GFR, Estimated: >60 mL/min (ref >60)
Glucose, Bld: 112 mg/dL — ABNORMAL HIGH (ref 70–99)
Potassium: 3.6 mmol/L (ref 3.5–5.1)
Sodium: 126 mmol/L — ABNORMAL LOW (ref 135–145)
Total Bilirubin: 1.9 mg/dL — ABNORMAL HIGH (ref 0.3–1.2)
Total Protein: 7.5 g/dL (ref 6.5–8.1)

## 2023-05-30 LAB — PHOSPHORUS: Phosphorus: 4.1 mg/dL (ref 2.5–4.6)

## 2023-05-30 LAB — MAGNESIUM: Magnesium: 1.4 mg/dL — ABNORMAL LOW (ref 1.7–2.4)

## 2023-05-30 LAB — PROTIME-INR
INR: 1.4 — ABNORMAL HIGH (ref 0.8–1.2)
Prothrombin Time: 16.9 seconds — ABNORMAL HIGH (ref 11.4–15.2)

## 2023-05-30 MED ORDER — IOHEXOL 300 MG/ML  SOLN
100.0000 mL | Freq: Once | INTRAMUSCULAR | Status: AC | PRN
  Administered 2023-05-30: 100 mL via INTRAVENOUS

## 2023-06-04 ENCOUNTER — Other Ambulatory Visit: Payer: Self-pay | Admitting: Family Medicine

## 2023-06-04 DIAGNOSIS — R600 Localized edema: Secondary | ICD-10-CM

## 2023-06-06 ENCOUNTER — Ambulatory Visit (INDEPENDENT_AMBULATORY_CARE_PROVIDER_SITE_OTHER): Payer: 59 | Admitting: Surgery

## 2023-06-06 ENCOUNTER — Encounter: Payer: Self-pay | Admitting: Surgery

## 2023-06-06 VITALS — BP 145/76 | HR 76 | Temp 98.5°F | Ht 68.0 in | Wt 231.4 lb

## 2023-06-06 DIAGNOSIS — R748 Abnormal levels of other serum enzymes: Secondary | ICD-10-CM | POA: Diagnosis not present

## 2023-06-06 DIAGNOSIS — K766 Portal hypertension: Secondary | ICD-10-CM | POA: Diagnosis not present

## 2023-06-06 DIAGNOSIS — K7031 Alcoholic cirrhosis of liver with ascites: Secondary | ICD-10-CM

## 2023-06-06 DIAGNOSIS — R188 Other ascites: Secondary | ICD-10-CM

## 2023-06-06 DIAGNOSIS — F1010 Alcohol abuse, uncomplicated: Secondary | ICD-10-CM

## 2023-06-06 NOTE — Progress Notes (Signed)
Outpatient Surgical Follow Up  06/06/2023  Joel Bryant is an 62 y.o. male.   Chief Complaint  Patient presents with   Follow-up    Periumbilical hernia    Joel Bryant is a 62 y.o. male seenf/u for ventral hernia.   He does have a significant history of cirrhosis and also has daily EtOH intake.  He also is blind and had a Prior history of substance abuse.  He did Have a CT scan that I personally reviewed showing evidence of portal hypertension with cirrhosis and ascites and a ventral defect.  Lab work shows severe hyponatremia and evidence of thrombocytopenia reflective of portal hypertension Na 126, PL 66k , INR 1.4 Endorses worsening abdominal girth  Past Medical History:  Diagnosis Date   Anxiety    Aortic atherosclerosis (HCC) 06/13/2018   July 2019 CT scan   Chronic LBP 09/13/2015   Chronic left hip pain 09/27/2015   Diabetes mellitus without complication (HCC)    Fracture, vertebra, pathologic    Gout    Hyperlipidemia    Hypertension    Hypomagnesemia    Retinitis pigmentosa    Sleep apnea     Past Surgical History:  Procedure Laterality Date   BACK SURGERY  2001   laminectomy lumbar spine   CATARACT EXTRACTION  1992   Insert Prosthetic Lens   COLONOSCOPY WITH PROPOFOL N/A 02/28/2017   Procedure: COLONOSCOPY WITH PROPOFOL;  Surgeon: Scot Jun, MD;  Location: [facility] ENDOSCOPY;  Service: Endoscopy;  Laterality: N/A;   COLONOSCOPY WITH PROPOFOL N/A 06/03/2020   Procedure: COLONOSCOPY WITH PROPOFOL;  Surgeon: Regis Bill, MD;  Location: [facility] ENDOSCOPY;  Service: Endoscopy;  Laterality: N/A;   EYE SURGERY     KYPHOPLASTY N/A 08/08/2018   Procedure: [id number];  Surgeon: Kennedy Bucker, MD;  Location: [facility] ORS;  Service: Orthopedics;  Laterality: N/A;   LAMINECTOMY  2001   lumbar   MULTIPLE TOOTH EXTRACTIONS     2018   TIBIA FRACTURE SURGERY     TONSILLECTOMY     WRIST SURGERY Right    x2    Family History  Problem Relation Age of Onset    Colon polyps Mother    Retinitis pigmentosa Mother    Retinitis pigmentosa Maternal Grandmother    Cancer Maternal Grandfather    Diabetes Paternal Grandmother    Alcohol abuse Father     Social History:  reports that he has been smoking cigarettes. He has a 26.3 pack-year smoking history. He has never used smokeless tobacco. He reports current alcohol use. He reports that he does not currently use drugs after having used the following drugs: Marijuana, "Crack" cocaine, Cocaine, and Other-see comments.  Allergies: No Known Allergies  Medications reviewed.    ROS Full ROS performed and is otherwise negative other than what is stated in HPI   BP (!) 145/76   Pulse 76   Temp 98.5 F (36.9 C) (Oral)   Ht 5\' 8"  (1.727 m)   Wt 231 lb 6.4 oz (105 kg)   SpO2 94%   BMI 35.18 kg/m   Physical Exam CONSTITUTIONAL: Chronically ill. EYES: Pupils are equal, round, Sclera are non-icteric. EARS, NOSE, MOUTH AND THROAT: The oropharynx is clear. The oral mucosa is pink and moist. Hearing is intact to voice. LYMPH NODES:  Lymph nodes in the neck are normal. RESPIRATORY:  Lungs are clear. There is normal respiratory effort, with equal breath sounds bilaterally, and without pathologic use of accessory muscles. CARDIOVASCULAR: Heart is regular  without murmurs, gallops, or rubs. GI: The abdomen is  soft, nontender, and nondistended.  Evidence of a 3 cm epigastric hernia that is reducible somewhat tender.  He does Have tense ascites . Worsened as compared to my prior visit. GU: Rectal deferred.   MUSCULOSKELETAL: Normal muscle strength and tone.  Evidence of pitting edema up to the knees SKIN: Turgor is good and there are no pathologic skin lesions or ulcers. NEUROLOGIC: Motor and sensation is grossly normal. Cranial nerves are grossly intact. PSYCH:  Oriented to person, place and time. Affect is normal.   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  Discussed with the patient in detail about the urgent need for alcohol cessation as well as paracentesis and initiation of his spironolactone.  I will also refer him to hepatology and placed urgent referral.  I have actually discussed with Dr. Norma Fredrickson and Dr. Mia Creek about this patient in detail. He may need TIPPS  if medical management is unable to control the ascites. From Surgical perspective he is a prohibitive risk for any operative intervention.  I can follow him in a couple months and revisit once again but his liver failure needs to be addressed first  Please note I spent 40 minutes in this encounter including personally reviewing medical records, images studies, coordinating his care, placing orders, counseling the patient and performing    Sterling Big, MD [facility] General Surgeon

## 2023-06-06 NOTE — Patient Instructions (Addendum)
Your Ultrasound is scheduled for 06/08/2023 1 pm (arrive by 12:30 pm) at [facility].   If you have any concerns or questions, please feel free to call our office.   Cirrhosis  Cirrhosis is long-term (chronic) liver injury. The liver is the body's largest internal organ, and it performs many functions. It converts food into energy, removes toxic material from the blood, makes important proteins, and absorbs necessary vitamins from food. In cirrhosis, healthy liver cells are replaced by scar tissue. This prevents blood from flowing through the liver and makes it difficult for the liver to complete its functions. What are the causes? Common causes of this condition are hepatitis C and long-term alcohol abuse. Other causes include: Nonalcoholic fatty liver disease (NAFLD). This happens when fat is deposited in the liver by causes other than alcohol. Hepatitis B infection. Autoimmune hepatitis. In this condition, the body's defense system (immune system) mistakenly attacks the liver cells, causing inflammation. Diseases that cause blockage of ducts inside the liver. Inherited liver diseases, such as hemochromatosis. This is one of the most common inherited liver diseases. In this disease, deposits of iron collect in the liver and other organs. Reactions to certain long-term medicines, such as amiodarone, a heart medicine. Parasitic infections. These include schistosomiasis, which is caused by a flatworm. Long-term contact to certain toxins. These toxins include certain organic solvents, such as toluene and chloroform. What increases the risk? You are more likely to develop this condition if: You have certain types of viral hepatitis. You abuse alcohol, especially if you are male. You are overweight. You use IV drugs and share needles. You have unprotected sex with someone who has viral hepatitis. What are the signs or symptoms? You may not have any signs and symptoms at first. Symptoms  may not develop until the damage to your liver starts to get worse. Early symptoms may include: Weakness and tiredness (fatigue). Changes in sleep patterns or having trouble sleeping. Itchiness. Tenderness in the right-upper part of your abdomen. Weight loss and muscle loss. Nausea. Loss of appetite. Later symptoms may include: Fatigue or weakness that is getting worse. Yellow skin and eyes (jaundice). Buildup of fluid in the abdomen (ascites). You may notice that your clothes are tight around your waist. Weight gain and swelling of the feet and ankles (edema). Trouble breathing. Easy bruising and bleeding. Vomiting blood, or black or bloody stool. Mental confusion. How is this diagnosed? Your health care provider may suspect cirrhosis based on your symptoms and medical history, especially if you have other medical conditions or a history of alcohol abuse. Your health care provider will do a physical exam to feel your liver and to check for signs of cirrhosis. Tests may include: Blood tests to check: For hepatitis B or C. Kidney function. Liver function. Imaging tests such as: MRI or CT scan to look for changes seen in advanced cirrhosis. Ultrasound to see if normal liver tissue is being replaced by scar tissue. A procedure in which a long needle is used to take a sample of liver tissue to be checked in a lab (biopsy). Liver biopsy can confirm the diagnosis of cirrhosis. How is this treated? Treatment for this condition depends on how damaged your liver is and what caused the damage. It may include treating the symptoms of cirrhosis, or treating the underlying causes to slow the damage. Treatment may include: Making lifestyle changes, such as: Eating a healthy diet. You may need to work with your health care provider or a dietitian  to develop an eating plan. Restricting salt intake. Maintaining a healthy weight. Not abusing drugs or alcohol. Taking medicines to: Treat liver  infections or other infections. Control itching. Reduce fluid buildup. Reduce certain blood toxins. Reduce risk of bleeding from enlarged blood vessels in the stomach or esophagus (varices). Liver transplant. In this procedure, a liver from a donor is used to replace your diseased liver. This is done if cirrhosis has caused liver failure. Other treatments and procedures may be done depending on the problems that you get from cirrhosis. Common problems include liver-related kidney failure (hepatorenal syndrome). Follow these instructions at home:  Take medicines only as told by your health care provider. Do not use medicines that are toxic to your liver. Ask your health care provider before taking any new medicines, including over-the-counter medicines such as NSAIDs. Rest as needed. Eat a well-balanced diet. Limit your salt or water intake, if your health care provider asks you to do this. Do not drink alcohol. This is especially important if you routinely take acetaminophen. Keep all follow-up visits. This is important. Contact a health care provider if you: Have fatigue or weakness that is getting worse. Develop swelling of the hands, feet, or legs, or a buildup of fluid in the abdomen (ascites). Have a fever or chills. Develop loss of appetite. Have nausea or vomiting. Develop jaundice. Develop easy bruising or bleeding. Get help right away if you: Vomit bright red blood or a material that looks like coffee grounds. Have blood in your stools. Notice that your stools appear black and tarry. Become confused. Have chest pain or trouble breathing. These symptoms may represent a serious problem that is an emergency. Do not wait to see if the symptoms will go away. Get medical help right away. Call your local emergency services (911 in the [place] Do not drive yourself to the hospital. Summary Cirrhosis is chronic liver injury. Common causes are hepatitis C and long-term alcohol  abuse. Tests used to diagnose cirrhosis include blood tests, imaging tests, and liver biopsy. Treatment for this condition involves treating the underlying cause. Avoid alcohol, drugs, salt, and medicines that may damage your liver. Get help right away if you vomit bright red blood or a material that looks like coffee grounds. This information is not intended to replace advice given to you by your health care provider. Make sure you discuss any questions you have with your health care provider. Document Revised: 07/27/2020 Document Reviewed: 07/29/2020 Elsevier Patient Education  2024 [employer].

## 2023-06-08 ENCOUNTER — Ambulatory Visit
Admission: RE | Admit: 2023-06-08 | Discharge: 2023-06-08 | Disposition: A | Discharge status: Home / Self Care | Payer: 59 | Source: Ambulatory Visit | Attending: Surgery | Admitting: Surgery

## 2023-06-08 DIAGNOSIS — R188 Other ascites: Secondary | ICD-10-CM | POA: Insufficient documentation

## 2023-06-08 MED ORDER — LIDOCAINE HCL (PF) 1 % IJ SOLN
10.0000 mL | Freq: Once | INTRAMUSCULAR | Status: AC
  Administered 2023-06-08: 10 mL via INTRADERMAL
  Filled 2023-06-08: qty 10

## 2023-06-08 NOTE — Procedures (Signed)
PROCEDURE SUMMARY:  Successful image-guided paracentesis from the right lower abdomen.  Yielded 2.4 liters of yellow fluid.  No immediate complications.  EBL = trace. Patient tolerated well.   Specimen was not sent for labs.  Please see imaging section of Epic for full dictation.   Kennieth Francois PA-C 06/08/2023 2:47 PM

## 2023-06-14 ENCOUNTER — Telehealth: Payer: Self-pay | Admitting: *Deleted

## 2023-06-14 NOTE — Telephone Encounter (Signed)
The patients mother called and is very concern about the patients care. We referred him to see [facility] and they can not get him in until December. Patients mother is concern of what to do from now until December for the fluid in his abdomen. She is worried that this could come back and she would not know it. He did have the fluid drained out on 06/08/23. Please call and advise patient what they need to do until he gets in with GI.

## 2023-06-15 ENCOUNTER — Other Ambulatory Visit: Payer: Self-pay | Admitting: Family Medicine

## 2023-06-15 MED ORDER — SPIRONOLACTONE 100 MG PO TABS: 100.0000 mg | ORAL_TABLET | Freq: Every morning | ORAL | 0 refills | Status: DC

## 2023-06-15 NOTE — Telephone Encounter (Signed)
Copied from CRM [id number]. Topic: General - Other >> Jun 15, 2023  9:18 AM Dondra Prader A wrote: Reason for CRM: Pt mother Teryl Lucy states that the last time he seen his PCP she referred him to Dr. Everlene Farrier for his hernia. Dr. Everlene Farrier did not like his labs that he has been having and sent him to the hospital to have test done and with the follow up visit, pt has cirrhosis of the liver. Pt was sent to the hospital last Friday and 2.4 liters of fluid was take off of him. Dr. Everlene Farrier wanted the pt to go to a Gastroenterologist or liver specialists. Dr. Everlene Farrier is sending him to [facility] but they cannot see him until 10/04/2023. Dr. Everlene Farrier advised Maxine to ask pt PCP if pt need to increase his dosage of fluid pills that he is currently on or if his PCP need to prescribe him a new fluid pill that is stronger. Teryl Lucy states that she is a wreck and is needing help. Maxine would like to speak with PCP.

## 2023-06-19 ENCOUNTER — Other Ambulatory Visit: Payer: Self-pay

## 2023-06-19 ENCOUNTER — Ambulatory Visit: Payer: Self-pay

## 2023-06-19 ENCOUNTER — Inpatient Hospital Stay
Admission: EM | Admit: 2023-06-19 | Discharge: 2023-06-23 | DRG: 897 | Disposition: A | Discharge status: Home / Self Care | Payer: 59 | Attending: Internal Medicine | Admitting: Internal Medicine

## 2023-06-19 ENCOUNTER — Emergency Department: Payer: 59

## 2023-06-19 DIAGNOSIS — E871 Hypo-osmolality and hyponatremia: Secondary | ICD-10-CM | POA: Diagnosis not present

## 2023-06-19 DIAGNOSIS — E1140 Type 2 diabetes mellitus with diabetic neuropathy, unspecified: Secondary | ICD-10-CM | POA: Diagnosis not present

## 2023-06-19 DIAGNOSIS — H3552 Pigmentary retinal dystrophy: Secondary | ICD-10-CM | POA: Diagnosis present

## 2023-06-19 DIAGNOSIS — D649 Anemia, unspecified: Secondary | ICD-10-CM | POA: Diagnosis present

## 2023-06-19 DIAGNOSIS — F10951 Alcohol use, unspecified with alcohol-induced psychotic disorder with hallucinations: Secondary | ICD-10-CM | POA: Diagnosis present

## 2023-06-19 DIAGNOSIS — G8929 Other chronic pain: Secondary | ICD-10-CM | POA: Diagnosis not present

## 2023-06-19 DIAGNOSIS — I10 Essential (primary) hypertension: Secondary | ICD-10-CM | POA: Diagnosis not present

## 2023-06-19 DIAGNOSIS — D696 Thrombocytopenia, unspecified: Secondary | ICD-10-CM | POA: Diagnosis present

## 2023-06-19 DIAGNOSIS — Z91199 Patient's noncompliance with other medical treatment and regimen due to unspecified reason: Secondary | ICD-10-CM | POA: Diagnosis not present

## 2023-06-19 DIAGNOSIS — F17210 Nicotine dependence, cigarettes, uncomplicated: Secondary | ICD-10-CM | POA: Diagnosis not present

## 2023-06-19 DIAGNOSIS — F1021 Alcohol dependence, in remission: Secondary | ICD-10-CM | POA: Diagnosis present

## 2023-06-19 DIAGNOSIS — F411 Generalized anxiety disorder: Secondary | ICD-10-CM | POA: Diagnosis present

## 2023-06-19 DIAGNOSIS — K7031 Alcoholic cirrhosis of liver with ascites: Secondary | ICD-10-CM | POA: Diagnosis not present

## 2023-06-19 DIAGNOSIS — J320 Chronic maxillary sinusitis: Secondary | ICD-10-CM | POA: Diagnosis not present

## 2023-06-19 DIAGNOSIS — F10931 Alcohol use, unspecified with withdrawal delirium: Secondary | ICD-10-CM | POA: Diagnosis not present

## 2023-06-19 DIAGNOSIS — F10231 Alcohol dependence with withdrawal delirium: Principal | ICD-10-CM | POA: Diagnosis present

## 2023-06-19 DIAGNOSIS — J449 Chronic obstructive pulmonary disease, unspecified: Secondary | ICD-10-CM | POA: Diagnosis not present

## 2023-06-19 DIAGNOSIS — E785 Hyperlipidemia, unspecified: Secondary | ICD-10-CM | POA: Diagnosis not present

## 2023-06-19 DIAGNOSIS — E876 Hypokalemia: Secondary | ICD-10-CM | POA: Diagnosis present

## 2023-06-19 DIAGNOSIS — H548 Legal blindness, as defined in USA: Secondary | ICD-10-CM | POA: Diagnosis not present

## 2023-06-19 DIAGNOSIS — Z811 Family history of alcohol abuse and dependence: Secondary | ICD-10-CM

## 2023-06-19 DIAGNOSIS — I700 Atherosclerosis of aorta: Secondary | ICD-10-CM | POA: Diagnosis present

## 2023-06-19 DIAGNOSIS — R410 Disorientation, unspecified: Secondary | ICD-10-CM

## 2023-06-19 DIAGNOSIS — F1020 Alcohol dependence, uncomplicated: Secondary | ICD-10-CM | POA: Diagnosis present

## 2023-06-19 DIAGNOSIS — G4733 Obstructive sleep apnea (adult) (pediatric): Secondary | ICD-10-CM | POA: Diagnosis not present

## 2023-06-19 DIAGNOSIS — R4182 Altered mental status, unspecified: Secondary | ICD-10-CM | POA: Diagnosis present

## 2023-06-19 DIAGNOSIS — Z833 Family history of diabetes mellitus: Secondary | ICD-10-CM

## 2023-06-19 DIAGNOSIS — M545 Low back pain, unspecified: Secondary | ICD-10-CM | POA: Diagnosis present

## 2023-06-19 DIAGNOSIS — M109 Gout, unspecified: Secondary | ICD-10-CM | POA: Diagnosis not present

## 2023-06-19 DIAGNOSIS — J341 Cyst and mucocele of nose and nasal sinus: Secondary | ICD-10-CM | POA: Diagnosis not present

## 2023-06-19 DIAGNOSIS — E663 Overweight: Secondary | ICD-10-CM | POA: Insufficient documentation

## 2023-06-19 DIAGNOSIS — Z79899 Other long term (current) drug therapy: Secondary | ICD-10-CM

## 2023-06-19 DIAGNOSIS — H547 Unspecified visual loss: Secondary | ICD-10-CM | POA: Diagnosis not present

## 2023-06-19 LAB — CBC
HCT: 34.7 % — ABNORMAL LOW (ref 39.0–52.0)
Hemoglobin: 11.7 g/dL — ABNORMAL LOW (ref 13.0–17.0)
MCH: 31.2 pg (ref 26.0–34.0)
MCHC: 33.7 g/dL (ref 30.0–36.0)
MCV: 92.5 fL (ref 80.0–100.0)
Platelets: 81 10*3/uL — ABNORMAL LOW (ref 150–400)
RBC: 3.75 MIL/uL — ABNORMAL LOW (ref 4.22–5.81)
RDW: 14.3 % (ref 11.5–15.5)
WBC: 7 10*3/uL (ref 4.0–10.5)
nRBC: 0 % (ref 0.0–0.2)

## 2023-06-19 LAB — COMPREHENSIVE METABOLIC PANEL
ALT: 16 U/L (ref 0–44)
AST: 60 U/L — ABNORMAL HIGH (ref 15–41)
Albumin: 3.4 g/dL — ABNORMAL LOW (ref 3.5–5.0)
Alkaline Phosphatase: 109 U/L (ref 38–126)
Anion gap: 15 (ref 5–15)
BUN: 12 mg/dL (ref 8–23)
CO2: 23 mmol/L (ref 22–32)
Calcium: 8.3 mg/dL — ABNORMAL LOW (ref 8.9–10.3)
Chloride: 94 mmol/L — ABNORMAL LOW (ref 98–111)
Creatinine, Ser: 0.6 mg/dL — ABNORMAL LOW (ref 0.61–1.24)
GFR, Estimated: >60 mL/min (ref >60)
Glucose, Bld: 134 mg/dL — ABNORMAL HIGH (ref 70–99)
Potassium: 2.8 mmol/L — ABNORMAL LOW (ref 3.5–5.1)
Sodium: 132 mmol/L — ABNORMAL LOW (ref 135–145)
Total Bilirubin: 3 mg/dL — ABNORMAL HIGH (ref 0.3–1.2)
Total Protein: 7.9 g/dL (ref 6.5–8.1)

## 2023-06-19 LAB — PROTIME-INR
INR: 1.3 — ABNORMAL HIGH (ref 0.8–1.2)
Prothrombin Time: 16.7 seconds — ABNORMAL HIGH (ref 11.4–15.2)

## 2023-06-19 LAB — AMMONIA: Ammonia: 41 umol/L — ABNORMAL HIGH (ref 9–35)

## 2023-06-19 LAB — ETHANOL: Alcohol, Ethyl (B): 11 mg/dL — ABNORMAL HIGH (ref <10)

## 2023-06-19 LAB — SALICYLATE LEVEL: Salicylate Lvl: <7 mg/dL — ABNORMAL LOW (ref 7.0–30.0)

## 2023-06-19 LAB — ACETAMINOPHEN LEVEL: Acetaminophen (Tylenol), Serum: <10 ug/mL — ABNORMAL LOW (ref 10–30)

## 2023-06-19 MED ORDER — CHLORDIAZEPOXIDE HCL 5 MG PO CAPS
5.0000 mg | ORAL_CAPSULE | Freq: Four times a day (QID) | ORAL | Status: DC
  Administered 2023-06-20 (×3): 5 mg via ORAL
  Filled 2023-06-19 (×3): qty 1

## 2023-06-19 MED ORDER — MORPHINE SULFATE (PF) 2 MG/ML IV SOLN: 2.0000 mg | INTRAVENOUS | Status: AC | PRN

## 2023-06-19 MED ORDER — LORAZEPAM 1 MG PO TABS
1.0000 mg | ORAL_TABLET | Freq: Four times a day (QID) | ORAL | Status: AC
  Administered 2023-06-20 – 2023-06-21 (×3): 1 mg via ORAL
  Filled 2023-06-19 (×8): qty 1

## 2023-06-19 MED ORDER — LORAZEPAM 2 MG PO TABS: 0.0000 mg | ORAL_TABLET | Freq: Two times a day (BID) | ORAL | Status: DC

## 2023-06-19 MED ORDER — LACTULOSE 10 GM/15ML PO SOLN
20.0000 g | Freq: Once | ORAL | Status: AC
  Administered 2023-06-20: 20 g via ORAL
  Filled 2023-06-19: qty 30

## 2023-06-19 MED ORDER — LORAZEPAM 1 MG PO TABS
1.0000 mg | ORAL_TABLET | Freq: Two times a day (BID) | ORAL | Status: DC
  Administered 2023-06-22 (×2): 1 mg via ORAL
  Filled 2023-06-19 (×2): qty 1

## 2023-06-19 MED ORDER — THIAMINE MONONITRATE 100 MG PO TABS: 100.0000 mg | ORAL_TABLET | Freq: Every day | ORAL | Status: DC

## 2023-06-19 MED ORDER — THIAMINE HCL 100 MG/ML IJ SOLN: 100.0000 mg | Freq: Every day | INTRAMUSCULAR | Status: DC

## 2023-06-19 MED ORDER — DIAZEPAM 5 MG/ML IJ SOLN
10.0000 mg | Freq: Three times a day (TID) | INTRAMUSCULAR | Status: AC
  Administered 2023-06-20 – 2023-06-21 (×6): 10 mg via INTRAVENOUS
  Filled 2023-06-19 (×6): qty 2

## 2023-06-19 MED ORDER — LORAZEPAM 2 MG PO TABS
0.0000 mg | ORAL_TABLET | Freq: Four times a day (QID) | ORAL | Status: AC
  Administered 2023-06-19: 3 mg via ORAL
  Administered 2023-06-20: 2 mg via ORAL
  Administered 2023-06-20: 3 mg via ORAL
  Administered 2023-06-21 (×2): 2 mg via ORAL
  Filled 2023-06-19 (×2): qty 2

## 2023-06-19 MED ORDER — THIAMINE HCL 100 MG/ML IJ SOLN
500.0000 mg | Freq: Once | INTRAVENOUS | Status: AC
  Administered 2023-06-20: 500 mg via INTRAVENOUS
  Filled 2023-06-19: qty 5

## 2023-06-19 MED ORDER — SODIUM CHLORIDE 0.9% FLUSH
3.0000 mL | Freq: Two times a day (BID) | INTRAVENOUS | Status: DC
  Administered 2023-06-20 – 2023-06-23 (×8): 3 mL via INTRAVENOUS

## 2023-06-19 NOTE — Assessment & Plan Note (Signed)
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 SCDs.  Obtain a PT/INR and follow-up.

## 2023-06-19 NOTE — Assessment & Plan Note (Signed)
Patient has a history of alcoholism and alcohol withdrawal. TTS consult once patient is stable.

## 2023-06-19 NOTE — Assessment & Plan Note (Signed)
Currently patient will be managed on lorazepam as needed.

## 2023-06-19 NOTE — Hospital Course (Addendum)
Joel Bryant is a 62 y.o. male with medical history significant for alcohol abuse, anxiety, hypertension, sleep apnea, diabetes mellitus type 2 that is controlled, presenting with hallucinations. At admission, patient vital signs are stable, he had a significant hyponatremia, hypokalemia and hypomagnesemia.  He is also treated for alcohol withdrawal. Patient condition had improved, they wish to go home.  Obtain oxygen evaluation, patient appeared to have COPD.

## 2023-06-19 NOTE — ED Triage Notes (Signed)
Pt presents to ER with c/o hallucinations that have been going on since yesterday, but have been getting more frequent and vivid.  Pt states "I've been seeing demons from hell in my house, and they have been trying to kill me."  Pt also reports being alcoholic, and had been previously drinking 15 beers/day, but had only had 5 in the last week.  States he has been trying to stop drinking d/t dx of cirrhosis.  Denies any drug use, or SI/HI.  Pt is otherwise A&O x4 and in NAD at this time.

## 2023-06-19 NOTE — H&P (Incomplete)
History and Physical    Patient: Joel Bryant NWG:[id number] DOB: [birth year] DOA: 06/19/2023 DOS: the patient was seen and examined on 06/20/2023 PCP: Alba Cory, MD  Patient coming from: Home   Chief Complaint:  Chief Complaint  Patient presents with   Hallucinations    HPI: Joel Bryant is a 62 y.o. male with medical history significant for alcohol abuse, anxiety, hypertension, sleep apnea, diabetes mellitus type 2 that is controlled, presenting with hallucinations.  About 15 beers per day admit fluid coming down because of his cirrhosis.   In the emergency room patient is alert awake oriented cooperative. Vitals show patient is afebrile at 98.5 heart rate of 78 blood pressure 150/87 O2 sats of 98% on room air. Metabolic panel shows mild hyponatremia at 132 hypokalemia of 2.8 glucose 134 creatinine 0.60 calcium 8.3 AST of 60 ALT of 16 total bili of 3.0 ammonia 41 Head CT done today is negative for any acute intracranial abnormality.  Review of Systems: Review of Systems  Psychiatric/Behavioral:  Positive for hallucinations. Negative for suicidal ideas. The patient is nervous/anxious.   All other systems reviewed and are negative.  Past Medical History:  Diagnosis Date   Anxiety    Aortic atherosclerosis (HCC) 06/13/2018   July 2019 CT scan   Chronic LBP 09/13/2015   Chronic left hip pain 09/27/2015   Diabetes mellitus without complication (HCC)    Fracture, vertebra, pathologic    Gout    Hyperlipidemia    Hypertension    Hypomagnesemia    Retinitis pigmentosa    Sleep apnea    Past Surgical History:  Procedure Laterality Date   BACK SURGERY  2001   laminectomy lumbar spine   CATARACT EXTRACTION  1992   Insert Prosthetic Lens   COLONOSCOPY WITH PROPOFOL N/A 02/28/2017   Procedure: COLONOSCOPY WITH PROPOFOL;  Surgeon: Scot Jun, MD;  Location: [facility] ENDOSCOPY;  Service: Endoscopy;  Laterality: N/A;   COLONOSCOPY WITH PROPOFOL N/A 06/03/2020   Procedure:  COLONOSCOPY WITH PROPOFOL;  Surgeon: Regis Bill, MD;  Location: [facility] ENDOSCOPY;  Service: Endoscopy;  Laterality: N/A;   EYE SURGERY     KYPHOPLASTY N/A 08/08/2018   Procedure: [id number];  Surgeon: Kennedy Bucker, MD;  Location: [facility] ORS;  Service: Orthopedics;  Laterality: N/A;   LAMINECTOMY  2001   lumbar   MULTIPLE TOOTH EXTRACTIONS     2018   TIBIA FRACTURE SURGERY     TONSILLECTOMY     WRIST SURGERY Right    x2   Social History:   reports that he has been smoking cigarettes. He has a 26.3 pack-year smoking history. He has never used smokeless tobacco. He reports current alcohol use. He reports that he does not currently use drugs after having used the following drugs: Marijuana, "Crack" cocaine, Cocaine, and Other-see comments.  No Known Allergies  Family History  Problem Relation Age of Onset   Colon polyps Mother    Retinitis pigmentosa Mother    Retinitis pigmentosa Maternal Grandmother    Cancer Maternal Grandfather    Diabetes Paternal Grandmother    Alcohol abuse Father     Prior to Admission medications   Medication Sig Start Date End Date Taking? Authorizing Provider  atenolol (TENORMIN) 50 MG tablet TAKE ONE TABLET BY MOUTH DAILY 01/31/23  Yes Sowles, Danna Hefty, MD  atorvastatin (LIPITOR) 20 MG tablet TAKE ONE TABLET BY MOUTH ONCE A DAY AT SIX IN THE EVENING 01/31/23  Yes Sowles, Danna Hefty, MD  Cyanocobalamin (B-12) 1000 MCG SUBL  Place 1 tablet under the tongue daily. 02/13/22  Yes Sowles, Danna Hefty, MD  esomeprazole (NEXIUM) 40 MG capsule TAKE ONE CAPSULE BY MOUTH DAILY 01/31/23  Yes Sowles, Danna Hefty, MD  furosemide (LASIX) 40 MG tablet TAKE ONE TABLET BY MOUTH ONCE A DAY AS NEEDED 06/04/23  Yes Sowles, Danna Hefty, MD  lisinopril (ZESTRIL) 5 MG tablet TAKE ONE TABLET BY MOUTH DAILY 01/31/23  Yes Sowles, Danna Hefty, MD  meclizine (ANTIVERT) 25 MG tablet Take 0.5-1 tablets (12.5-25 mg total) by mouth 3 (three) times daily as needed for dizziness. 03/09/23  Yes Danelle Berry, PA-C   omega-3 acid ethyl esters (LOVAZA) 1 g capsule TAKE TWO CAPSULES BY MOUTH TWO TIMES DAILY 01/31/23  Yes Sowles, Danna Hefty, MD  spironolactone (ALDACTONE) 100 MG tablet Take 1 tablet (100 mg total) by mouth in the morning. 06/15/23  Yes Sowles, Danna Hefty, MD  colchicine 0.6 MG tablet TAKE 1 TABLET BY MOUTH TWICE DAILY AS NEEDED. TAKE ONE AND MAY REPEAT A SECONDDOSE IN 2 HOURS, MAY STOP WHEN PAIN RESOLVES 11/01/21   Margarita Mail, DO     Vitals:   06/19/23 2243 06/19/23 2330 06/20/23 0000 06/20/23 0030  BP:  131/83 (!) 143/90 132/81  Pulse:  71 71 77  Resp:    (!) 23  Temp:      TempSrc:      SpO2:  99% 100% 96%  Weight: 104.5 kg      Physical Exam Vitals and nursing note reviewed.  Constitutional:      General: He is not in acute distress. HENT:     Head: Normocephalic and atraumatic.     Right Ear: Hearing normal.     Left Ear: Hearing normal.     Nose: Nose normal. No nasal deformity.     Mouth/Throat:     Lips: Pink.     Tongue: No lesions.     Pharynx: Oropharynx is clear.  Eyes:     General: Lids are normal.     Extraocular Movements: Extraocular movements intact.  Cardiovascular:     Rate and Rhythm: Normal rate and regular rhythm.     Heart sounds: Normal heart sounds.  Pulmonary:     Effort: Pulmonary effort is normal.     Breath sounds: Normal breath sounds.  Abdominal:     General: Bowel sounds are normal. There is no distension.     Palpations: Abdomen is soft. There is no mass.     Tenderness: There is no abdominal tenderness.  Musculoskeletal:     Right lower leg: No edema.     Left lower leg: No edema.  Skin:    General: Skin is warm.  Neurological:     General: No focal deficit present.     Mental Status: He is alert and oriented to person, place, and time.     Cranial Nerves: Cranial nerves 2-12 are intact.  Psychiatric:        Attention and Perception: Attention normal.        Mood and Affect: Mood normal.        Speech: Speech normal.         Behavior: Behavior normal. Behavior is cooperative.      Labs on Admission: I have personally reviewed following labs and imaging studies  CBC: Recent Labs  Lab 06/19/23 2124  WBC 7.0  HGB 11.7*  HCT 34.7*  MCV 92.5  PLT 81*   Basic Metabolic Panel: Recent Labs  Lab 06/19/23 2124  NA 132*  K 2.8*  CL  94*  CO2 23  GLUCOSE 134*  BUN 12  CREATININE 0.60*  CALCIUM 8.3*   GFR: Estimated Creatinine Clearance: 110.7 mL/min (A) (by C-G formula based on SCr of 0.6 mg/dL (L)). Liver Function Tests: Recent Labs  Lab 06/19/23 2124  AST 60*  ALT 16  ALKPHOS 109  BILITOT 3.0*  PROT 7.9  ALBUMIN 3.4*   No results for input(s): "LIPASE", "AMYLASE" in the last 168 hours. Recent Labs  Lab 06/19/23 2208  AMMONIA 41*   Coagulation Profile: Recent Labs  Lab 06/19/23 2208  INR 1.3*   Cardiac Enzymes: No results for input(s): "CKTOTAL", "CKMB", "CKMBINDEX", "TROPONINI" in the last 168 hours. BNP (last 3 results) No results for input(s): "PROBNP" in the last 8760 hours. HbA1C: No results for input(s): "HGBA1C" in the last 72 hours. CBG: No results for input(s): "GLUCAP" in the last 168 hours. Lipid Profile: No results for input(s): "CHOL", "HDL", "LDLCALC", "TRIG", "CHOLHDL", "LDLDIRECT" in the last 72 hours. Thyroid Function Tests: No results for input(s): "TSH", "T4TOTAL", "FREET4", "T3FREE", "THYROIDAB" in the last 72 hours. Anemia Panel: No results for input(s): "VITAMINB12", "FOLATE", "FERRITIN", "TIBC", "IRON", "RETICCTPCT" in the last 72 hours. Urinalysis    Component Value Date/Time   COLORURINE YELLOW (A) 05/22/2018 0849   APPEARANCEUR CLEAR (A) 05/22/2018 0849   APPEARANCEUR Clear 11/18/2011 2154   LABSPEC 1.006 05/22/2018 0849   LABSPEC 1.002 11/18/2011 2154   PHURINE 6.0 05/22/2018 0849   GLUCOSEU NEGATIVE 05/22/2018 0849   GLUCOSEU Negative 11/18/2011 2154   HGBUR NEGATIVE 05/22/2018 0849   BILIRUBINUR NEGATIVE 05/22/2018 0849   BILIRUBINUR  Negative 11/18/2011 2154   KETONESUR NEGATIVE 05/22/2018 0849   PROTEINUR NEGATIVE 05/22/2018 0849   NITRITE NEGATIVE 05/22/2018 0849   LEUKOCYTESUR NEGATIVE 05/22/2018 0849   LEUKOCYTESUR Negative 11/18/2011 2154   Unresulted Labs (From admission, onward)     Start     Ordered   06/20/23 0500  Comprehensive metabolic panel  Tomorrow morning,   R        06/19/23 2354   06/20/23 0500  CBC  Tomorrow morning,   R        06/19/23 2354   06/20/23 0500  Phosphorus  Tomorrow morning,   R        06/20/23 0124   06/20/23 0500  Magnesium  Tomorrow morning,   R        06/20/23 0124   06/19/23 2126  Urine Drug Screen, Qualitative  Once,   URGENT        06/19/23 2126            Medications  LORazepam (ATIVAN) tablet 1 mg ( Oral See Alternative 06/19/23 2232)    Or  LORazepam (ATIVAN) tablet 0-4 mg (3 mg Oral Given 06/19/23 2232)  LORazepam (ATIVAN) tablet 1 mg (has no administration in time range)    Or  LORazepam (ATIVAN) tablet 0-4 mg (has no administration in time range)  diazepam (VALIUM) injection 10 mg (10 mg Intravenous Given 06/20/23 0015)  chlordiazePOXIDE (LIBRIUM) capsule 5 mg (5 mg Oral Given 06/20/23 0015)  sodium chloride flush (NS) 0.9 % injection 3 mL (3 mLs Intravenous Given 06/20/23 0017)  morphine (PF) 2 MG/ML injection 2 mg (has no administration in time range)  thiamine (VITAMIN B1) 500 mg in sodium chloride 0.9 % 50 mL IVPB (has no administration in time range)  potassium chloride 10 mEq in 100 mL IVPB (10 mEq Intravenous New Bag/Given 06/20/23 0156)  furosemide (LASIX) injection 20 mg (20 mg Intravenous  Given 06/20/23 0144)  pantoprazole (PROTONIX) injection 40 mg (40 mg Intravenous Given 06/20/23 0142)  spironolactone (ALDACTONE) tablet 25 mg (25 mg Oral Given 06/20/23 0155)  insulin aspart (novoLOG) injection 0-6 Units (has no administration in time range)  lactulose (CHRONULAC) 10 GM/15ML solution 20 g (20 g Oral Given 06/20/23 0015)  thiamine (VITAMIN B1) 500 mg in  sodium chloride 0.9 % 50 mL IVPB (0 mg Intravenous Stopped 06/20/23 0105)    Radiological Exams on Admission: CT HEAD WO CONTRAST ( )  Result Date: 06/19/2023 CLINICAL DATA:  Mental status change, unknown cause EXAM: CT HEAD WITHOUT CONTRAST TECHNIQUE: Contiguous axial images were obtained from the base of the skull through the vertex without intravenous contrast. RADIATION DOSE REDUCTION: This exam was performed according to the departmental dose-optimization program which includes automated exposure control, adjustment of the mA and/or kV according to patient size and/or use of iterative reconstruction technique. COMPARISON:  CT head 03/13/2023. FINDINGS: Brain: No evidence of large-territorial acute infarction. No parenchymal hemorrhage. No mass lesion. No extra-axial collection. No mass effect or midline shift. No hydrocephalus. Basilar cisterns are patent. Vascular: No hyperdense vessel. Skull: No acute fracture or focal lesion. Sinuses/Orbits: Paranasal sinuses and mastoid air cells are clear. Bilateral lens replacement. Otherwise the orbits are unremarkable. Other: None. IMPRESSION: No acute intracranial abnormality. Electronically Signed   By: Tish Frederickson M.D.   On: 06/19/2023 23:08     Data Reviewed: Relevant notes from primary care and specialist visits, past discharge summaries as available in EHR, including Care Everywhere. Prior diagnostic testing as pertinent to current admission diagnoses Updated medications and problem lists for reconciliation ED course, including vitals, labs, imaging, treatment and response to treatment Triage notes, nursing and pharmacy notes and ED provider's notes Notable results as noted in HPI  Assessment and Plan: * AMS (altered mental status) Suspect wernicke , pt presenting with delirium hallucination and has nystagmus on exam . Pt given thiamine 500 mg stat we will also get MRI brain non contrast  stat to rule out any other Neuro pathology I.e  stroke or cancer.  Patient to stepdown unit withdrawal protocol and close monitoring. Every hour neurochecks.   Anemia    Latest Ref Rng & Units 06/19/2023    9:24 PM 05/30/2023    3:40 PM 05/16/2023   11:15 AM  CBC  WBC 4.0 - 10.5 K/uL 7.0  5.5  7.5   Hemoglobin 13.0 - 17.0 g/dL 66.0  63.0  16.0   Hematocrit 39.0 - 52.0 % 34.7  35.0  38.2   Platelets 150 - 400 K/uL 81  61  87   Pt has  c/h anemia along with thrombocytopenia.  We will monitor and follow.  IV PPI.  Type/screen/ transfuse as indicated.     Hypokalemia    Latest Ref Rng & Units 06/19/2023    9:24 PM 05/30/2023    3:40 PM 05/16/2023   11:15 AM  BMP  Glucose 70 - 99 mg/dL 109  323  99   BUN 8 - 23 mg/dL 12  7  7    Creatinine 0.61 - 1.24 mg/dL 5.57  3.22  0.25   BUN/Creat Ratio 6 - 22 (calc)   12   Sodium 135 - 145 mmol/L 132  126  133   Potassium 3.5 - 5.1 mmol/L 2.8  3.6  3.7   Chloride 98 - 111 mmol/L 94  93  97   CO2 22 - 32 mmol/L 23  24  27    Calcium  8.9 - 10.3 mg/dL 8.3  8.1  9.0   Will replace and follow.   potassium chloride     thiamine (VITAMIN B1) injection        Alcoholic cirrhosis of liver with ascites (HCC) Pt had USG guided paracentesis on  06/06/2023 Pt referred to GI for plan for TIPPS. Dr. Everlene Farrier is sending him to [facility] but they cannot see him until 10/04/2023.  Pt has ascites today and abd is soft , protuberant as well.  Will give lasix low dose and monitor.    Acute alcoholic hallucinosis (HCC) Patient presenting with visual and auditory hallucinations and per nurses note patient said : "He saw demons in the extra bedroom packing up his belonging." "Saw little dogs all around him""saw two men on motorcycles who came in house and barged past him to his kitchen." "He said he saw a tractor in the window"  Patient's symptoms have started over the last 24 hours and drinks about 15 beers per day and has only had 5 over the past week.  No drug use or suicidal or homicidal ideation and is  otherwise alert and oriented in the emergency room. Will admit patient to stepdown unit with CIWA protocol and alcohol withdrawal protocol.  Will monitor patient in the event the patient needs additional measures or Precedex drip. Thiamine 500 mg IV stat. CIWA protocol stat. Aspiration, fall, seizure precautions. Scheduled librium for 2 days.   Alcoholism [facility]) Patient has a history of alcoholism and alcohol withdrawal. TTS consult once patient is stable.  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 SCDs.  Obtain a PT/INR and follow-up.  GAD (generalized anxiety disorder) Currently patient will be managed on lorazepam as needed.  Obstructive apnea Cpap not ordered as pt is confused. Resume once stable.    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  Fall precaution and visually impaired will need assistance with commode and personal care.    Controlled type 2 diabetes mellitus with diabetic neuropathy, without long-term current use of insulin (HCC) Currently n.p.o., glycemic protocol with Accu-Cheks every 4 hourly.    DVT prophylaxis:  scd's  Consults:  None   Advance Care Planning:    Code Status: Full Code   Family Communication:  None   Disposition Plan:  TBD  Severity of Illness: The appropriate patient status for this patient is INPATIENT. Inpatient status is judged to be reasonable and necessary in order to provide the required intensity of service to ensure the patient's safety. The patient's presenting symptoms, physical exam findings, and initial radiographic and laboratory data in the context of their chronic comorbidities is felt to place them at high risk for further clinical deterioration. Furthermore, it is not anticipated that the patient will be medically stable for discharge from the hospital within 2 midnights of admission.   * I certify that at the point of admission  it is my clinical judgment that the patient will require inpatient hospital care spanning beyond 2 midnights from the point of admission due to high intensity of service, high risk for further deterioration and high frequency of surveillance required.*  Author: Gertha Calkin, MD 06/20/2023 1:59 AM  For on call review [email or web address].

## 2023-06-19 NOTE — ED Triage Notes (Signed)
EMS brings pt in from home for c/o hallucinations for last 24hrs; 15/day beers, now to "only 5 in the past week";

## 2023-06-19 NOTE — Assessment & Plan Note (Signed)
Currently n.p.o., glycemic protocol with Accu-Cheks every 4 hourly.

## 2023-06-19 NOTE — Telephone Encounter (Signed)
  Chief Complaint: t s mother not with pt- advised to call Sherron Monday who is with pt. Called Ms Toni Arthurs and she reported pt is asleep. She reported pt having visual and auditory hallucinations. "He saw demons in the extra bedroom packing up his belonging." "Saw little dogs all around him""saw two men on motorcycles who came in house and barged past him to his kitchen." "He said he saw a tractor in the window"  Advised will need emergent care. Called Pt's mother and advised that pt need to go to ED now. Advised mother to call 911 and let them discuss what to do if he refuses to go. And to determine if he is sound on mind to make decisions. He is aware of being at home but since asleep dud not wake him to make sure he knew per, place or time. Mother began crying and very upset. Emotional support given and stayed on phone with her until she was calm.  Symptoms: see above, also poor sleep  Frequency: today  Pertinent Negatives: Patient denies Pt was asleep Disposition: [x] ED /[] Urgent Care (no appt availability in office) / [] Appointment(In office/virtual)/ []  [place] [facility]/ [] Home Care/ [] Refused Recommended Disposition /[] [place] [facility]/ []  Follow-up with PCP Additional Notes: - PReason for Disposition  RN needs further essential information from caller in order to complete triage  Protocols used: Information Only Call - No Triage-A-AH

## 2023-06-19 NOTE — Assessment & Plan Note (Signed)
Patient presenting with visual and auditory hallucinations and per nurses note patient said : "He saw demons in the extra bedroom packing up his belonging." "Saw little dogs all around him""saw two men on motorcycles who came in house and barged past him to his kitchen." "He said he saw a tractor in the window"  Patient's symptoms have started over the last 24 hours and drinks about 15 beers per day and has only had 5 over the past week.  No drug use or suicidal or homicidal ideation and is otherwise alert and oriented in the emergency room. Will admit patient to stepdown unit with CIWA protocol and alcohol withdrawal protocol.  Will monitor patient in the event the patient needs additional measures or Precedex drip. Thiamine 500 mg IV stat. CIWA protocol stat. Aspiration, fall, seizure precautions. Scheduled librium for 2 days.

## 2023-06-19 NOTE — ED Provider Notes (Signed)
[facility] Provider Note    Event Date/Time   First MD Initiated Contact with Patient 06/19/23 2212     (approximate)   History   Hallucinations   HPI  Joel Bryant is a 62 y.o. male history of alcoholic cirrhosis presents to the ER for evaluation of hallucinations.  Patient feels like he is losing his mind and and on arrival to the ER was stating that the triage nurse was pushing him out in front of a train.  He denies any pain denies any falls.  States he did recently stop drinking because of his cirrhosis.     Physical Exam   Triage Vital Signs: ED Triage Vitals  Encounter Vitals Group     BP 06/19/23 2125 (!) 150/87     Systolic BP Percentile --      Diastolic BP Percentile --      Pulse Rate 06/19/23 2125 78     Resp 06/19/23 2125 16     Temp 06/19/23 2125 98.5 F (36.9 C)     Temp Source 06/19/23 2125 Oral     SpO2 06/19/23 2113 99 %     Weight --      Height --      Head Circumference --      Peak Flow --      Pain Score 06/19/23 2125 0     Pain Loc --      Pain Education --      Exclude from Growth Chart --     Most recent vital signs: Vitals:   06/19/23 2125 06/19/23 2230  BP: (!) 150/87 (!) 147/93  Pulse: 78 72  Resp: 16 (!) 22  Temp: 98.5 F (36.9 C)   SpO2: 98% 97%     Constitutional: Alert  Eyes: Conjunctivae are normal.  Head: Atraumatic. Nose: No congestion/rhinnorhea. Mouth/Throat: Mucous membranes are moist.   Neck: Painless ROM.  Cardiovascular:   Good peripheral circulation. Respiratory: Normal respiratory effort.  No retractions.  Gastrointestinal: Soft and nontender.  Musculoskeletal:  no deformity Neurologic:  MAE spontaneously. No gross focal neurologic deficits are appreciated. + asterixis, mild tongue facsciculations Skin:  Skin is warm, dry and intact. No rash noted. Psychiatric: Mood and affect are normal. Speech and behavior are normal.    ED Results / Procedures / Treatments    Labs (all labs ordered are listed, but only abnormal results are displayed) Labs Reviewed  COMPREHENSIVE METABOLIC PANEL - Abnormal; Notable for the following components:      Result Value   Sodium 132 (*)    Potassium 2.8 (*)    Chloride 94 (*)    Glucose, Bld 134 (*)    Creatinine, Ser 0.60 (*)    Calcium 8.3 (*)    Albumin 3.4 (*)    AST 60 (*)    Total Bilirubin 3.0 (*)    All other components within normal limits  ETHANOL - Abnormal; Notable for the following components:   Alcohol, Ethyl (B) 11 (*)    All other components within normal limits  SALICYLATE LEVEL - Abnormal; Notable for the following components:   Salicylate Lvl <7.0 (*)    All other components within normal limits  ACETAMINOPHEN LEVEL - Abnormal; Notable for the following components:   Acetaminophen (Tylenol), Serum <10 (*)    All other components within normal limits  CBC - Abnormal; Notable for the following components:   RBC 3.75 (*)    Hemoglobin 11.7 (*)  HCT 34.7 (*)    Platelets 81 (*)    All other components within normal limits  AMMONIA - Abnormal; Notable for the following components:   Ammonia 41 (*)    All other components within normal limits  PROTIME-INR - Abnormal; Notable for the following components:   Prothrombin Time 16.7 (*)    INR 1.3 (*)    All other components within normal limits  URINE DRUG SCREEN, QUALITATIVE ([facility] ONLY)     EKG  ED ECG REPORT I, Willy Eddy, the attending physician, personally viewed and interpreted this ECG.   Date: 06/19/2023  EKG Time: 22:29  Rate: 75  Rhythm: sinus  Axis: normal  Intervals: normal  ST&T Change: no stemi, no depressions    RADIOLOGY Please see ED Course for my review and interpretation.  I personally reviewed all radiographic images ordered to evaluate for the above acute complaints and reviewed radiology reports and findings.  These findings were personally discussed with the patient.  Please see medical record  for radiology report.    PROCEDURES:  Critical Care performed: No  Procedures   MEDICATIONS ORDERED IN ED: Medications  LORazepam (ATIVAN) tablet 1 mg ( Oral See Alternative 06/19/23 2232)    Or  LORazepam (ATIVAN) tablet 0-4 mg (3 mg Oral Given 06/19/23 2232)  LORazepam (ATIVAN) tablet 1 mg (has no administration in time range)    Or  LORazepam (ATIVAN) tablet 0-4 mg (has no administration in time range)  thiamine (VITAMIN B1) tablet 100 mg (has no administration in time range)    Or  thiamine (VITAMIN B1) injection 100 mg (has no administration in time range)  lactulose (CHRONULAC) 10 GM/15ML solution 20 g (has no administration in time range)     IMPRESSION / MDM / ASSESSMENT AND PLAN / ED COURSE  I reviewed the triage vital signs and the nursing notes.                              Differential diagnosis includes, but is not limited to, Dehydration, sepsis, pna, uti, hypoglycemia, cva, drug effect, withdrawal, encephalitis  Patient presenting to the ER for evaluation of symptoms as described above.  Based on symptoms, risk factors and considered above differential, this presenting complaint could reflect a potentially life-threatening illness therefore the patient will be placed on continuous pulse oximetry and telemetry for monitoring.  Laboratory evaluation will be sent to evaluate for the above complaints.      Clinical Course as of 06/19/23 2254  Tue Jun 19, 2023  2244 Ammonia level is elevated. [PR]  2251 He had on my review and interpretation does not show any evidence of bleed or mass.  Presentation could be explained by hepatic encephalopathy but given history of recent significant decrease in alcohol intake over the weekend I suspect he is having moderate alcohol withdrawal symptoms.  Have improved with Ativan but based on his presentation comorbidities will consult hospitalist for admission. [PR]    Clinical Course User Index [PR] Willy Eddy, MD      FINAL CLINICAL IMPRESSION(S) / ED DIAGNOSES   Final diagnoses:  Alcohol withdrawal delirium (HCC)     Rx / DC Orders   ED Discharge Orders     None        Note:  This document was prepared using Dragon voice recognition software and may include unintentional dictation errors.    Willy Eddy, MD 06/19/23 2255

## 2023-06-20 ENCOUNTER — Inpatient Hospital Stay: Payer: 59

## 2023-06-20 ENCOUNTER — Encounter: Payer: Self-pay | Admitting: Internal Medicine

## 2023-06-20 DIAGNOSIS — E876 Hypokalemia: Secondary | ICD-10-CM | POA: Diagnosis not present

## 2023-06-20 DIAGNOSIS — E871 Hypo-osmolality and hyponatremia: Secondary | ICD-10-CM | POA: Insufficient documentation

## 2023-06-20 DIAGNOSIS — K7031 Alcoholic cirrhosis of liver with ascites: Secondary | ICD-10-CM | POA: Diagnosis present

## 2023-06-20 DIAGNOSIS — D649 Anemia, unspecified: Secondary | ICD-10-CM | POA: Diagnosis present

## 2023-06-20 DIAGNOSIS — F10951 Alcohol use, unspecified with alcohol-induced psychotic disorder with hallucinations: Secondary | ICD-10-CM | POA: Diagnosis not present

## 2023-06-20 LAB — COMPREHENSIVE METABOLIC PANEL
ALT: 15 U/L (ref 0–44)
AST: 56 U/L — ABNORMAL HIGH (ref 15–41)
Albumin: 3.3 g/dL — ABNORMAL LOW (ref 3.5–5.0)
Alkaline Phosphatase: 105 U/L (ref 38–126)
Anion gap: 9 (ref 5–15)
BUN: 10 mg/dL (ref 8–23)
CO2: 26 mmol/L (ref 22–32)
Calcium: 8 mg/dL — ABNORMAL LOW (ref 8.9–10.3)
Chloride: 99 mmol/L (ref 98–111)
Creatinine, Ser: 0.56 mg/dL — ABNORMAL LOW (ref 0.61–1.24)
GFR, Estimated: >60 mL/min (ref >60)
Glucose, Bld: 101 mg/dL — ABNORMAL HIGH (ref 70–99)
Potassium: 2.7 mmol/L — CL (ref 3.5–5.1)
Sodium: 134 mmol/L — ABNORMAL LOW (ref 135–145)
Total Bilirubin: 3.1 mg/dL — ABNORMAL HIGH (ref 0.3–1.2)
Total Protein: 7.5 g/dL (ref 6.5–8.1)

## 2023-06-20 LAB — GLUCOSE, CAPILLARY
Glucose-Capillary: 106 mg/dL — ABNORMAL HIGH (ref 70–99)
Glucose-Capillary: 107 mg/dL — ABNORMAL HIGH (ref 70–99)
Glucose-Capillary: 110 mg/dL — ABNORMAL HIGH (ref 70–99)
Glucose-Capillary: 110 mg/dL — ABNORMAL HIGH (ref 70–99)
Glucose-Capillary: 113 mg/dL — ABNORMAL HIGH (ref 70–99)
Glucose-Capillary: 115 mg/dL — ABNORMAL HIGH (ref 70–99)

## 2023-06-20 LAB — CBC
HCT: 33.2 % — ABNORMAL LOW (ref 39.0–52.0)
Hemoglobin: 11.5 g/dL — ABNORMAL LOW (ref 13.0–17.0)
MCH: 31.9 pg (ref 26.0–34.0)
MCHC: 34.6 g/dL (ref 30.0–36.0)
MCV: 92 fL (ref 80.0–100.0)
Platelets: 70 10*3/uL — ABNORMAL LOW (ref 150–400)
RBC: 3.61 MIL/uL — ABNORMAL LOW (ref 4.22–5.81)
RDW: 14.5 % (ref 11.5–15.5)
WBC: 6 10*3/uL (ref 4.0–10.5)
nRBC: 0 % (ref 0.0–0.2)

## 2023-06-20 LAB — URINE DRUG SCREEN, QUALITATIVE (ARMC ONLY)
Amphetamines, Ur Screen: NOT DETECTED
Barbiturates, Ur Screen: NOT DETECTED
Benzodiazepine, Ur Scrn: NOT DETECTED
Cannabinoid 50 Ng, Ur ~~LOC~~: NOT DETECTED
Cocaine Metabolite,Ur ~~LOC~~: NOT DETECTED
MDMA (Ecstasy)Ur Screen: NOT DETECTED
Methadone Scn, Ur: NOT DETECTED
Opiate, Ur Screen: NOT DETECTED
Phencyclidine (PCP) Ur S: NOT DETECTED
Tricyclic, Ur Screen: NOT DETECTED

## 2023-06-20 LAB — MAGNESIUM: Magnesium: 1 mg/dL — ABNORMAL LOW (ref 1.7–2.4)

## 2023-06-20 LAB — PHOSPHORUS: Phosphorus: 4 mg/dL (ref 2.5–4.6)

## 2023-06-20 LAB — MRSA NEXT GEN BY PCR, NASAL: MRSA by PCR Next Gen: DETECTED — AB

## 2023-06-20 MED ORDER — MUPIROCIN 2 % EX OINT
1.0000 | TOPICAL_OINTMENT | Freq: Two times a day (BID) | CUTANEOUS | Status: DC
  Administered 2023-06-20 – 2023-06-22 (×6): 1 via NASAL
  Filled 2023-06-20: qty 22

## 2023-06-20 MED ORDER — FUROSEMIDE 10 MG/ML IJ SOLN
20.0000 mg | Freq: Two times a day (BID) | INTRAMUSCULAR | Status: DC
  Administered 2023-06-20: 20 mg via INTRAVENOUS
  Filled 2023-06-20: qty 4
  Filled 2023-06-20: qty 2

## 2023-06-20 MED ORDER — THIAMINE HCL 100 MG/ML IJ SOLN
500.0000 mg | INTRAVENOUS | Status: AC
  Administered 2023-06-20 – 2023-06-22 (×3): 500 mg via INTRAVENOUS
  Filled 2023-06-20 (×3): qty 5

## 2023-06-20 MED ORDER — PANTOPRAZOLE SODIUM 40 MG IV SOLR
40.0000 mg | Freq: Two times a day (BID) | INTRAVENOUS | Status: DC
  Administered 2023-06-20 – 2023-06-23 (×8): 40 mg via INTRAVENOUS
  Filled 2023-06-20 (×8): qty 10

## 2023-06-20 MED ORDER — INSULIN ASPART 100 UNIT/ML IJ SOLN: 0.0000 [IU] | INTRAMUSCULAR | Status: DC

## 2023-06-20 MED ORDER — CHLORHEXIDINE GLUCONATE CLOTH 2 % EX PADS
6.0000 | MEDICATED_PAD | Freq: Every day | CUTANEOUS | Status: DC
  Administered 2023-06-20 – 2023-06-22 (×3): 6 via TOPICAL

## 2023-06-20 MED ORDER — MAGNESIUM SULFATE 4 GM/100ML IV SOLN
4.0000 g | INTRAVENOUS | Status: AC
  Administered 2023-06-20 (×2): 4 g via INTRAVENOUS
  Filled 2023-06-20 (×2): qty 100

## 2023-06-20 MED ORDER — FOLIC ACID 5 MG/ML IJ SOLN
1.0000 mg | Freq: Every day | INTRAMUSCULAR | Status: DC
  Administered 2023-06-20 – 2023-06-23 (×4): 1 mg via INTRAVENOUS
  Filled 2023-06-20 (×5): qty 0.2

## 2023-06-20 MED ORDER — INSULIN ASPART 100 UNIT/ML IJ SOLN
0.0000 [IU] | INTRAMUSCULAR | Status: DC
  Administered 2023-06-21 – 2023-06-22 (×4): 1 [IU] via SUBCUTANEOUS
  Filled 2023-06-20: qty 1

## 2023-06-20 MED ORDER — POTASSIUM CHLORIDE 10 MEQ/100ML IV SOLN
10.0000 meq | INTRAVENOUS | Status: AC
  Administered 2023-06-20 (×2): 10 meq via INTRAVENOUS
  Filled 2023-06-20: qty 100

## 2023-06-20 MED ORDER — POTASSIUM CHLORIDE 10 MEQ/100ML IV SOLN
10.0000 meq | INTRAVENOUS | Status: AC
  Administered 2023-06-20 (×6): 10 meq via INTRAVENOUS
  Filled 2023-06-20 (×6): qty 100

## 2023-06-20 MED ORDER — SPIRONOLACTONE 25 MG PO TABS
25.0000 mg | ORAL_TABLET | Freq: Two times a day (BID) | ORAL | Status: DC
  Administered 2023-06-20 – 2023-06-22 (×6): 25 mg via ORAL
  Filled 2023-06-20 (×6): qty 1

## 2023-06-20 NOTE — Assessment & Plan Note (Signed)
Suspect wernicke , pt presenting with delirium hallucination and has nystagmus on exam . Pt given thiamine 500 mg stat we will also get MRI brain non contrast  stat to rule out any other Neuro pathology I.e stroke or cancer.  Patient to stepdown unit withdrawal protocol and close monitoring. Every hour neurochecks.

## 2023-06-20 NOTE — Assessment & Plan Note (Addendum)
Pt had USG guided paracentesis on  06/06/2023 Pt referred to GI for plan for TIPPS. Dr. Everlene Farrier is sending him to [facility] but they cannot see him until 10/04/2023.  Pt has ascites today and abd is soft , protuberant as well.  Will give lasix low dose and monitor.

## 2023-06-20 NOTE — Assessment & Plan Note (Signed)
    Latest Ref Rng & Units 06/19/2023    9:24 PM 05/30/2023    3:40 PM 05/16/2023   11:15 AM  BMP  Glucose 70 - 99 mg/dL 657  846  99   BUN 8 - 23 mg/dL 12  7  7    Creatinine 0.61 - 1.24 mg/dL 9.62  9.52  8.41   BUN/Creat Ratio 6 - 22 (calc)   12   Sodium 135 - 145 mmol/L 132  126  133   Potassium 3.5 - 5.1 mmol/L 2.8  3.6  3.7   Chloride 98 - 111 mmol/L 94  93  97   CO2 22 - 32 mmol/L 23  24  27    Calcium 8.9 - 10.3 mg/dL 8.3  8.1  9.0   Will replace and follow.   potassium chloride     thiamine (VITAMIN B1) injection

## 2023-06-20 NOTE — Progress Notes (Signed)
PHARMACY CONSULT NOTE - ELECTROLYTES  Pharmacy Consult for Electrolyte Monitoring and Replacement   Recent Labs: Weight: 104.5 kg (230 lb 6.1 oz) Estimated Creatinine Clearance: 110.7 mL/min (A) (by C-G formula based on SCr of 0.56 mg/dL (L)). Potassium (mmol/L)  Date Value  06/20/2023 2.7 (LL)  11/18/2011 3.7   Magnesium (mg/dL)  Date Value  13/24/4010 1.0 (L)   Calcium (mg/dL)  Date Value  27/25/3664 8.0 (L)   Calcium, Total (mg/dL)  Date Value  40/34/7425 8.6   Albumin (g/dL)  Date Value  95/63/8756 3.3 (L)  06/14/2017 4.8  11/18/2011 4.6   Phosphorus (mg/dL)  Date Value  43/32/9518 4.0   Sodium (mmol/L)  Date Value  06/20/2023 134 (L)  06/14/2017 142  11/18/2011 143   Corrected Ca: 8.6 mg/dL  Assessment  TAVARI COVALT is a 62 y.o. male presenting with hallucinations. PMH significant for significant for alcohol abuse, anxiety, hypertension, sleep apnea, and diabetes mellitus type 2 that is controlled . Pharmacy has been consulted to monitor and replace electrolytes.  KCl 10 mEq IV x 2 ordered by MD with a resulting potassium level of 2.7 mmol/L (Of note, 10 mEq was infusing when lab was drawn), warranting further replacement.  Mag level also low at 1.0 mg/dL, warranting replacement.   Diet: NPO MIVF: N/A Pertinent medications: Furosemide 20 mg IV q12h, spironolactone 25 mg Po q12h  Goal of Therapy: Electrolytes WNL  Plan:  Will give KCl 60 mEq IV given patient is NPO Will also give 4 g Mag sulfate IV x 2 doses Check BMP and Mag with AM labs  Thank you for allowing pharmacy to be a part of this patient's care.  Merryl Hacker, PharmD Clinical Pharmacist 06/20/2023 7:37 AM

## 2023-06-20 NOTE — Progress Notes (Signed)
  Progress Note   Patient: Joel Bryant [id number]:[id number] DOB: 12-31-1960 DOA: 06/19/2023     1 DOS: the patient was seen and examined on 06/20/2023   Brief hospital course: Joel Bryant is a 62 y.o. male with medical history significant for alcohol abuse, anxiety, hypertension, sleep apnea, diabetes mellitus type 2 that is controlled, presenting with hallucinations. At admission, patient vital signs are stable, he had a significant hyponatremia, hypokalemia and hypomagnesemia.  He is also treated for alcohol withdrawal.   Principal Problem:   AMS (altered mental status) Active Problems:   Hypomagnesemia   Controlled type 2 diabetes mellitus with diabetic neuropathy, without long-term current use of insulin (HCC)   Retinitis pigmentosa   Obstructive apnea   GAD (generalized anxiety disorder)   Thrombocytopenia (HCC)   Alcoholism (HCC)   Acute alcoholic hallucinosis (HCC)   Alcoholic cirrhosis of liver with ascites (HCC)   Hypokalemia   Anemia   Hyponatremia   Assessment and Plan:  Alcohol withdrawal delirium. Chronic alcoholism. Patient is placed on alcohol withdrawal protocol.  Mental status seems to be improving today.  Will continue to follow.  Continue thiamine and folic acid.  Hypokalemia Hyponatremia. Hypomagnesemia. Replete potassium and magnesium.  Sodium level also improving.   Alcoholic cirrhosis of liver with ascites (HCC) Pt had USG guided paracentesis on  06/06/2023 Pt referred to GI for plan for TIPPS. Dr. Everlene Farrier is sending him to [facility] but they cannot see him until 10/04/2023.  Condition appears to be stable.  Thrombocytopenia (HCC) Secondary to liver cirrhosis.  Continue to follow.  GAD (generalized anxiety disorder) Currently patient will be managed on lorazepam as needed.  Obstructive apnea CPAP while asleep.   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   Controlled type 2 diabetes mellitus with diabetic  neuropathy, without long-term current use of insulin (HCC) Continue sliding scale insulin.       Subjective:  Patient is less confused today.  Denies any short of breath.  Physical Exam: Vitals:   06/20/23 1000 06/20/23 1100 06/20/23 1200 06/20/23 1300  BP: (!) 128/96 123/84 (!) 147/71 (!) 147/73  Pulse: 78 76 77 82  Resp: (!) 21 18 19  (!) 23  Temp:      TempSrc:      SpO2: 98% 96% (!) 89% 98%  Weight:      Height:       General exam: Appears calm and comfortable  Respiratory system: Clear to auscultation. Respiratory effort normal. Cardiovascular system: S1 & S2 heard, RRR. No JVD, murmurs, rubs, gallops or clicks. No pedal edema. Gastrointestinal system: Abdomen is nondistended, soft and nontender. No organomegaly or masses felt. Normal bowel sounds heard. Central nervous system: Alert and oriented x2.  No focal neurological deficits. Extremities: Symmetric 5 x 5 power. Skin: No rashes, lesions or ulcers Psychiatry: Judgement and insight appear normal. Mood & affect appropriate.    Data Reviewed:  Lab results reviewed.  Family Communication: None  Disposition: Status is: Inpatient Remains inpatient appropriate because: Severity of disease, IV treatment.     Time spent: 35 minutes  Author: Marrion Coy, MD 06/20/2023 1:45 PM  For on call review [email or web address].

## 2023-06-20 NOTE — Assessment & Plan Note (Signed)
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  Fall precaution and visually impaired will need assistance with commode and personal care.

## 2023-06-20 NOTE — Assessment & Plan Note (Signed)
Cpap not ordered as pt is confused. Resume once stable.

## 2023-06-20 NOTE — Assessment & Plan Note (Signed)
    Latest Ref Rng & Units 06/19/2023    9:24 PM 05/30/2023    3:40 PM 05/16/2023   11:15 AM  CBC  WBC 4.0 - 10.5 K/uL 7.0  5.5  7.5   Hemoglobin 13.0 - 17.0 g/dL 08.6  76.1  95.0   Hematocrit 39.0 - 52.0 % 34.7  35.0  38.2   Platelets 150 - 400 K/uL 81  61  87   Pt has  c/h anemia along with thrombocytopenia.  We will monitor and follow.  IV PPI.  Type/screen/ transfuse as indicated.

## 2023-06-21 DIAGNOSIS — D696 Thrombocytopenia, unspecified: Secondary | ICD-10-CM

## 2023-06-21 DIAGNOSIS — K7031 Alcoholic cirrhosis of liver with ascites: Secondary | ICD-10-CM | POA: Diagnosis not present

## 2023-06-21 DIAGNOSIS — E663 Overweight: Secondary | ICD-10-CM | POA: Insufficient documentation

## 2023-06-21 DIAGNOSIS — F10951 Alcohol use, unspecified with alcohol-induced psychotic disorder with hallucinations: Secondary | ICD-10-CM | POA: Diagnosis not present

## 2023-06-21 LAB — CBC
HCT: 33.8 % — ABNORMAL LOW (ref 39.0–52.0)
Hemoglobin: 11.7 g/dL — ABNORMAL LOW (ref 13.0–17.0)
MCH: 31.6 pg (ref 26.0–34.0)
MCHC: 34.6 g/dL (ref 30.0–36.0)
MCV: 91.4 fL (ref 80.0–100.0)
Platelets: 62 10*3/uL — ABNORMAL LOW (ref 150–400)
RBC: 3.7 MIL/uL — ABNORMAL LOW (ref 4.22–5.81)
RDW: 14.2 % (ref 11.5–15.5)
WBC: 7.8 10*3/uL (ref 4.0–10.5)
nRBC: 0 % (ref 0.0–0.2)

## 2023-06-21 LAB — BASIC METABOLIC PANEL
Anion gap: 9 (ref 5–15)
BUN: 7 mg/dL — ABNORMAL LOW (ref 8–23)
CO2: 25 mmol/L (ref 22–32)
Calcium: 8 mg/dL — ABNORMAL LOW (ref 8.9–10.3)
Chloride: 97 mmol/L — ABNORMAL LOW (ref 98–111)
Creatinine, Ser: 0.57 mg/dL — ABNORMAL LOW (ref 0.61–1.24)
GFR, Estimated: >60 mL/min (ref >60)
Glucose, Bld: 109 mg/dL — ABNORMAL HIGH (ref 70–99)
Potassium: 3 mmol/L — ABNORMAL LOW (ref 3.5–5.1)
Sodium: 131 mmol/L — ABNORMAL LOW (ref 135–145)

## 2023-06-21 LAB — GLUCOSE, CAPILLARY
Glucose-Capillary: 102 mg/dL — ABNORMAL HIGH (ref 70–99)
Glucose-Capillary: 109 mg/dL — ABNORMAL HIGH (ref 70–99)
Glucose-Capillary: 195 mg/dL — ABNORMAL HIGH (ref 70–99)
Glucose-Capillary: 145 mg/dL — ABNORMAL HIGH (ref 70–99)
Glucose-Capillary: 135 mg/dL — ABNORMAL HIGH (ref 70–99)

## 2023-06-21 LAB — PHOSPHORUS: Phosphorus: 2.6 mg/dL (ref 2.5–4.6)

## 2023-06-21 LAB — MAGNESIUM: Magnesium: 1.7 mg/dL (ref 1.7–2.4)

## 2023-06-21 MED ORDER — MAGNESIUM SULFATE 2 GM/50ML IV SOLN
2.0000 g | Freq: Once | INTRAVENOUS | Status: AC
  Administered 2023-06-21: 2 g via INTRAVENOUS
  Filled 2023-06-21: qty 50

## 2023-06-21 MED ORDER — MAGNESIUM SULFATE 2 GM/50ML IV SOLN
2.0000 g | Freq: Once | INTRAVENOUS | Status: DC
Start: 2023-06-21 — End: 2023-06-21

## 2023-06-21 MED ORDER — POTASSIUM CHLORIDE 20 MEQ PO PACK
40.0000 meq | PACK | Freq: Once | ORAL | Status: AC
  Administered 2023-06-21: 40 meq via ORAL
  Filled 2023-06-21: qty 2

## 2023-06-21 MED ORDER — POTASSIUM CHLORIDE 10 MEQ/100ML IV SOLN
10.0000 meq | INTRAVENOUS | Status: AC
  Administered 2023-06-21 (×4): 10 meq via INTRAVENOUS
  Filled 2023-06-21 (×4): qty 100

## 2023-06-21 NOTE — Progress Notes (Signed)
  Progress Note   Patient: Joel Bryant [id number] DOB: 11-16-1960 DOA: 06/19/2023     2 DOS: the patient was seen and examined on 06/21/2023   Brief hospital course: CASHEL OSKEY is a 62 y.o. male with medical history significant for alcohol abuse, anxiety, hypertension, sleep apnea, diabetes mellitus type 2 that is controlled, presenting with hallucinations. At admission, patient vital signs are stable, he had a significant hyponatremia, hypokalemia and hypomagnesemia.  He is also treated for alcohol withdrawal.   Principal Problem:   AMS (altered mental status) Active Problems:   Hypomagnesemia   Controlled type 2 diabetes mellitus with diabetic neuropathy, without long-term current use of insulin (HCC)   Retinitis pigmentosa   Obstructive apnea   GAD (generalized anxiety disorder)   Thrombocytopenia (HCC)   Alcoholism (HCC)   Acute alcoholic hallucinosis (HCC)   Alcoholic cirrhosis of liver with ascites (HCC)   Hypokalemia   Anemia   Hyponatremia   Assessment and Plan: Alcohol withdrawal delirium. Chronic alcoholism. Patient started have some confusion and tremor today.  Will continue CIWA protocol, monitor closely in the stepdown unit.   Hypokalemia Hyponatremia. Hypomagnesemia. Continue replete potassium, magnesium 1.7 today, give another dose IV.  Continue to monitor.     Alcoholic cirrhosis of liver with ascites (HCC) Pt had USG guided paracentesis on  06/06/2023 Pt referred to GI for plan for TIPPS. Dr. Everlene Farrier is sending him to [facility] but they cannot see him until 10/04/2023.  Condition appears to be stable.   Thrombocytopenia (HCC) Secondary to liver cirrhosis.  Follow closely, no evidence of bleeding.   GAD (generalized anxiety disorder) Currently patient will be managed on lorazepam as needed.   Obstructive apnea CPAP while asleep.     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    Controlled type 2 diabetes  mellitus with diabetic neuropathy, without long-term current use of insulin (HCC) Continue sliding scale insulin.        Subjective:  Patient has some confusion and a tremor.  Physical Exam: Vitals:   06/21/23 0800 06/21/23 0900 06/21/23 1000 06/21/23 1100  BP:  (!) 144/90 123/85 (!) 149/100  Pulse:  (!) 108 80 88  Resp:  (!) 26 (!) 26 (!) 23  Temp: 98.8 F (37.1 C)     TempSrc: Oral     SpO2:  97% 97% 98%  Weight:      Height:       General exam: Appears calm and comfortable  Respiratory system: Clear to auscultation. Respiratory effort normal. Cardiovascular system: Regular and tachycardic. No JVD, murmurs, rubs, gallops or clicks. No pedal edema. Gastrointestinal system: Abdomen is mildly distended, soft and nontender. No organomegaly or masses felt. Normal bowel sounds heard. Central nervous system: Alert and oriented x2. No focal neurological deficits. Extremities: Symmetric 5 x 5 power. Skin: No rashes, lesions or ulcers Psychiatry: Judgement and insight appear normal. Mood & affect appropriate.    Data Reviewed:  Lab results reviewed.  Family Communication: Mother and aunt updated at bedside.  Disposition: Status is: Inpatient Remains inpatient appropriate because: Severity of disease, IV treatment.     Time spent: 55 minutes  Author: Marrion Coy, MD 06/21/2023 11:42 AM  For on call review [email or web address].

## 2023-06-21 NOTE — Progress Notes (Signed)
PHARMACY CONSULT NOTE  Pharmacy Consult for Electrolyte Monitoring and Replacement   Recent Labs: Potassium (mmol/L)  Date Value  06/21/2023 3.0 (L)  11/18/2011 3.7   Magnesium (mg/dL)  Date Value  86/57/8469 1.7   Calcium (mg/dL)  Date Value  62/95/2841 8.0 (L)   Calcium, Total (mg/dL)  Date Value  32/44/0102 8.6   Albumin (g/dL)  Date Value  72/53/6644 3.3 (L)  06/14/2017 4.8  11/18/2011 4.6   Phosphorus (mg/dL)  Date Value  03/47/4259 2.6   Sodium (mmol/L)  Date Value  06/21/2023 131 (L)  06/14/2017 142  11/18/2011 143    Assessment: Pt is 62 yo male admitted 06/19/23 with med hx "significant for alcohol abuse, anxiety, hypertension, sleep apnea, diabetes mellitus type 2 that is controlled, presenting with hallucinations".Pharmacy consulted to assist with electrolyte monitoring and replacement as indicated.  Goal of Therapy:  Electrolytes within normal limits  Plan:  08/22 0300 K+ 3, Mag 1.7, Corrected Ca 8.6 Order 40 mEq KCl PO, Mag 2 gm IV x 1  Recheck BMP, Mag, Phos tomorrow AM. --Pharmacy will continue to follow along  Otelia Sergeant, PharmD, [facility] 06/21/2023 5:15 AM

## 2023-06-22 DIAGNOSIS — F10951 Alcohol use, unspecified with alcohol-induced psychotic disorder with hallucinations: Secondary | ICD-10-CM | POA: Diagnosis not present

## 2023-06-22 DIAGNOSIS — K7031 Alcoholic cirrhosis of liver with ascites: Secondary | ICD-10-CM | POA: Diagnosis not present

## 2023-06-22 DIAGNOSIS — E1140 Type 2 diabetes mellitus with diabetic neuropathy, unspecified: Secondary | ICD-10-CM

## 2023-06-22 LAB — CBC
HCT: 34.8 % — ABNORMAL LOW (ref 39.0–52.0)
Hemoglobin: 11.7 g/dL — ABNORMAL LOW (ref 13.0–17.0)
MCH: 30.9 pg (ref 26.0–34.0)
MCHC: 33.6 g/dL (ref 30.0–36.0)
MCV: 91.8 fL (ref 80.0–100.0)
Platelets: 73 10*3/uL — ABNORMAL LOW (ref 150–400)
RBC: 3.79 MIL/uL — ABNORMAL LOW (ref 4.22–5.81)
RDW: 14 % (ref 11.5–15.5)
WBC: 8.4 10*3/uL (ref 4.0–10.5)
nRBC: 0 % (ref 0.0–0.2)

## 2023-06-22 LAB — GLUCOSE, CAPILLARY
Glucose-Capillary: 121 mg/dL — ABNORMAL HIGH (ref 70–99)
Glucose-Capillary: 123 mg/dL — ABNORMAL HIGH (ref 70–99)
Glucose-Capillary: 132 mg/dL — ABNORMAL HIGH (ref 70–99)
Glucose-Capillary: 136 mg/dL — ABNORMAL HIGH (ref 70–99)
Glucose-Capillary: 167 mg/dL — ABNORMAL HIGH (ref 70–99)
Glucose-Capillary: 172 mg/dL — ABNORMAL HIGH (ref 70–99)
Glucose-Capillary: 189 mg/dL — ABNORMAL HIGH (ref 70–99)

## 2023-06-22 LAB — BASIC METABOLIC PANEL
Anion gap: 11 (ref 5–15)
BUN: 8 mg/dL (ref 8–23)
CO2: 23 mmol/L (ref 22–32)
Calcium: 8.3 mg/dL — ABNORMAL LOW (ref 8.9–10.3)
Chloride: 95 mmol/L — ABNORMAL LOW (ref 98–111)
Creatinine, Ser: 0.41 mg/dL — ABNORMAL LOW (ref 0.61–1.24)
GFR, Estimated: >60 mL/min (ref >60)
Glucose, Bld: 124 mg/dL — ABNORMAL HIGH (ref 70–99)
Potassium: 3.8 mmol/L (ref 3.5–5.1)
Sodium: 129 mmol/L — ABNORMAL LOW (ref 135–145)

## 2023-06-22 LAB — MAGNESIUM: Magnesium: 1.8 mg/dL (ref 1.7–2.4)

## 2023-06-22 LAB — PHOSPHORUS: Phosphorus: 2.8 mg/dL (ref 2.5–4.6)

## 2023-06-22 MED ORDER — SODIUM CHLORIDE 1 G PO TABS
1.0000 g | ORAL_TABLET | Freq: Two times a day (BID) | ORAL | Status: DC
  Administered 2023-06-22 – 2023-06-23 (×3): 1 g via ORAL
  Filled 2023-06-22 (×4): qty 1

## 2023-06-22 MED ORDER — ORAL CARE MOUTH RINSE: 15.0000 mL | OROMUCOSAL | Status: DC | PRN

## 2023-06-22 NOTE — Plan of Care (Signed)

## 2023-06-22 NOTE — Progress Notes (Signed)
  Progress Note   Patient: CECILIA FORDHAM Bryant:[id number] DOB: 05/02/1961 DOA: 06/19/2023     3 DOS: the patient was seen and examined on 06/22/2023   Brief hospital course: Joel Bryant is a 62 y.o. male with medical history significant for alcohol abuse, anxiety, hypertension, sleep apnea, diabetes mellitus type 2 that is controlled, presenting with hallucinations. At admission, patient vital signs are stable, he had a significant hyponatremia, hypokalemia and hypomagnesemia.  He is also treated for alcohol withdrawal.   Principal Problem:   AMS (altered mental status) Active Problems:   Hypomagnesemia   Controlled type 2 diabetes mellitus with diabetic neuropathy, without long-term current use of insulin (HCC)   Retinitis pigmentosa   Obstructive apnea   GAD (generalized anxiety disorder)   Thrombocytopenia (HCC)   Alcoholism (HCC)   Acute alcoholic hallucinosis (HCC)   Alcoholic cirrhosis of liver with ascites (HCC)   Hypokalemia   Anemia   Hyponatremia   Overweight (BMI 25.0-29.9)   Assessment and Plan: Alcohol withdrawal delirium. Chronic alcoholism. Initially patient had a significant confusion and tremor.  Treated with CIWA protocol.  Condition seem to be better today, he will be transfer out of ICU today. Will follow for another day, most likely discharge home tomorrow if condition does not get worse again.   Hypokalemia Hyponatremia. Hypomagnesemia. Potassium and mag has normalized.  Sodium level still low, start salt tablets, fluid restriction.     Alcoholic cirrhosis of liver with ascites (HCC) Pt had USG guided paracentesis on  06/06/2023 Pt referred to GI for plan for TIPPS. Dr. Everlene Farrier is sending him to [facility] but they cannot see him until 10/04/2023.  Condition appears to be stable.   Thrombocytopenia (HCC) Continue to follow.   GAD (generalized anxiety disorder) Currently patient will be managed on lorazepam as needed.   Obstructive apnea CPAP  while asleep.     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    Controlled type 2 diabetes mellitus with diabetic neuropathy, without long-term current use of insulin (HCC) Continue sliding scale insulin.        Subjective:  Patient doing much better today, no confusion.  Physical Exam: Vitals:   06/22/23 0900 06/22/23 1000 06/22/23 1100 06/22/23 1200  BP:    118/72  Pulse: 78 99 98 71  Resp: (!) 24 (!) 24 (!) 21 (!) 23  Temp:    99.2 F (37.3 C)  TempSrc:    Oral  SpO2: 96% 100% 100% 100%  Weight:      Height:       General exam: Appears calm and comfortable  Respiratory system: Clear to auscultation. Respiratory effort normal. Cardiovascular system: S1 & S2 heard, RRR. No JVD, murmurs, rubs, gallops or clicks. No pedal edema. Gastrointestinal system: Abdomen is nondistended, soft and nontender. No organomegaly or masses felt. Normal bowel sounds heard. Central nervous system: Alert and oriented. No focal neurological deficits. Extremities: Symmetric 5 x 5 power. Skin: No rashes, lesions or ulcers Psychiatry: Judgement and insight appear normal. Mood & affect appropriate.    Data Reviewed:  Lab results reviewed.  Family Communication: mother updated  Disposition: Status is: Inpatient Remains inpatient appropriate because: Severity of disease.     Time spent: 35 minutes  Author: Marrion Coy, MD 06/22/2023 2:01 PM  For on call review [email or web address].

## 2023-06-22 NOTE — Progress Notes (Signed)
Report given to [facility], RN on 1C. Patient transferred via bed to 102. Nurse tech in the room upon arrival to obtain vitals. No questions or concerns raised by patient or receiving staff. Patient care transferred.

## 2023-06-22 NOTE — Progress Notes (Addendum)
Received pt in room 102 from ICU, No distress noted. This nurse agrees with flowsheet unless otherwise documented. Pt oriented to room, bed locked and in lowest position, no request at this time.

## 2023-06-23 DIAGNOSIS — F10951 Alcohol use, unspecified with alcohol-induced psychotic disorder with hallucinations: Secondary | ICD-10-CM | POA: Diagnosis not present

## 2023-06-23 DIAGNOSIS — K7031 Alcoholic cirrhosis of liver with ascites: Secondary | ICD-10-CM | POA: Diagnosis not present

## 2023-06-23 LAB — BASIC METABOLIC PANEL
Anion gap: 9 (ref 5–15)
BUN: 8 mg/dL (ref 8–23)
CO2: 23 mmol/L (ref 22–32)
Calcium: 8.5 mg/dL — ABNORMAL LOW (ref 8.9–10.3)
Chloride: 99 mmol/L (ref 98–111)
Creatinine, Ser: 0.47 mg/dL — ABNORMAL LOW (ref 0.61–1.24)
GFR, Estimated: >60 mL/min (ref >60)
Glucose, Bld: 106 mg/dL — ABNORMAL HIGH (ref 70–99)
Potassium: 3.7 mmol/L (ref 3.5–5.1)
Sodium: 131 mmol/L — ABNORMAL LOW (ref 135–145)

## 2023-06-23 LAB — GLUCOSE, CAPILLARY
Glucose-Capillary: 113 mg/dL — ABNORMAL HIGH (ref 70–99)
Glucose-Capillary: 127 mg/dL — ABNORMAL HIGH (ref 70–99)
Glucose-Capillary: 148 mg/dL — ABNORMAL HIGH (ref 70–99)

## 2023-06-23 LAB — CBC
HCT: 33.1 % — ABNORMAL LOW (ref 39.0–52.0)
Hemoglobin: 11.4 g/dL — ABNORMAL LOW (ref 13.0–17.0)
MCH: 31.3 pg (ref 26.0–34.0)
MCHC: 34.4 g/dL (ref 30.0–36.0)
MCV: 90.9 fL (ref 80.0–100.0)
Platelets: 91 10*3/uL — ABNORMAL LOW (ref 150–400)
RBC: 3.64 MIL/uL — ABNORMAL LOW (ref 4.22–5.81)
RDW: 13.8 % (ref 11.5–15.5)
WBC: 8.3 10*3/uL (ref 4.0–10.5)
nRBC: 0 % (ref 0.0–0.2)

## 2023-06-23 LAB — MAGNESIUM: Magnesium: 1.5 mg/dL — ABNORMAL LOW (ref 1.7–2.4)

## 2023-06-23 MED ORDER — MAGNESIUM SULFATE 4 GM/100ML IV SOLN
4.0000 g | Freq: Once | INTRAVENOUS | Status: AC
  Administered 2023-06-23: 4 g via INTRAVENOUS
  Filled 2023-06-23: qty 100

## 2023-06-23 NOTE — Evaluation (Signed)
Physical Therapy Evaluation Patient Details Name: Joel Bryant MRN: [id number] DOB: 02-27-1961 Today's Date: 06/23/2023  History of Present Illness  Joel Bryant is a 62 y.o. male with medical history significant for alcohol abuse, anxiety, hypertension, sleep apnea, diabetes mellitus type 2 that is controlled, presenting with hallucinations. At admission, patient had significant hyponatremia, hypokalemia and hypomagnesemia.  He is also treated for alcohol withdrawal.  Clinical Impression  The pt is presenting this session with need for several moments of redirection to task due to inattention. He demonstrates the ability to ambulate in room with Min A due to visual deficits. The pt reports having 6 steps to enter his home with unilateral handrail. He is presenting with moderate complexity due to his PMHx and current presentation. It is expected that will skilled PT he will be able to d/c home with family care and HHPT once medically stable. PT will continue to see in order to address balance and gait deficits.         If plan is discharge home, recommend the following: A little help with walking and/or transfers;A little help with bathing/dressing/bathroom;Help with stairs or ramp for entrance;Assistance with cooking/housework   Can travel by private vehicle        Equipment Recommendations    Recommendations for Other Services       Functional Status Assessment Patient has had a recent decline in their functional status and demonstrates the ability to make significant improvements in function in a reasonable and predictable amount of time.     Precautions / Restrictions Precautions Precautions: Fall Precaution Comments: Low vision. Requires hand held assistance for safety. Restrictions Weight Bearing Restrictions: No      Mobility  Bed Mobility               General bed mobility comments: Pt upright in bedside chair on arrival    Transfers Overall transfer level:  Needs assistance Equipment used: Rolling walker (2 wheels) Transfers: Sit to/from Stand Sit to Stand: Min assist           General transfer comment: Pt requiring verbal cues for increased forward translation.    Ambulation/Gait Ambulation/Gait assistance: Min assist Gait Distance (Feet): 30 Feet Assistive device: Rolling walker (2 wheels)   Gait velocity: Gait velocity increased visually on second ambulation attempt.     General Gait Details: Pt visually impaired therefore requiring assistance to manuever in room.  Stairs            Wheelchair Mobility     Tilt Bed    Modified Rankin (Stroke Patients Only)       Balance Overall balance assessment: History of Falls, Mild deficits observed, not formally tested           Standing balance-Leahy Scale: Fair Standing balance comment: Posterior LOB that pt was able to independently correct without assistance.                             Pertinent Vitals/Pain Pain Assessment Pain Assessment: No/denies pain    Home Living Family/patient expects to be discharged to:: Private residence Living Arrangements: Alone Available Help at Discharge: Family   Home Access: Stairs to enter Entrance Stairs-Rails: Left Entrance Stairs-Number of Steps: 6   Home Layout: One level Home Equipment: Agricultural consultant (2 wheels)      Prior Function Prior Level of Function : History of Falls (last six months)  Mobility Comments: Pt requiring hand held assistance to manuever throughout the room       Extremity/Trunk Assessment   Upper Extremity Assessment Upper Extremity Assessment: Overall WFL for tasks assessed    Lower Extremity Assessment Lower Extremity Assessment: Overall WFL for tasks assessed       Communication   Communication Communication: No apparent difficulties Cueing Techniques: Verbal cues;Tactile cues  Cognition Arousal: Lethargic Behavior During Therapy: WFL for tasks  assessed/performed Overall Cognitive Status: No family/caregiver present to determine baseline cognitive functioning                                          General Comments      Exercises     Assessment/Plan    PT Assessment Patient needs continued PT services  PT Problem List Decreased balance       PT Treatment Interventions Gait training;Stair training;Functional mobility training;Therapeutic exercise;Balance training;Neuromuscular re-education;Therapeutic activities    PT Goals (Current goals can be found in the Care Plan section)  Acute Rehab PT Goals Patient Stated Goal: To return home PT Goal Formulation: With patient Time For Goal Achievement: 07/07/23 Potential to Achieve Goals: Good    Frequency Min 3X/week     Co-evaluation               AM-PAC PT "6 Clicks" Mobility  Outcome Measure Help needed turning from your back to your side while in a flat bed without using bedrails?: A Little Help needed moving from lying on your back to sitting on the side of a flat bed without using bedrails?: A Little Help needed moving to and from a bed to a chair (including a wheelchair)?: A Little Help needed standing up from a chair using your arms (e.g., wheelchair or bedside chair)?: A Little Help needed to walk in hospital room?: A Little Help needed climbing 3-5 steps with a railing? : A Lot 6 Click Score: 17    End of Session Equipment Utilized During Treatment: Gait belt;Oxygen Activity Tolerance: Patient tolerated treatment well Patient left: in chair;with chair alarm set;with call bell/phone within reach Nurse Communication: Mobility status PT Visit Diagnosis: Unsteadiness on feet (R26.81);History of falling (Z91.81)    Time: 0910-0950 PT Time Calculation (min) (ACUTE ONLY): 40 min   Charges:   PT Evaluation $PT Eval Moderate Complexity: 1 Mod PT Treatments $Gait Training: 23-37 mins PT General Charges $$ ACUTE PT VISIT: 1 Visit          11:58 AM, 06/23/23 Joel Bryant PT, DPT Physical Therapist - [facility] A Gustave Lindeman 06/23/2023, 11:58 AM

## 2023-06-23 NOTE — Discharge Instructions (Addendum)
  [facility]------> Peer Living Room  [address]                    [place], [place] [address] *Walk-in support M-F 8am-5pm    Intensive Outpatient Programs   [facility] [address] [place],  [place], [place] [phone]  [facility]  (Inpatient and outpatient)[address] [phone]  [facility] Programs - Intensive Outpatient [address] [place], [place] [address]  [phone]  [facility] [address] [place], [place] [phone]   Residential Treatment Programs  [facility](2 years) Residential: 17 days)[facility] [address] [facility] [address] [place], [place], [place] [phone] or [phone]  [facility] [address] [place], [place], [place] [phone]  [facility] [address] [place], [place], [place] [phone] Admissions: 8am-3pm M-F  [facility]: Residential Program [phone] Days)              [place], [place] [phone] or [phone] [address] [place], [place] [address] [phone] (Do virtual or phone assessment, offer transportation within 25 miles, have in patient and Outpatient options)   Mobil Crisis: [facility]:[phone] (for crisis response 24 hours a day)

## 2023-06-23 NOTE — Discharge Summary (Signed)
Physician Discharge Summary   Patient: Joel Bryant MRN: [id number] DOB: 1961-07-06  Admit date:     06/19/2023  Discharge date: 06/23/23  Discharge Physician: Joel Bryant   PCP: Joel Cory, MD   Recommendations at discharge:   Follow-up with PCP in 1 week.  Discharge Diagnoses: Principal Problem:   AMS (altered mental status) Active Problems:   Hypomagnesemia   Controlled type 2 diabetes mellitus with diabetic neuropathy, without long-term current use of insulin (HCC)   Retinitis pigmentosa   Obstructive apnea   GAD (generalized anxiety disorder)   Thrombocytopenia (HCC)   Alcoholism (HCC)   Acute alcoholic hallucinosis (HCC)   Alcoholic cirrhosis of liver with ascites (HCC)   Hypokalemia   Anemia   Hyponatremia   Overweight (BMI 25.0-29.9) COPD. Tobacco abuse. Resolved Problems:   * No resolved hospital problems. *  Hospital Course: Joel Bryant is a 62 y.o. male with medical history significant for alcohol abuse, anxiety, hypertension, sleep apnea, diabetes mellitus type 2 that is controlled, presenting with hallucinations. At admission, patient vital signs are stable, he had a significant hyponatremia, hypokalemia and hypomagnesemia.  He is also treated for alcohol withdrawal. Patient condition had improved, they wish to go home.  Obtain oxygen evaluation, patient appeared to have COPD.  Assessment and Plan: Alcohol withdrawal delirium. Chronic alcoholism. Condition not consistent with Wernicke Korsakoff syndrome. Initially patient had a significant confusion and tremor.  Treated with CIWA protocol.  Patient condition had improved.  Last drinking is 4 days ago, not anticipating getting worse.  Medically stable for discharge.   Hypokalemia Hyponatremia. Hypomagnesemia. Potassium and mag has normalized.   Sodium level is better.  Patient magnesium 1.5 again today, received 4 g of magnesium sulfate.  alcoholic cirrhosis of liver with ascites (HCC) Pt had  USG guided paracentesis on  06/06/2023 Pt referred to GI for plan for TIPPS. Dr. Everlene Farrier is sending him to [facility] but they cannot see him until 10/04/2023.  Condition appears to be stable.   Thrombocytopenia (HCC) Secondary to alcohol drinking.   GAD (generalized anxiety disorder) Currently patient will be managed on lorazepam as needed.   Obstructive apnea Not compliant with CPAP.  Need sleep study as outpatient.     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    Controlled type 2 diabetes mellitus with diabetic neuropathy, without long-term current use of insulin (HCC) Continue sliding scale insulin.       Consultants: None Procedures performed: None  Disposition: Home Diet recommendation:  Discharge Diet Orders (From admission, onward)     Start     Ordered   06/23/23 0000  Diet - low sodium heart healthy        06/23/23 1146           Cardiac diet DISCHARGE MEDICATION: Allergies as of 06/23/2023   No Known Allergies      Medication List     TAKE these medications    atenolol 50 MG tablet Commonly known as: TENORMIN TAKE ONE TABLET BY MOUTH DAILY   atorvastatin 20 MG tablet Commonly known as: LIPITOR TAKE ONE TABLET BY MOUTH ONCE A DAY AT SIX IN THE EVENING   B-12 1000 MCG Subl Place 1 tablet under the tongue daily.   colchicine 0.6 MG tablet TAKE 1 TABLET BY MOUTH TWICE DAILY AS NEEDED. TAKE ONE AND MAY REPEAT A SECONDDOSE IN 2 HOURS, MAY STOP WHEN PAIN RESOLVES   esomeprazole 40 MG capsule Commonly known as: NEXIUM TAKE  ONE CAPSULE BY MOUTH DAILY   furosemide 40 MG tablet Commonly known as: LASIX TAKE ONE TABLET BY MOUTH ONCE A DAY AS NEEDED   lisinopril 5 MG tablet Commonly known as: ZESTRIL TAKE ONE TABLET BY MOUTH DAILY   meclizine 25 MG tablet Commonly known as: ANTIVERT Take 0.5-1 tablets (12.5-25 mg total) by mouth 3 (three) times daily as needed for dizziness.   omega-3 acid ethyl esters 1 g  capsule Commonly known as: LOVAZA TAKE TWO CAPSULES BY MOUTH TWO TIMES DAILY   spironolactone 100 MG tablet Commonly known as: ALDACTONE Take 1 tablet (100 mg total) by mouth in the morning.        Follow-up Information     Joel Cory, MD Follow up in 1 week(s).   Specialty: Family Medicine Contact information: [address] [place] [address] [phone]                Discharge Exam: [name] Weights   06/20/23 0946 06/21/23 0321 06/22/23 0449  Weight: 98.1 kg 83.9 kg 85.6 kg   General exam: Appears calm and comfortable  Respiratory system: Decreased breathing sounds. Respiratory effort normal. Cardiovascular system: S1 & S2 heard, RRR. No JVD, murmurs, rubs, gallops or clicks. No pedal edema. Gastrointestinal system: Abdomen is nondistended, soft and nontender. No organomegaly or masses felt. Normal bowel sounds heard. Central nervous system: Alert and oriented. No focal neurological deficits. Extremities: Symmetric 5 x 5 power. Skin: No rashes, lesions or ulcers Psychiatry: Judgement and insight appear normal. Mood & affect appropriate.    Condition at discharge: good  The results of significant diagnostics from this hospitalization (including imaging, microbiology, ancillary and laboratory) are listed below for reference.   Imaging Studies: MR BRAIN WO CONTRAST  Result Date: 06/20/2023 CLINICAL DATA:  Provided history: Delirium. Confusion. Hallucinations. EXAM: MRI HEAD WITHOUT CONTRAST TECHNIQUE: Multiplanar, multiecho pulse sequences of the brain and surrounding structures were obtained without intravenous contrast. COMPARISON:  Head CT 06/19/2023. FINDINGS: Intermittently motion degraded examination, limiting evaluation. Most notably, the axial T2 FLAIR sequence is moderate-to-severely motion degraded, the axial T1-weighted sequence is moderately motion degraded and the sagittal T1-weighted sequence is moderate-to-severely motion degraded.  Within this limitation, findings are as follows. Brain: No age advanced or lobar predominant parenchymal atrophy. Mild-to-moderate multifocal T2 FLAIR hyperintense signal abnormality within the cerebral white matter, nonspecific but most often secondary to chronic small vessel ischemia. No cortical encephalomalacia is identified. There is no acute infarct. No evidence of an intracranial mass. No chronic intracranial blood products. No extra-axial fluid collection. No midline shift. Vascular: Maintained flow voids within the proximal large arterial vessels. Skull and upper cervical spine: No focal suspicious marrow lesion. Sinuses/Orbits: No mass or acute finding within the imaged orbits. Prior bilateral ocular lens replacement. Mild mucosal thickening, and small mucous retention cyst, within the right maxillary sinus. IMPRESSION: 1. Motion degraded examination. 2. Within this limitation, there is no evidence of an acute intracranial abnormality. The diffusion-weighted imaging is of good quality and there is no evidence of an acute infarct. 3. Mild-to-moderate multifocal T2 FLAIR hyperintense signal abnormality within the cerebral white matter, nonspecific but most often secondary to chronic small vessel ischemia. 4. Mild right maxillary sinus disease. Electronically Signed   By: Jackey Loge D.O.   On: 06/20/2023 10:18   CT HEAD WO CONTRAST ( )  Result Date: 06/19/2023 CLINICAL DATA:  Mental status change, unknown cause EXAM: CT HEAD WITHOUT CONTRAST TECHNIQUE: Contiguous axial images were obtained from the base of the  skull through the vertex without intravenous contrast. RADIATION DOSE REDUCTION: This exam was performed according to the departmental dose-optimization program which includes automated exposure control, adjustment of the mA and/or kV according to patient size and/or use of iterative reconstruction technique. COMPARISON:  CT head 03/13/2023. FINDINGS: Brain: No evidence of large-territorial  acute infarction. No parenchymal hemorrhage. No mass lesion. No extra-axial collection. No mass effect or midline shift. No hydrocephalus. Basilar cisterns are patent. Vascular: No hyperdense vessel. Skull: No acute fracture or focal lesion. Sinuses/Orbits: Paranasal sinuses and mastoid air cells are clear. Bilateral lens replacement. Otherwise the orbits are unremarkable. Other: None. IMPRESSION: No acute intracranial abnormality. Electronically Signed   By: Tish Frederickson M.D.   On: 06/19/2023 23:08   US Paracentesis  Result Date: 06/09/2023 INDICATION: Ascites EXAM: ULTRASOUND GUIDED therapeutic PARACENTESIS MEDICATIONS: 10 cc 1% lidocaine COMPLICATIONS: None immediate. PROCEDURE: Informed written consent was obtained from the patient after a discussion of the risks, benefits and alternatives to treatment. A timeout was performed prior to the initiation of the procedure. Initial ultrasound scanning demonstrates a large amount of ascites within the right lower abdominal quadrant. The right lower abdomen was prepped and draped in the usual sterile fashion. 1% lidocaine was used for local anesthesia. Following this, a 19 gauge, 7-cm, Yueh catheter was introduced. An ultrasound image was saved for documentation purposes. The paracentesis was performed. The catheter was removed and a dressing was applied. The patient tolerated the procedure well without immediate post procedural complication. FINDINGS: A total of approximately 2.4 L of yellow fluid was removed. IMPRESSION: Successful ultrasound-guided paracentesis yielding 2.4 liters of peritoneal fluid. Procedure performed by Mina Marble, PA-C Electronically Signed   By: Olive Bass M.D.   On: 06/09/2023 09:32   CT ABDOMEN PELVIS W CONTRAST  Result Date: 06/04/2023 CLINICAL DATA:  Umbilical hernia.  Cirrhosis. EXAM: CT ABDOMEN AND PELVIS WITH CONTRAST TECHNIQUE: Multidetector CT imaging of the abdomen and pelvis was performed using the standard protocol  following bolus administration of intravenous contrast. RADIATION DOSE REDUCTION: This exam was performed according to the departmental dose-optimization program which includes automated exposure control, adjustment of the mA and/or kV according to patient size and/or use of iterative reconstruction technique. CONTRAST:  OMNIPAQUE IOHEXOL 300 MG/ML  SOLN COMPARISON:  03/25/2022 FINDINGS: Lower Chest: No acute findings. Hepatobiliary: Hepatic cirrhosis and steatosis again noted. No hepatic masses are identified. Gallbladder is unremarkable. No evidence of biliary ductal dilatation. Pancreas:  No mass or inflammatory changes. Spleen: Within normal limits in size and appearance. Adrenals/Urinary Tract: No suspicious masses identified. No evidence of ureteral calculi or hydronephrosis. Stomach/Bowel: Mild diffuse gastric, small bowel and colonic wall thickening is seen, consistent with portal hypertensive gastroenterocolopathy. Moderate ascites and diffuse mesenteric edema is new since prior exam. Vascular/Lymphatic: No pathologically enlarged lymph nodes. No acute vascular findings. Portosystemic venous collaterals are seen in the gastrohepatic and gastrosplenic ligaments, consistent with portal venous hypertension. Reproductive:  No mass or other significant abnormality. Other: A small paraumbilical ventral hernia is seen containing fluid and fat. No herniated bowel loops. Musculoskeletal: No suspicious bone lesions identified. Prior T12 vertebroplasty again noted. IMPRESSION: Hepatic cirrhosis and steatosis, with findings of portal venous hypertension. No radiographic evidence of hepatic neoplasm. New moderate ascites and diffuse mesenteric edema. Mild diffuse gastric, small bowel and colonic wall thickening, consistent with portal hypertensive gastroenterocolopathy. Small paraumbilical ventral hernia containing fluid and fat. No herniated bowel loops. Electronically Signed   By: Danae Orleans M.D.   On:  06/04/2023 17:09  Microbiology: Results for orders placed or performed during the hospital encounter of 06/19/23  MRSA Next Gen by PCR, Nasal     Status: Abnormal   Collection Time: 06/20/23  9:39 AM   Specimen: Nasal Mucosa; Nasal Swab  Result Value Ref Range Status   MRSA by PCR Next Gen DETECTED (A) NOT DETECTED Final    Comment: RESULT CALLED TO, READ BACK BY AND VERIFIED WITH: [facility] MOORE 06/20/2023 1213 BY AB (NOTE) The GeneXpert MRSA Assay (FDA approved for NASAL specimens only), is one component of a comprehensive MRSA colonization surveillance program. It is not intended to diagnose MRSA infection nor to guide or monitor treatment for MRSA infections. Test performance is not FDA approved in patients less than 34 years old. Performed at [facility], [address] [place], [place] [address]     Labs: CBC: Recent Labs  Lab 06/19/23 2124 06/20/23 0418 06/21/23 0300 06/22/23 0228 06/23/23 0435  WBC 7.0 6.0 7.8 8.4 8.3  HGB 11.7* 11.5* 11.7* 11.7* 11.4*  HCT 34.7* 33.2* 33.8* 34.8* 33.1*  MCV 92.5 92.0 91.4 91.8 90.9  PLT 81* 70* 62* 73* 91*   Basic Metabolic Panel: Recent Labs  Lab 06/19/23 2124 06/20/23 0418 06/21/23 0300 06/22/23 0228 06/23/23 0435  NA 132* 134* 131* 129* 131*  K 2.8* 2.7* 3.0* 3.8 3.7  CL 94* 99 97* 95* 99  CO2 23 26 25 23 23   GLUCOSE 134* 101* 109* 124* 106*  BUN 12 10 7* 8 8  CREATININE 0.60* 0.56* 0.57* 0.41* 0.47*  CALCIUM 8.3* 8.0* 8.0* 8.3* 8.5*  MG  --  1.0* 1.7 1.8 1.5*  PHOS  --  4.0 2.6 2.8  --    Liver Function Tests: Recent Labs  Lab 06/19/23 2124 06/20/23 0418  AST 60* 56*  ALT 16 15  ALKPHOS 109 105  BILITOT 3.0* 3.1*  PROT 7.9 7.5  ALBUMIN 3.4* 3.3*   CBG: Recent Labs  Lab 06/22/23 1557 06/22/23 1935 06/22/23 2307 06/23/23 0341 06/23/23 0733  GLUCAP 172* 167* 121* 113* 127*    Discharge time spent: greater than 30 minutes.  Signed: Marrion Coy, MD [facility] 06/23/2023

## 2023-06-23 NOTE — TOC Initial Note (Signed)
Transition of Care [facility]) - Initial/Assessment Note    Patient Details  Name: Joel Bryant MRN: [id number] Date of Birth: 07-24-1961  Transition of Care [facility]) CM/SW Contact:    Allena Katz, LCSW Phone Number: 06/23/2023, 8:59 AM  Clinical Narrative:  Pt admitted with hallucinations from home. Pt reporting he was attempting to stop drinking. CSW added resources to AVS for patient. CSW will reassess for additional needs once patient is more oriented.                        Patient Goals and CMS Choice            Expected Discharge Plan and Services                                              Prior Living Arrangements/Services                       Activities of Daily Living Home Assistive Devices/Equipment: Dan Humphreys (specify type) ADL Screening (condition at time of admission) Patient's cognitive ability adequate to safely complete daily activities?: Yes Is the patient deaf or have difficulty hearing?: No Does the patient have difficulty seeing, even when wearing glasses/contacts?: No Does the patient have difficulty concentrating, remembering, or making decisions?: Yes Patient able to express need for assistance with ADLs?: Yes Does the patient have difficulty dressing or bathing?: Yes Independently performs ADLs?: No Communication: Independent Dressing (OT): Needs assistance Is this a change from baseline?: Change from baseline, expected to last <3days Grooming: Needs assistance Is this a change from baseline?: Change from baseline, expected to last <3 days Feeding: Independent Bathing: Needs assistance Is this a change from baseline?: Change from baseline, expected to last <3 days Toileting: Needs assistance Is this a change from baseline?: Change from baseline, expected to last <3 days In/Out Bed: Needs assistance Is this a change from baseline?: Change from baseline, expected to last <3 days Walks in Home: Needs assistance Is this a  change from baseline?: Change from baseline, expected to last <3 days Does the patient have difficulty walking or climbing stairs?: Yes Weakness of Legs: Both Weakness of Arms/Hands: Both  Permission Sought/Granted                  Emotional Assessment              Admission diagnosis:  Alcohol withdrawal delirium (HCC) [F10.931] AMS (altered mental status) [R41.82] Patient Active Problem List   Diagnosis Date Noted   Overweight (BMI 25.0-29.9) 06/21/2023   Alcoholic cirrhosis of liver with ascites (HCC) 06/20/2023   Hypokalemia 06/20/2023   Anemia 06/20/2023   Hyponatremia 06/20/2023   Acute alcoholic hallucinosis (HCC) 06/19/2023   AMS (altered mental status) 06/19/2023   Cerebral atrophy (HCC) 05/16/2023   Thrombocytopenia (HCC) 05/16/2023   Alcoholism (HCC) 05/16/2023   Periumbilical hernia 05/16/2023   GAD (generalized anxiety disorder) 11/20/2020   History of drug abuse in remission (HCC) 04/30/2020   Closed compression fracture of thoracic vertebra (HCC) 06/13/2018   Fatty liver 06/13/2018   Aortic atherosclerosis (HCC) 06/13/2018   Chronic pain syndrome 09/06/2016   Dyslipidemia associated with type 2 diabetes mellitus (HCC) 09/27/2015   H/O adenomatous polyp of colon 09/27/2015   Dysmetabolic syndrome 09/27/2015   Post Laminectomy Syndrome (L4-5 Hemilaminectomy and partial Facetectomy) 09/27/2015  Lumbar spondylosis 09/27/2015   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   Epidural fibrosis 09/27/2015   Chronic low back pain (Location of Primary Source of Pain) (Bilateral) (L>R) 09/27/2015   Hypomagnesemia 09/27/2015   Chronic left knee pain 09/27/2015   Lumbar spinal stenosis (L4-5) 09/27/2015   Lumbar foraminal stenosis (Bilateral L3-4 and L4-5) 09/27/2015   Lumbar facet hypertrophy (Bilateral, Multilevel) 09/27/2015   Lumbar facet syndrome (Bilateral) 09/27/2015   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) 09/27/2015   Chronic hip  pain (Location of Secondary source of pain) (Bilateral) (L>R) (secondary to osteonecrosis) 09/27/2015   Edema leg 09/13/2015   Gout 09/13/2015   Obstructive apnea 09/13/2015   Anxiety disorder 06/11/2015   Hypertriglyceridemia 06/11/2015   Dyslipidemia 06/11/2015   Controlled type 2 diabetes mellitus with diabetic neuropathy, without long-term current use of insulin (HCC) 06/11/2015   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   Retinitis pigmentosa 06/11/2015   PCP:  Alba Cory, MD Pharmacy:   [facility] - [place], [place] - [address] [place] [address] Phone: [phone] Fax: [phone]     Social Determinants of Health (SDOH) Social History: SDOH Screenings   Food Insecurity: No Food Insecurity (06/20/2023)  Housing: Low Risk  (06/20/2023)  Transportation Needs: Unmet Transportation Needs (06/20/2023)  Utilities: Not At Risk (06/20/2023)  Alcohol Screen: Medium Risk (11/21/2022)  Depression (PHQ2-9): Low Risk  (05/16/2023)  Financial Resource Strain: Low Risk  (11/17/2021)  Physical Activity: Inactive (11/17/2021)  Social Connections: Socially Isolated (11/17/2021)  Stress: No Stress Concern Present (11/17/2021)  Tobacco Use: High Risk (06/20/2023)   SDOH Interventions:     Readmission Risk Interventions     No data to display

## 2023-06-23 NOTE — Progress Notes (Signed)
Patient states he is planning to abstain from drinking upon d/c. Offered to connect him with [facility] for further support. Patient refused. He stated he got off "heroin, crack cocaine and pills cold [place]" so he can do this. Patient encouraged to reach back out if he changes his mind. Patient was provided with flyer for [facility].

## 2023-06-25 MED FILL — Folic Acid Inj 5 MG/ML: INTRAMUSCULAR | Qty: 0.2 | Status: AC

## 2023-07-03 ENCOUNTER — Ambulatory Visit (INDEPENDENT_AMBULATORY_CARE_PROVIDER_SITE_OTHER): Payer: 59 | Admitting: Family Medicine

## 2023-07-03 ENCOUNTER — Encounter: Payer: Self-pay | Admitting: Family Medicine

## 2023-07-03 VITALS — BP 132/80 | HR 66 | Temp 97.5°F | Resp 16 | Ht 68.0 in | Wt 206.0 lb

## 2023-07-03 DIAGNOSIS — E876 Hypokalemia: Secondary | ICD-10-CM

## 2023-07-03 DIAGNOSIS — E871 Hypo-osmolality and hyponatremia: Secondary | ICD-10-CM | POA: Diagnosis not present

## 2023-07-03 DIAGNOSIS — Z09 Encounter for follow-up examination after completed treatment for conditions other than malignant neoplasm: Secondary | ICD-10-CM

## 2023-07-03 DIAGNOSIS — F1021 Alcohol dependence, in remission: Secondary | ICD-10-CM

## 2023-07-03 DIAGNOSIS — E1140 Type 2 diabetes mellitus with diabetic neuropathy, unspecified: Secondary | ICD-10-CM

## 2023-07-03 DIAGNOSIS — K7031 Alcoholic cirrhosis of liver with ascites: Secondary | ICD-10-CM | POA: Diagnosis not present

## 2023-07-03 NOTE — Progress Notes (Signed)
Name: Joel Bryant   MRN: [id number]    DOB: Mar 05, 1961   Date:07/03/2023       Progress Note  Chief Complaint  Patient presents with   Hospitalization Follow-up    Pt states feeling way better     Subjective:   Joel Bryant is a 62 y.o. male, presents to clinic for HFU and transition of care OV Here for hospital follow up/transition of care.  Admit date: 8/20  Discharge date: 8/24 Transition of care not done  Pt was admitted for AMS, alcoholism, withdrawal, electrolyte abnormality, cirrhosis with ascites  No new meds started No labs specified due but electrolytes were abnormal and aggressively replaced the last day and pt decided to go home one day earlier than anticipated  He has not had any alcohol x 2 weeks He is compliant with his med  Reviewed discharge summary: Pt has appt with GI in 2 d  He reports no confusion, no weight gain or swollen/tight abd, he reports losing weight    Admit date:     06/19/2023  Discharge date: 06/23/23  Discharge Physician: Marrion Coy    PCP: Alba Cory, MD    Recommendations at discharge:    Follow-up with PCP in 1 week.   Discharge Diagnoses: Principal Problem:   AMS (altered mental status) Active Problems:   Hypomagnesemia   Controlled type 2 diabetes mellitus with diabetic neuropathy, without long-term current use of insulin (HCC)   Retinitis pigmentosa   Obstructive apnea   GAD (generalized anxiety disorder)   Thrombocytopenia (HCC)   Alcoholism (HCC)   Acute alcoholic hallucinosis (HCC)   Alcoholic cirrhosis of liver with ascites (HCC)   Hypokalemia   Anemia   Hyponatremia   Overweight (BMI 25.0-29.9) COPD. Tobacco abuse. Resolved Problems:   * No resolved hospital problems. *   Hospital Course: Joel Bryant is a 62 y.o. male with medical history significant for alcohol abuse, anxiety, hypertension, sleep apnea, diabetes mellitus type 2 that is controlled, presenting with hallucinations. At  admission, patient vital signs are stable, he had a significant hyponatremia, hypokalemia and hypomagnesemia.  He is also treated for alcohol withdrawal. Patient condition had improved, they wish to go home.  Obtain oxygen evaluation, patient appeared to have COPD.   Assessment and Plan: Alcohol withdrawal delirium. Chronic alcoholism. Condition not consistent with Wernicke Korsakoff syndrome. Initially patient had a significant confusion and tremor.  Treated with CIWA protocol.  Patient condition had improved.  Last drinking is 4 days ago, not anticipating getting worse.  Medically stable for discharge.   Hypokalemia Hyponatremia. Hypomagnesemia. Potassium and mag has normalized.   Sodium level is better.  Patient magnesium 1.5 again today, received 4 g of magnesium sulfate.   alcoholic cirrhosis of liver with ascites (HCC) Pt had USG guided paracentesis on  06/06/2023 Pt referred to GI for plan for TIPPS. Dr. Everlene Farrier is sending him to [facility] but they cannot see him until 10/04/2023.  Condition appears to be stable.   Thrombocytopenia (HCC) Secondary to alcohol drinking.   GAD (generalized anxiety disorder) Currently patient will be managed on lorazepam as needed.   Obstructive apnea Not compliant with CPAP.  Need sleep study as outpatient.     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    Controlled type 2 diabetes mellitus with diabetic neuropathy, without long-term current use of insulin (HCC) Continue sliding scale insulin.   Pt still has f/up with [facility] coming up On lasix  40 and spironolactone 100    Lab Results  Component Value Date   VITAMINB12 773 10/31/2022      Current Outpatient Medications:    atenolol (TENORMIN) 50 MG tablet, TAKE ONE TABLET BY MOUTH DAILY, Disp: 90 tablet, Rfl: 1   atorvastatin (LIPITOR) 20 MG tablet, TAKE ONE TABLET BY MOUTH ONCE A DAY AT SIX IN THE EVENING, Disp: 90 tablet, Rfl: 1   colchicine 0.6 MG  tablet, TAKE 1 TABLET BY MOUTH TWICE DAILY AS NEEDED. TAKE ONE AND MAY REPEAT A SECONDDOSE IN 2 HOURS, MAY STOP WHEN PAIN RESOLVES, Disp: 30 tablet, Rfl: 1   Cyanocobalamin (B-12) 1000 MCG SUBL, Place 1 tablet under the tongue daily., Disp: 100 tablet, Rfl: 1   esomeprazole (NEXIUM) 40 MG capsule, TAKE ONE CAPSULE BY MOUTH DAILY, Disp: 90 capsule, Rfl: 1   furosemide (LASIX) 40 MG tablet, TAKE ONE TABLET BY MOUTH ONCE A DAY AS NEEDED, Disp: 90 tablet, Rfl: 0   lisinopril (ZESTRIL) 5 MG tablet, TAKE ONE TABLET BY MOUTH DAILY, Disp: 90 tablet, Rfl: 1   meclizine (ANTIVERT) 25 MG tablet, Take 0.5-1 tablets (12.5-25 mg total) by mouth 3 (three) times daily as needed for dizziness., Disp: 30 tablet, Rfl: 0   omega-3 acid ethyl esters (LOVAZA) 1 g capsule, TAKE TWO CAPSULES BY MOUTH TWO TIMES DAILY, Disp: 360 capsule, Rfl: 1   spironolactone (ALDACTONE) 100 MG tablet, Take 1 tablet (100 mg total) by mouth in the morning., Disp: 90 tablet, Rfl: 0  Patient Active Problem List   Diagnosis Date Noted   Overweight (BMI 25.0-29.9) 06/21/2023   Alcoholic cirrhosis of liver with ascites (HCC) 06/20/2023   Hypokalemia 06/20/2023   Anemia 06/20/2023   Hyponatremia 06/20/2023   Acute alcoholic hallucinosis (HCC) 06/19/2023   AMS (altered mental status) 06/19/2023   Cerebral atrophy (HCC) 05/16/2023   Thrombocytopenia (HCC) 05/16/2023   Alcoholism (HCC) 05/16/2023   Periumbilical hernia 05/16/2023   GAD (generalized anxiety disorder) 11/20/2020   History of drug abuse in remission (HCC) 04/30/2020   Closed compression fracture of thoracic vertebra (HCC) 06/13/2018   Fatty liver 06/13/2018   Aortic atherosclerosis (HCC) 06/13/2018   Chronic pain syndrome 09/06/2016   Dyslipidemia associated with type 2 diabetes mellitus (HCC) 09/27/2015   H/O adenomatous polyp of colon 09/27/2015   Dysmetabolic syndrome 09/27/2015   Post Laminectomy Syndrome (L4-5 Hemilaminectomy and partial Facetectomy) 09/27/2015    Lumbar spondylosis 09/27/2015   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   Epidural fibrosis 09/27/2015   Chronic low back pain (Location of Primary Source of Pain) (Bilateral) (L>R) 09/27/2015   Hypomagnesemia 09/27/2015   Chronic left knee pain 09/27/2015   Lumbar spinal stenosis (L4-5) 09/27/2015   Lumbar foraminal stenosis (Bilateral L3-4 and L4-5) 09/27/2015   Lumbar facet hypertrophy (Bilateral, Multilevel) 09/27/2015   Lumbar facet syndrome (Bilateral) 09/27/2015   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) 09/27/2015   Chronic hip pain (Location of Secondary source of pain) (Bilateral) (L>R) (secondary to osteonecrosis) 09/27/2015   Edema leg 09/13/2015   Gout 09/13/2015   Obstructive apnea 09/13/2015   Anxiety disorder 06/11/2015   Hypertriglyceridemia 06/11/2015   Dyslipidemia 06/11/2015   Controlled type 2 diabetes mellitus with diabetic neuropathy, without long-term current use of insulin (HCC) 06/11/2015   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   Retinitis pigmentosa 06/11/2015    Past Surgical History:  Procedure Laterality Date   BACK SURGERY  2001   laminectomy lumbar spine   CATARACT EXTRACTION  1992   Insert Prosthetic  Lens   COLONOSCOPY WITH PROPOFOL N/A 02/28/2017   Procedure: COLONOSCOPY WITH PROPOFOL;  Surgeon: Scot Jun, MD;  Location: [facility] ENDOSCOPY;  Service: Endoscopy;  Laterality: N/A;   COLONOSCOPY WITH PROPOFOL N/A 06/03/2020   Procedure: COLONOSCOPY WITH PROPOFOL;  Surgeon: Regis Bill, MD;  Location: [facility] ENDOSCOPY;  Service: Endoscopy;  Laterality: N/A;   EYE SURGERY     KYPHOPLASTY N/A 08/08/2018   Procedure: [id number];  Surgeon: Kennedy Bucker, MD;  Location: [facility] ORS;  Service: Orthopedics;  Laterality: N/A;   LAMINECTOMY  2001   lumbar   MULTIPLE TOOTH EXTRACTIONS     2018   TIBIA FRACTURE SURGERY     TONSILLECTOMY     WRIST SURGERY Right    x2    Family History  Problem Relation Age of Onset   Colon  polyps Mother    Retinitis pigmentosa Mother    Retinitis pigmentosa Maternal Grandmother    Cancer Maternal Grandfather    Diabetes Paternal Grandmother    Alcohol abuse Father     Social History   Tobacco Use   Smoking status: Some Days    Current packs/day: 0.75    Average packs/day: 0.8 packs/day for 35.0 years (26.3 ttl pk-yrs)    Types: Cigarettes   Smokeless tobacco: Never   Tobacco comments:    Quit smoking for 9 years, started again Feb 2022    Started at age 44 and smokes for a total of 30 plus years   Vaping Use   Vaping status: Never Used  Substance Use Topics   Alcohol use: Yes    Comment: 15 beers per day   Drug use: Not Currently    Types: Marijuana, "Crack" cocaine, Cocaine, Other-see comments    Comment: Cocaine use previously; marijuana currently occasionally     No Known Allergies  Health Maintenance  Topic Date Due   Lung Cancer Screening  Never done   OPHTHALMOLOGY EXAM  04/29/2018   INFLUENZA VACCINE  05/31/2023   COVID-19 Vaccine (5 - 2023-24 season) 07/19/2023 (Originally 07/01/2023)   Colonoscopy  11/22/2023 (Originally 06/03/2021)   FOOT EXAM  08/02/2023   Diabetic kidney evaluation - Urine ACR  11/01/2023   HEMOGLOBIN A1C  11/16/2023   Medicare Annual Wellness (AWV)  11/22/2023   Diabetic kidney evaluation - eGFR measurement  06/22/2024   DTaP/Tdap/Td (2 - Td or Tdap) 09/11/2026   Hepatitis C Screening  Completed   HIV Screening  Completed   Zoster Vaccines- Shingrix  Completed   HPV VACCINES  Aged Out    Chart Review Today: I personally reviewed active problem list, medication list, allergies, family history, social history, health maintenance, notes from last encounter, lab results, imaging with the patient/caregiver today.   Review of Systems  Constitutional: Negative.   HENT: Negative.    Eyes: Negative.   Respiratory: Negative.    Cardiovascular: Negative.   Gastrointestinal: Negative.   Endocrine: Negative.   Genitourinary:  Negative.   Musculoskeletal: Negative.   Skin: Negative.   Allergic/Immunologic: Negative.   Neurological: Negative.   Hematological: Negative.   Psychiatric/Behavioral: Negative.    All other systems reviewed and are negative.    Objective:   Vitals:   07/03/23 1445  BP: 132/80  Pulse: 66  Resp: 16  Temp: (!) 97.5 F (36.4 C)  TempSrc: Temporal  SpO2: 98%  Weight: 206 lb (93.4 kg)  Height: 5\' 8"  (1.727 m)    Body mass index is 31.32 kg/m.  Physical Exam Vitals and nursing note  reviewed.  Constitutional:      General: He is not in acute distress.    Appearance: He is obese. He is not toxic-appearing or diaphoretic.  HENT:     Head: Normocephalic and atraumatic.     Right Ear: External ear normal.     Left Ear: External ear normal.     Nose: Nose normal.     Mouth/Throat:     Mouth: Mucous membranes are moist.     Pharynx: Oropharynx is clear.  Eyes:     General:        Right eye: No discharge.        Left eye: No discharge.     Conjunctiva/sclera: Conjunctivae normal.  Cardiovascular:     Rate and Rhythm: Normal rate and regular rhythm.     Pulses: Normal pulses.     Heart sounds: Normal heart sounds.  Pulmonary:     Effort: Pulmonary effort is normal.     Breath sounds: Normal breath sounds.  Abdominal:     General: Abdomen is protuberant. There is no distension.     Palpations: Abdomen is soft.     Tenderness: There is generalized abdominal tenderness.     Hernia: A hernia is present.  Skin:    General: Skin is warm and dry.     Coloration: Skin is not jaundiced or pale.  Neurological:     Mental Status: He is alert. Mental status is at baseline.     Gait: Gait abnormal.         Assessment & Plan:       ICD-10-CM   1. Encounter for examination following treatment at hospital  Z09 COMPLETE METABOLIC PANEL WITH GFR    Magnesium   reviewed records extensively, pt need some labs checked (deficiencies below) but they prefer to wait for GI appt,  will need to check labs when done at [facility]    2. Chronic alcoholism in remission (HCC)  F10.21 COMPLETE METABOLIC PANEL WITH GFR    Magnesium   x 2 weeks    3. Controlled type 2 diabetes mellitus with diabetic neuropathy, without long-term current use of insulin (HCC)  E11.40    last A1C less than 2 months ago and well controlled 5.7    4. Alcoholic cirrhosis of liver with ascites (HCC)  K70.31 COMPLETE METABOLIC PANEL WITH GFR    Magnesium   going to see GI in 2 days, he had paracentesis inpt, no reaccumulation of fluid/weight per pt and mother's report    5. Hyponatremia  E87.1 COMPLETE METABOLIC PANEL WITH GFR    6. Hypokalemia  E87.6 COMPLETE METABOLIC PANEL WITH GFR    7. Hypomagnesemia  E83.42 COMPLETE METABOLIC PANEL WITH GFR    Magnesium    He is on b12 supplement, consider folate/thiamine - will see if GI addresses    Return for 1 month PCP f/up cirrhosis, electrolyte abnormalities/meds.   Danelle Berry, PA-C 07/03/23 2:57 PM

## 2023-07-05 DIAGNOSIS — I700 Atherosclerosis of aorta: Secondary | ICD-10-CM | POA: Diagnosis not present

## 2023-07-05 DIAGNOSIS — M1A0290 Idiopathic chronic gout, unspecified elbow, without tophus (tophi): Secondary | ICD-10-CM | POA: Diagnosis not present

## 2023-07-05 DIAGNOSIS — Z86010 Personal history of colonic polyps: Secondary | ICD-10-CM | POA: Diagnosis not present

## 2023-07-05 DIAGNOSIS — K429 Umbilical hernia without obstruction or gangrene: Secondary | ICD-10-CM | POA: Diagnosis not present

## 2023-07-05 DIAGNOSIS — E871 Hypo-osmolality and hyponatremia: Secondary | ICD-10-CM | POA: Diagnosis not present

## 2023-07-05 DIAGNOSIS — G4733 Obstructive sleep apnea (adult) (pediatric): Secondary | ICD-10-CM | POA: Diagnosis not present

## 2023-08-02 DIAGNOSIS — E871 Hypo-osmolality and hyponatremia: Secondary | ICD-10-CM | POA: Diagnosis not present

## 2023-08-02 DIAGNOSIS — K429 Umbilical hernia without obstruction or gangrene: Secondary | ICD-10-CM | POA: Diagnosis not present

## 2023-08-02 DIAGNOSIS — R600 Localized edema: Secondary | ICD-10-CM | POA: Diagnosis not present

## 2023-08-02 DIAGNOSIS — Z860101 Personal history of adenomatous and serrated colon polyps: Secondary | ICD-10-CM | POA: Diagnosis not present

## 2023-08-02 DIAGNOSIS — K766 Portal hypertension: Secondary | ICD-10-CM | POA: Diagnosis not present

## 2023-08-03 ENCOUNTER — Telehealth: Payer: Self-pay | Admitting: Family Medicine

## 2023-08-03 NOTE — Telephone Encounter (Signed)
Copied from CRM [id number]. Topic: Referral - Request for Referral >> Aug 03, 2023  8:43 AM Franchot Heidelberg wrote: Reason for CRM: Pt called requesting a licensed substance abuse counselor so that he can be placed on a list for a liver transplant. Recommended by Dr. Norma Fredrickson his liver doctor.   Somewhere local preferably

## 2023-08-06 NOTE — Telephone Encounter (Signed)
Pt called back again checking status of referral for substance, says he needs a response right away because he has to do this first before he can get on the list for a liver transplant.

## 2023-08-07 NOTE — Telephone Encounter (Signed)
I've sent a teams message out, waiting to see who is covering and will forward.

## 2023-08-14 ENCOUNTER — Telehealth: Payer: Self-pay

## 2023-08-14 NOTE — Telephone Encounter (Signed)
Information given to patient's mother as patient was unable to transcribe per Dr. Carlynn Purl' advice:  [facility]  [address] [place], [place] [address] Hours:  Open ? Closes 12?AM Phone: [phone]

## 2023-08-17 NOTE — Telephone Encounter (Signed)
Completed.

## 2023-08-21 ENCOUNTER — Other Ambulatory Visit: Payer: Self-pay | Admitting: Family Medicine

## 2023-08-21 DIAGNOSIS — R600 Localized edema: Secondary | ICD-10-CM

## 2023-08-21 DIAGNOSIS — E785 Hyperlipidemia, unspecified: Secondary | ICD-10-CM

## 2023-08-21 DIAGNOSIS — I10 Essential (primary) hypertension: Secondary | ICD-10-CM

## 2023-08-22 ENCOUNTER — Other Ambulatory Visit: Payer: Self-pay

## 2023-08-22 DIAGNOSIS — R600 Localized edema: Secondary | ICD-10-CM

## 2023-08-22 DIAGNOSIS — E785 Hyperlipidemia, unspecified: Secondary | ICD-10-CM

## 2023-08-22 NOTE — Progress Notes (Unsigned)
Name: Joel Bryant   MRN: [id number]    DOB: 1961/09/20   Date:08/23/2023       Progress Note  Subjective  Chief Complaint  Follow Up  HPI   DM II with complications. diagnosed years ago, he was on Januvia and Metformin but off medications for years now.  A1C is still at goal, today it was up from 5.7 % to 6.4 % He still has some neuropathy/paresthesia on toes but stable and mild,  he still has microalbuminuria and is on ACE inhibitor, but since bp is low we will decrease dose to 2.5 mg today..   Denies polyphagia, polyuria or polydipsia . He is legally blind and does not want to see ophthalmologist   Weight loss: seen by Dr. Andee Poles because due to difficulty swallowing, he had CT neck and showed some thickness on his esophagus, he states he was advised to resume Nexium and Fexofenadine  ( but he stopped taking it Allegra)  He is now seeing GI due to new diagnosis of alcoholic cirrhosis , still has dysphagia and will have EGD in December  Alcoholic liver cirrhosis:  he drank heavily for many years, he was diagnosed with liver cirrhosis this Summer and tried to quit cold [place], he felt very paranoid, was hospitalized and states no alcohol in the past 2 months. He is seeing Dr. Norma Fredrickson, he is going to go to [facility] for evaluation and needs to get into a alcohol support group to be eligible for liver transplant. He is now on spironolactone   Aorta Atherosclerosis: found  on CT abdomen done 05/22/2018 , he was taking  Atorvastatin  and aspirin, but due to cirrhosis of liver he is now only taking fish oil.   History of drug abuse: he states he has used all kinds of drugs, including cocaine, heroine, he states he quit all illegal drugs in 2019  He states cannot go to the pain clinic because of his history of drug addiction and is in constant  pain - on his back but the pain is no longer severe . Unchanbed  HTN: he denies chest pain or palpitation, seems to be taking medication daily   Smoker's cough:  he started smoking at age 62 , quit for 9 years and resumed smoking in 2022, he smokes less than one pack per day now.  He coughs daily and symptoms are stable. 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 , mother drove him to our office today but waited at the lobby   Brain Atrophy: found on CT brain, he is no longer drinking alcohol, off statin therapy  Patient Active Problem List   Diagnosis Date Noted   Overweight (BMI 25.0-29.9) 06/21/2023   Alcoholic cirrhosis of liver with ascites (HCC) 06/20/2023   Hypokalemia 06/20/2023   Anemia 06/20/2023   Hyponatremia 06/20/2023   Acute alcoholic hallucinosis (HCC) 06/19/2023   AMS (altered mental status) 06/19/2023   Cerebral atrophy (HCC) 05/16/2023   Thrombocytopenia (HCC) 05/16/2023   Alcoholism (HCC) 05/16/2023   Periumbilical hernia 05/16/2023   GAD (generalized anxiety disorder) 11/20/2020   History of drug abuse in remission (HCC) 04/30/2020   Closed compression fracture of thoracic vertebra (HCC) 06/13/2018   Fatty liver 06/13/2018   Aortic atherosclerosis (HCC) 06/13/2018   Chronic pain syndrome 09/06/2016   Dyslipidemia associated with type 2 diabetes mellitus (HCC) 09/27/2015   H/O adenomatous polyp of colon 09/27/2015  Dysmetabolic syndrome 09/27/2015   Post Laminectomy Syndrome (L4-5 Hemilaminectomy and partial Facetectomy) 09/27/2015   Lumbar spondylosis 09/27/2015   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   Epidural fibrosis 09/27/2015   Chronic low back pain (Location of Primary Source of Pain) (Bilateral) (L>R) 09/27/2015   Hypomagnesemia 09/27/2015   Chronic left knee pain 09/27/2015   Lumbar spinal stenosis (L4-5) 09/27/2015   Lumbar foraminal stenosis (Bilateral L3-4 and L4-5) 09/27/2015   Lumbar facet hypertrophy (Bilateral, Multilevel) 09/27/2015   Lumbar facet syndrome (Bilateral) 09/27/2015   Bilateral hip osteonecrosis (HCC) (Bilateral  Femoral Head) 09/27/2015   Chronic hip pain (Location of Secondary source of pain) (Bilateral) (L>R) (secondary to osteonecrosis) 09/27/2015   Edema leg 09/13/2015   Gout 09/13/2015   Obstructive apnea 09/13/2015   Anxiety disorder 06/11/2015   Hypertriglyceridemia 06/11/2015   Dyslipidemia 06/11/2015   Controlled type 2 diabetes mellitus with diabetic neuropathy, without long-term current use of insulin (HCC) 06/11/2015   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   Retinitis pigmentosa 06/11/2015    Past Surgical History:  Procedure Laterality Date   BACK SURGERY  2001   laminectomy lumbar spine   CATARACT EXTRACTION  1992   Insert Prosthetic Lens   COLONOSCOPY WITH PROPOFOL N/A 02/28/2017   Procedure: COLONOSCOPY WITH PROPOFOL;  Surgeon: Scot Jun, MD;  Location: [facility] ENDOSCOPY;  Service: Endoscopy;  Laterality: N/A;   COLONOSCOPY WITH PROPOFOL N/A 06/03/2020   Procedure: COLONOSCOPY WITH PROPOFOL;  Surgeon: Regis Bill, MD;  Location: [facility] ENDOSCOPY;  Service: Endoscopy;  Laterality: N/A;   EYE SURGERY     KYPHOPLASTY N/A 08/08/2018   Procedure: [id number];  Surgeon: Kennedy Bucker, MD;  Location: [facility] ORS;  Service: Orthopedics;  Laterality: N/A;   LAMINECTOMY  2001   lumbar   MULTIPLE TOOTH EXTRACTIONS     2018   TIBIA FRACTURE SURGERY     TONSILLECTOMY     WRIST SURGERY Right    x2    Family History  Problem Relation Age of Onset   Colon polyps Mother    Retinitis pigmentosa Mother    Retinitis pigmentosa Maternal Grandmother    Cancer Maternal Grandfather    Diabetes Paternal Grandmother    Alcohol abuse Father     Social History   Tobacco Use   Smoking status: Some Days    Current packs/day: 0.75    Average packs/day: 0.8 packs/day for 35.0 years (26.3 ttl pk-yrs)    Types: Cigarettes   Smokeless tobacco: Never   Tobacco comments:    Quit smoking for 9 years, started again Feb 2022    Started at age 62 and smokes for a total of 30 plus  years   Substance Use Topics   Alcohol use: Yes    Comment: 15 beers per day     Current Outpatient Medications:    colchicine 0.6 MG tablet, TAKE 1 TABLET BY MOUTH TWICE DAILY AS NEEDED. TAKE ONE AND MAY REPEAT A SECONDDOSE IN 2 HOURS, MAY STOP WHEN PAIN RESOLVES, Disp: 30 tablet, Rfl: 1   Cyanocobalamin (B-12) 1000 MCG SUBL, Place 1 tablet under the tongue daily., Disp: 100 tablet, Rfl: 1   meclizine (ANTIVERT) 25 MG tablet, Take 0.5-1 tablets (12.5-25 mg total) by mouth 3 (three) times daily as needed for dizziness., Disp: 30 tablet, Rfl: 0   spironolactone (ALDACTONE) 100 MG tablet, Take 100 mg by mouth daily., Disp: , Rfl:    atenolol (TENORMIN) 50 MG tablet, Take 1 tablet (50 mg total) by mouth daily., Disp:  90 tablet, Rfl: 1   atorvastatin (LIPITOR) 20 MG tablet, TAKE ONE TABLET BY MOUTH ONCE A DAY AT SIX IN THE EVENING, Disp: 90 tablet, Rfl: 1   esomeprazole (NEXIUM) 40 MG capsule, Take 1 capsule (40 mg total) by mouth daily., Disp: 90 capsule, Rfl: 1   furosemide (LASIX) 40 MG tablet, Take 1 tablet (40 mg total) by mouth daily., Disp: 90 tablet, Rfl: 0   lisinopril (ZESTRIL) 2.5 MG tablet, Take 1 tablet (2.5 mg total) by mouth daily., Disp: 90 tablet, Rfl: 1   omega-3 acid ethyl esters (LOVAZA) 1 g capsule, Take 2 capsules (2 g total) by mouth 2 (two) times daily., Disp: 360 capsule, Rfl: 1  No Known Allergies  I personally reviewed active problem list, medication list, allergies, family history, social history, health maintenance with the patient/caregiver today.   ROS  Ten systems reviewed and is negative except as mentioned in HPI    Objective  Vitals:   08/23/23 1019  BP: 118/70  Pulse: 74  Resp: 16  Weight: 201 lb (91.2 kg)  Height: 5\' 8"  (1.727 m)    Body mass index is 30.56 kg/m.  Physical Exam  Constitutional: Patient appears well-developed and well-nourished. Obese  No distress.  HEENT: head atraumatic, normocephalic, pupils equal and reactive to light,  neck supple, Cardiovascular: Normal rate, regular rhythm and normal heart sounds.  No murmur heard. No BLE edema. Pulmonary/Chest: Effort normal and breath sounds normal. No respiratory distress. Abdominal: Soft.  There is no tenderness. Psychiatric: Patient has a normal mood and affect. behavior is normal. Judgment and thought content normal.   Diabetic Foot Exam - Simple   Simple Foot Form Visual Inspection No deformities, no ulcerations, no other skin breakdown bilaterally: Yes Sensation Testing Intact to touch and monofilament testing bilaterally: Yes Pulse Check Posterior Tibialis and Dorsalis pulse intact bilaterally: Yes Comments      PHQ2/9:    08/23/2023   10:18 AM 07/03/2023    2:45 PM 05/16/2023   10:38 AM 03/09/2023    1:35 PM 11/21/2022    2:03 PM  Depression screen PHQ 2/9  Decreased Interest 0 0 0 0 0  Down, Depressed, Hopeless 0 0 0 0 0  PHQ - 2 Score 0 0 0 0 0  Altered sleeping 0 0 0 0 0  Tired, decreased energy 0 0 0 0 0  Change in appetite 0 0 0 0 0  Feeling bad or failure about yourself  0 0 0 0 0  Trouble concentrating 0 0 0 0 0  Moving slowly or fidgety/restless 0 0 0 0 0  Suicidal thoughts 0 0 0 0 0  PHQ-9 Score 0 0 0 0 0  Difficult doing work/chores  Not difficult at all  Not difficult at all Not difficult at all    phq 9 is negative   Fall Risk:    08/23/2023   10:18 AM 07/03/2023    2:44 PM 05/16/2023   10:38 AM 03/09/2023    1:34 PM 11/21/2022    2:03 PM  Fall Risk   Falls in the past year? 1 1 0 0 0  Number falls in past yr: 0 0 0 0 0  Injury with Fall? 0 0 0 0 0  Risk for fall due to : Impaired balance/gait;Impaired vision Impaired balance/gait Impaired vision No Fall Risks No Fall Risks  Follow up Falls prevention discussed Falls prevention discussed;Education provided;Falls evaluation completed Falls prevention discussed Falls prevention discussed;Education provided;Falls evaluation completed Falls prevention  discussed;Education  provided;Falls evaluation completed      Functional Status Survey: Is the patient deaf or have difficulty hearing?: No Does the patient have difficulty seeing, even when wearing glasses/contacts?: Yes Does the patient have difficulty concentrating, remembering, or making decisions?: Yes Does the patient have difficulty walking or climbing stairs?: Yes Does the patient have difficulty dressing or bathing?: Yes Does the patient have difficulty doing errands alone such as visiting a doctor's office or shopping?: Yes    Assessment & Plan  1. Controlled type 2 diabetes mellitus with diabetic neuropathy, without long-term current use of insulin (HCC)  - POCT HgB A1C - [facility] Diabetes Foot Exam  2. Need for immunization against influenza  - Flu vaccine trivalent PF, 6mos and older(Flulaval,Afluria,Fluarix,Fluzone)  3. Aortic atherosclerosis (HCC)  - omega-3 acid ethyl esters (LOVAZA) 1 g capsule; Take 2 capsules (2 g total) by mouth 2 (two) times daily.  Dispense: 360 capsule; Refill: 1  4. Cerebral atrophy (HCC)  Currently on fish oil   5. Smokers' cough (HCC)  Stable  6. Alcoholic cirrhosis of liver with ascites (HCC)  Under the care of Dr. Norma Fredrickson, going to [facility] today, needs to have counseling, he already quit drinking since admission in August to be able to be placed on liver transplant list   7. Hypertriglyceridemia  - omega-3 acid ethyl esters (LOVAZA) 1 g capsule; Take 2 capsules (2 g total) by mouth 2 (two) times daily.  Dispense: 360 capsule; Refill: 1  8. Essential hypertension  - lisinopril (ZESTRIL) 2.5 MG tablet; Take 1 tablet (2.5 mg total) by mouth daily.  Dispense: 90 tablet; Refill: 1  9. Gastroesophageal reflux disease without esophagitis  - esomeprazole (NEXIUM) 40 MG capsule; Take 1 capsule (40 mg total) by mouth daily.  Dispense: 90 capsule; Refill: 1  10. Dyslipidemia  - atorvastatin (LIPITOR) 20 MG tablet; TAKE ONE TABLET BY MOUTH ONCE A DAY AT SIX IN  THE EVENING  Dispense: 90 tablet; Refill: 1  11. Bilateral edema of lower extremity  - furosemide (LASIX) 40 MG tablet; Take 1 tablet (40 mg total) by mouth daily.  Dispense: 90 tablet; Refill: 0

## 2023-08-23 ENCOUNTER — Ambulatory Visit (INDEPENDENT_AMBULATORY_CARE_PROVIDER_SITE_OTHER): Payer: 59 | Admitting: Family Medicine

## 2023-08-23 ENCOUNTER — Encounter: Payer: Self-pay | Admitting: Family Medicine

## 2023-08-23 ENCOUNTER — Telehealth: Payer: Self-pay | Admitting: Family Medicine

## 2023-08-23 VITALS — BP 118/70 | HR 74 | Resp 16 | Ht 68.0 in | Wt 201.0 lb

## 2023-08-23 DIAGNOSIS — J410 Simple chronic bronchitis: Secondary | ICD-10-CM | POA: Diagnosis not present

## 2023-08-23 DIAGNOSIS — E781 Pure hyperglyceridemia: Secondary | ICD-10-CM | POA: Diagnosis not present

## 2023-08-23 DIAGNOSIS — K219 Gastro-esophageal reflux disease without esophagitis: Secondary | ICD-10-CM

## 2023-08-23 DIAGNOSIS — E785 Hyperlipidemia, unspecified: Secondary | ICD-10-CM | POA: Diagnosis not present

## 2023-08-23 DIAGNOSIS — Z23 Encounter for immunization: Secondary | ICD-10-CM | POA: Diagnosis not present

## 2023-08-23 DIAGNOSIS — R600 Localized edema: Secondary | ICD-10-CM | POA: Diagnosis not present

## 2023-08-23 DIAGNOSIS — I700 Atherosclerosis of aorta: Secondary | ICD-10-CM

## 2023-08-23 DIAGNOSIS — K7031 Alcoholic cirrhosis of liver with ascites: Secondary | ICD-10-CM

## 2023-08-23 DIAGNOSIS — I10 Essential (primary) hypertension: Secondary | ICD-10-CM

## 2023-08-23 DIAGNOSIS — E1140 Type 2 diabetes mellitus with diabetic neuropathy, unspecified: Secondary | ICD-10-CM | POA: Diagnosis not present

## 2023-08-23 DIAGNOSIS — G319 Degenerative disease of nervous system, unspecified: Secondary | ICD-10-CM

## 2023-08-23 LAB — POCT GLYCOSYLATED HEMOGLOBIN (HGB A1C): Hemoglobin A1C: 6.4 % — AB (ref 4.0–5.6)

## 2023-08-23 MED ORDER — ESOMEPRAZOLE MAGNESIUM 40 MG PO CPDR: 40.0000 mg | DELAYED_RELEASE_CAPSULE | Freq: Every day | ORAL | 1 refills | Status: DC

## 2023-08-23 MED ORDER — LISINOPRIL 2.5 MG PO TABS: 2.5000 mg | ORAL_TABLET | Freq: Every day | ORAL | 1 refills | Status: DC

## 2023-08-23 MED ORDER — OMEGA-3-ACID ETHYL ESTERS 1 G PO CAPS: 2.0000 g | ORAL_CAPSULE | Freq: Two times a day (BID) | ORAL | 1 refills | Status: DC

## 2023-08-23 MED ORDER — ATORVASTATIN CALCIUM 20 MG PO TABS: ORAL_TABLET | ORAL | 1 refills | Status: DC

## 2023-08-23 MED ORDER — FUROSEMIDE 40 MG PO TABS: 40.0000 mg | ORAL_TABLET | Freq: Every day | ORAL | 0 refills | Status: DC

## 2023-08-23 MED ORDER — ATENOLOL 50 MG PO TABS: 50.0000 mg | ORAL_TABLET | Freq: Every day | ORAL | 1 refills | Status: DC

## 2023-08-23 NOTE — Telephone Encounter (Signed)
Joel Bryant with [facility]  [phone]  Lisinipril , strength  Spironolactone ,strength  Clarification.

## 2023-08-27 NOTE — Telephone Encounter (Signed)
Called Thayer Ohm and clarified medication dosages.

## 2023-08-27 NOTE — Telephone Encounter (Unsigned)
Copied from CRM [id number]. Topic: General - Other >> Aug 27, 2023 10:54 AM Everette C wrote: Reason for CRM: Thayer Ohm with the patient's pharmacy has called to request contact with a member of clinical staff to review the patient's medications specifically lisinopril and spironolactone   The urgency of contact has been stressed, the patient anticipates delivery of their medications today   Pease contact further when possible

## 2023-09-17 ENCOUNTER — Telehealth: Payer: Self-pay

## 2023-09-17 NOTE — Patient Outreach (Signed)
Spoke with patient regarding DM Eye exam-patient states he has not done an exam this year and declined a referral or help scheduling an appointment.

## 2023-09-18 ENCOUNTER — Ambulatory Visit: Payer: 59 | Admitting: Family Medicine

## 2023-10-15 ENCOUNTER — Encounter: Payer: Self-pay | Admitting: Internal Medicine

## 2023-10-16 ENCOUNTER — Other Ambulatory Visit: Payer: Self-pay

## 2023-10-16 ENCOUNTER — Ambulatory Visit: Payer: 59 | Admitting: Certified Registered Nurse Anesthetist

## 2023-10-16 ENCOUNTER — Encounter
Admission: RE | Disposition: A | Discharge status: Home / Self Care | Payer: Self-pay | Source: Home / Self Care | Attending: Internal Medicine

## 2023-10-16 ENCOUNTER — Ambulatory Visit
Admission: RE | Admit: 2023-10-16 | Discharge: 2023-10-16 | Disposition: A | Discharge status: Home / Self Care | Payer: 59 | Attending: Internal Medicine | Admitting: Internal Medicine

## 2023-10-16 ENCOUNTER — Encounter: Payer: Self-pay | Admitting: Internal Medicine

## 2023-10-16 DIAGNOSIS — K219 Gastro-esophageal reflux disease without esophagitis: Secondary | ICD-10-CM | POA: Insufficient documentation

## 2023-10-16 DIAGNOSIS — I8500 Esophageal varices without bleeding: Secondary | ICD-10-CM | POA: Diagnosis not present

## 2023-10-16 DIAGNOSIS — E119 Type 2 diabetes mellitus without complications: Secondary | ICD-10-CM | POA: Diagnosis not present

## 2023-10-16 DIAGNOSIS — K3189 Other diseases of stomach and duodenum: Secondary | ICD-10-CM | POA: Diagnosis not present

## 2023-10-16 DIAGNOSIS — F17210 Nicotine dependence, cigarettes, uncomplicated: Secondary | ICD-10-CM | POA: Diagnosis not present

## 2023-10-16 DIAGNOSIS — Z09 Encounter for follow-up examination after completed treatment for conditions other than malignant neoplasm: Secondary | ICD-10-CM | POA: Insufficient documentation

## 2023-10-16 DIAGNOSIS — Z79899 Other long term (current) drug therapy: Secondary | ICD-10-CM | POA: Insufficient documentation

## 2023-10-16 DIAGNOSIS — Z860101 Personal history of adenomatous and serrated colon polyps: Secondary | ICD-10-CM | POA: Diagnosis not present

## 2023-10-16 DIAGNOSIS — K640 First degree hemorrhoids: Secondary | ICD-10-CM | POA: Diagnosis not present

## 2023-10-16 DIAGNOSIS — I8510 Secondary esophageal varices without bleeding: Secondary | ICD-10-CM | POA: Diagnosis not present

## 2023-10-16 DIAGNOSIS — K439 Ventral hernia without obstruction or gangrene: Secondary | ICD-10-CM | POA: Insufficient documentation

## 2023-10-16 DIAGNOSIS — K766 Portal hypertension: Secondary | ICD-10-CM | POA: Diagnosis not present

## 2023-10-16 DIAGNOSIS — Z1211 Encounter for screening for malignant neoplasm of colon: Secondary | ICD-10-CM | POA: Insufficient documentation

## 2023-10-16 DIAGNOSIS — I10 Essential (primary) hypertension: Secondary | ICD-10-CM | POA: Diagnosis not present

## 2023-10-16 DIAGNOSIS — K648 Other hemorrhoids: Secondary | ICD-10-CM | POA: Diagnosis not present

## 2023-10-16 HISTORY — PX: ESOPHAGOGASTRODUODENOSCOPY (EGD) WITH PROPOFOL: SHX5813

## 2023-10-16 HISTORY — PX: COLONOSCOPY WITH PROPOFOL: SHX5780

## 2023-10-16 HISTORY — DX: Unspecified visual loss: H54.7

## 2023-10-16 SURGERY — COLONOSCOPY WITH PROPOFOL
Anesthesia: General

## 2023-10-16 MED ORDER — EPHEDRINE SULFATE-NACL 50-0.9 MG/10ML-% IV SOSY
PREFILLED_SYRINGE | INTRAVENOUS | Status: DC | PRN
  Administered 2023-10-16: 10 mg via INTRAVENOUS
  Administered 2023-10-16 (×3): 5 mg via INTRAVENOUS

## 2023-10-16 MED ORDER — PROPOFOL 10 MG/ML IV BOLUS
INTRAVENOUS | Status: DC | PRN
  Administered 2023-10-16 (×2): 10 mg via INTRAVENOUS
  Administered 2023-10-16: 50 mg via INTRAVENOUS

## 2023-10-16 MED ORDER — DEXMEDETOMIDINE HCL IN NACL 80 MCG/20ML IV SOLN
INTRAVENOUS | Status: DC | PRN
  Administered 2023-10-16 (×2): 4 ug via INTRAVENOUS
  Administered 2023-10-16 (×2): 8 ug via INTRAVENOUS
  Administered 2023-10-16: 4 ug via INTRAVENOUS

## 2023-10-16 MED ORDER — LIDOCAINE HCL (CARDIAC) PF 100 MG/5ML IV SOSY
PREFILLED_SYRINGE | INTRAVENOUS | Status: DC | PRN
  Administered 2023-10-16: 100 mg via INTRAVENOUS

## 2023-10-16 MED ORDER — DEXMEDETOMIDINE HCL IN NACL 80 MCG/20ML IV SOLN
INTRAVENOUS | Status: AC
  Filled 2023-10-16: qty 20

## 2023-10-16 MED ORDER — GLYCOPYRROLATE 0.2 MG/ML IJ SOLN
INTRAMUSCULAR | Status: DC | PRN
  Administered 2023-10-16: .2 mg via INTRAVENOUS

## 2023-10-16 MED ORDER — LIDOCAINE HCL (PF) 2 % IJ SOLN
INTRAMUSCULAR | Status: AC
  Filled 2023-10-16: qty 5

## 2023-10-16 MED ORDER — SODIUM CHLORIDE 0.9 % IV SOLN: INTRAVENOUS | Status: DC

## 2023-10-16 MED ORDER — PROPOFOL 500 MG/50ML IV EMUL
INTRAVENOUS | Status: DC | PRN
  Administered 2023-10-16: 130 ug/kg/min via INTRAVENOUS

## 2023-10-16 NOTE — Op Note (Signed)
[facility] Gastroenterology Patient Name: Joel Bryant Procedure Date: 10/16/2023 11:41 AM MRN: [id number] Account #: [id number] Date of Birth: Jun 30, 1961 Admit Type: Outpatient Age: 62 Room: [facility] ENDO ROOM 4 Gender: Male Note Status: Finalized Instrument Name: Nelda Marseille [id number] Procedure:             Colonoscopy Indications:           High risk colon cancer surveillance: Personal history                         of non-advanced adenoma Providers:             Boykin Nearing. Norma Fredrickson MD, MD Referring MD:          Onnie Boer. Sowles, MD (Referring MD) Medicines:             Propofol per Anesthesia Complications:         No immediate complications. Procedure:             Pre-Anesthesia Assessment:                        - The risks and benefits of the procedure and the                         sedation options and risks were discussed with the                         patient. All questions were answered and informed                         consent was obtained.                        - Patient identification and proposed procedure were                         verified prior to the procedure by the nurse. The                         procedure was verified in the procedure room.                        - ASA Grade Assessment: III - A patient with severe                         systemic disease.                        - After reviewing the risks and benefits, the patient                         was deemed in satisfactory condition to undergo the                         procedure.                        After obtaining informed consent, the colonoscope was                         passed under  direct vision. Throughout the procedure,                         the patient's blood pressure, pulse, and oxygen                         saturations were monitored continuously. The                         Colonoscope was introduced through the anus and                         advanced  to the the cecum, identified by appendiceal                         orifice and ileocecal valve. The colonoscopy was                         performed without difficulty. The patient tolerated                         the procedure well. The quality of the bowel                         preparation was good. The ileocecal valve, appendiceal                         orifice, and rectum were photographed. Findings:      The perianal and digital rectal examinations were normal. Pertinent       negatives include normal sphincter tone and no palpable rectal lesions.      Non-bleeding internal hemorrhoids were found during retroflexion. The       hemorrhoids were Grade I (internal hemorrhoids that do not prolapse).      The exam was otherwise without abnormality. Impression:            - Non-bleeding internal hemorrhoids.                        - The examination was otherwise normal.                        - No specimens collected. Recommendation:        - Patient has a contact number available for                         emergencies. The signs and symptoms of potential                         delayed complications were discussed with the patient.                         Return to normal activities tomorrow. Written                         discharge instructions were provided to the patient.                        - Resume previous diet.                        -  Continue present medications.                        - Repeat colonoscopy in 10 years for screening                         purposes.                        - Return to GI office in 3 months.                        - Telephone GI office to schedule appointment.                        - The findings and recommendations were discussed with                         the patient. Procedure Code(s):     --- Professional ---                        Y7829, Colorectal cancer screening; colonoscopy on                         individual at high  risk Diagnosis Code(s):     --- Professional ---                        K64.0, First degree hemorrhoids                        Z86.010, Personal history of colonic polyps CPT copyright 2022 American Medical Association. All rights reserved. The codes documented in this report are preliminary and upon coder review may  be revised to meet current compliance requirements. Stanton Kidney MD, MD 10/16/2023 12:14:32 PM This report has been signed electronically. Number of Addenda: 0 Note Initiated On: 10/16/2023 11:41 AM Scope Withdrawal Time: 0 hours 5 minutes 54 seconds  Total Procedure Duration: 0 hours 11 minutes 47 seconds  Estimated Blood Loss:  Estimated blood loss: none.      [facility]

## 2023-10-16 NOTE — Transfer of Care (Signed)
Immediate Anesthesia Transfer of Care Note  Patient: Joel Bryant  Procedure(s) Performed: COLONOSCOPY WITH PROPOFOL ESOPHAGOGASTRODUODENOSCOPY (EGD) WITH PROPOFOL  Patient Location: PACU  Anesthesia Type:General  Level of Consciousness: drowsy  Airway & Oxygen Therapy: Patient Spontanous Breathing  Post-op Assessment: Report given to RN and Post -op Vital signs reviewed and stable  Post vital signs: Reviewed and stable  Last Vitals:  Vitals Value Taken Time  BP    Temp    Pulse    Resp    SpO2      Last Pain:  Vitals:   10/16/23 1125  TempSrc: Temporal  PainSc: 0-No pain         Complications: No notable events documented.

## 2023-10-16 NOTE — Interval H&P Note (Deleted)
History and Physical Interval Note:  10/16/2023 11:39 AM  Joel Bryant  has presented today for surgery, with the diagnosis of 572.3 (ICD-9-CM) - K76.6 (ICD-10-CM) - Portal venous hypertension (CMS/HHS-HCC) 571.2 (ICD-9-CM) - K70.31 (ICD-10-CM) - Ascites due to alcoholic cirrhosis (CMS/HHS-HCC) 571.2 (ICD-9-CM) - K70.31 (ICD-10-CM) - Alcoholic cirrhosis of liver with ascites (CMS/HHS-HCC).  The various methods of treatment have been discussed with the patient and family. After consideration of risks, benefits and other options for treatment, the patient has consented to  Procedure(s): COLONOSCOPY WITH PROPOFOL (N/A) ESOPHAGOGASTRODUODENOSCOPY (EGD) WITH PROPOFOL (N/A) as a surgical intervention.  The patient's history has been reviewed, patient examined, no change in status, stable for surgery.  I have reviewed the patient's chart and labs.  Questions were answered to the patient's satisfaction.     [place] , [place]

## 2023-10-16 NOTE — Interval H&P Note (Signed)
History and Physical Interval Note:  10/16/2023 11:44 AM  Joel Bryant  has presented today for surgery, with the diagnosis of 572.3 (ICD-9-CM) - K76.6 (ICD-10-CM) - Portal venous hypertension (CMS/HHS-HCC) 571.2 (ICD-9-CM) - K70.31 (ICD-10-CM) - Ascites due to alcoholic cirrhosis (CMS/HHS-HCC) 571.2 (ICD-9-CM) - K70.31 (ICD-10-CM) - Alcoholic cirrhosis of liver with ascites (CMS/HHS-HCC).  The various methods of treatment have been discussed with the patient and family. After consideration of risks, benefits and other options for treatment, the patient has consented to  Procedure(s): COLONOSCOPY WITH PROPOFOL (N/A) ESOPHAGOGASTRODUODENOSCOPY (EGD) WITH PROPOFOL (N/A) as a surgical intervention.  The patient's history has been reviewed, patient examined, no change in status, stable for surgery.  I have reviewed the patient's chart and labs.  Questions were answered to the patient's satisfaction.     [place], [place]

## 2023-10-16 NOTE — Anesthesia Procedure Notes (Signed)
Date/Time: 10/16/2023 11:44 AM  Performed by: Malva Cogan, CRNAPre-anesthesia Checklist: Patient identified, Emergency Drugs available, Suction available, Patient being monitored and Timeout performed Patient Re-evaluated:Patient Re-evaluated prior to induction Oxygen Delivery Method: Simple face mask Induction Type: IV induction Placement Confirmation: CO2 detector and positive ETCO2

## 2023-10-16 NOTE — H&P (Deleted)
Outpatient short stay form Pre-procedure 10/16/2023 11:35 AM Joel Bryant, M.D.  Primary Physician: Alba Cory, M.D.  Reason for visit:  History of adenomatous colon polyps  History of present illness:  Joel Bryant presents today for gastrology consultation for diagnosis earlier this year of advanced cirrhosis. Patient was seen recently by Dr. Sterling Big for consideration of elective ventral hernia repair. Dr. Everlene Farrier opinion was that the patient's liver disease would obviate the safety of performing the surgery and that there was a unacceptably high mortality risk due to patient's liver disease. Records are reviewed and noted below. Patient has a MELD score of 21 after calculation. He has remote history of polysubstance abuse including crack cocaine, heroin and oral narcotic pain pills. He has reportedly been clean from all of these recreational drugs for over 5 years. However, patient has drank very heavily over the last 40 years and this is included 15 beers daily up until 2-1/2 weeks ago. He currently is awaiting his health care advocate to schedule rehab counseling for him. He is no longer drinking alcohol. Patient is following a regular diet although he claims he does not add salt to his meals. Like most people, he does use worse discharge sauce and other types of seasoning for his meat and eats a lot of soup and other types of things are potentially high in salt. He has undergone 2 large-volume paracenteses in the last couple of months. I have not seen the fluid studies, however.  Colonoscopy 06/03/20 - Dr. Mia Creek - Fair prep, tubular adenoma, repeat colonoscpy advised for one year. Not scheduled per patient. CBC 06/23/23 - Revealed thrombocytopenia with PLT at 91. Hgb 11.4, Hct 33.1 BMP 06/22/23 - Glc 124 (elev), BUN 8.3, Cr 0.41. Low Na at 129. Underwent CT abdomen 05/30/23: IMPRESSION: Hepatic cirrhosis and steatosis, with findings of portal venous hypertension. No radiographic evidence  of hepatic neoplasm.  New moderate ascites and diffuse mesenteric edema.  Mild diffuse gastric, small bowel and colonic wall thickening, consistent with portal hypertensive gastroenterocolopathy.  Small paraumbilical ventral hernia containing fluid and fat. No herniated bowel loops.  Electronically Signed By: Danae Orleans M.D. On: 06/04/2023 17:09 06/08/23 - Ultrasound with paracentesis -     Current Facility-Administered Medications:    0.9 %  sodium chloride infusion, , Intravenous, Continuous, [place], Joel Nearing, MD, Last Rate: 20 mL/hr at 10/16/23 1127, New Bag at 10/16/23 1127  Medications Prior to Admission  Medication Sig Dispense Refill Last Dose/Taking   atenolol (TENORMIN) 50 MG tablet Take 1 tablet (50 mg total) by mouth daily. 90 tablet 1 10/16/2023 at  5:00 AM   atorvastatin (LIPITOR) 20 MG tablet TAKE ONE TABLET BY MOUTH ONCE A DAY AT SIX IN THE EVENING 90 tablet 1 10/15/2023   Cyanocobalamin (B-12) 1000 MCG SUBL Place 1 tablet under the tongue daily. 100 tablet 1 10/15/2023   esomeprazole (NEXIUM) 40 MG capsule Take 1 capsule (40 mg total) by mouth daily. 90 capsule 1 10/15/2023   furosemide (LASIX) 40 MG tablet Take 1 tablet (40 mg total) by mouth daily. 90 tablet 0 10/16/2023 at  5:00 AM   lisinopril (ZESTRIL) 2.5 MG tablet Take 1 tablet (2.5 mg total) by mouth daily. 90 tablet 1 10/15/2023   meclizine (ANTIVERT) 25 MG tablet Take 0.5-1 tablets (12.5-25 mg total) by mouth 3 (three) times daily as needed for dizziness. 30 tablet 0 10/15/2023   omega-3 acid ethyl esters (LOVAZA) 1 g capsule Take 2 capsules (2 g total) by mouth 2 (  two) times daily. 360 capsule 1 10/15/2023   spironolactone (ALDACTONE) 100 MG tablet Take 100 mg by mouth daily.   10/15/2023   colchicine 0.6 MG tablet TAKE 1 TABLET BY MOUTH TWICE DAILY AS NEEDED. TAKE ONE AND MAY REPEAT A SECONDDOSE IN 2 HOURS, MAY STOP WHEN PAIN RESOLVES 30 tablet 1      No Known Allergies   Past Medical History:   Diagnosis Date   Anxiety    Aortic atherosclerosis (HCC) 06/13/2018   July 2019 CT scan   Blind    Chronic LBP 09/13/2015   Chronic left hip pain 09/27/2015   Diabetes mellitus without complication (HCC)    Fracture, vertebra, pathologic    Gout    Hyperlipidemia    Hypertension    Hypomagnesemia    Retinitis pigmentosa    Sleep apnea     Review of systems:  Otherwise negative.    Physical Exam  Gen: Alert, oriented. Appears stated age.  HEENT: /AT. PERRLA. Lungs: CTA, no wheezes. CV: RR nl S1, S2. Abd: soft, benign, no masses. BS+ Ext: No edema. Pulses 2+    Planned procedures: Proceed with colonoscopy. The patient understands the nature of the planned procedure, indications, risks, alternatives and potential complications including but not limited to bleeding, infection, perforation, damage to internal organs and possible oversedation/side effects from anesthesia. The patient agrees and gives consent to proceed.  Please refer to procedure notes for findings, recommendations and patient disposition/instructions.     Joel Bryant K. Norma Bryant, M.D. Gastroenterology 10/16/2023  11:35 AM

## 2023-10-16 NOTE — H&P (Signed)
Outpatient short stay form Pre-procedure 10/16/2023 11:43 AM Yocelyn Brocious K. Norma Fredrickson, M.D.  Primary Physician: Alba Cory, M.D.  Reason for visit:  Portal hypertension, hx colon polyps, cirrhosis  History of present illness:  Mr. [place] presents today for gastrology consultation for diagnosis earlier this year of advanced cirrhosis. Patient was seen recently by Dr. Sterling Big for consideration of elective ventral hernia repair. Dr. Everlene Farrier opinion was that the patient's liver disease would obviate the safety of performing the surgery and that there was a unacceptably high mortality risk due to patient's liver disease. Records are reviewed and noted below. Patient has a MELD score of 21 after calculation. He has remote history of polysubstance abuse including crack cocaine, heroin and oral narcotic pain pills. He has reportedly been clean from all of these recreational drugs for over 5 years. However, patient has drank very heavily over the last 40 years and this is included 15 beers daily up until 2-1/2 weeks ago. He currently is awaiting his health care advocate to schedule rehab counseling for him. He is no longer drinking alcohol. Patient is following a regular diet although he claims he does not add salt to his meals. Like most people, he does use worse discharge sauce and other types of seasoning for his meat and eats a lot of soup and other types of things are potentially high in salt. He has undergone 2 large-volume paracenteses in the last couple of months. I have not seen the fluid studies, however.  Colonoscopy 06/03/20 - Dr. Mia Creek - Fair prep, tubular adenoma, repeat colonoscpy advised for one year. Not scheduled per patient. CBC 06/23/23 - Revealed thrombocytopenia with PLT at 91. Hgb 11.4, Hct 33.1 BMP 06/22/23 - Glc 124 (elev), BUN 8.3, Cr 0.41. Low Na at 129. Underwent CT abdomen 05/30/23: IMPRESSION: Hepatic cirrhosis and steatosis, with findings of portal venous hypertension. No  radiographic evidence of hepatic neoplasm.  New moderate ascites and diffuse mesenteric edema.  Mild diffuse gastric, small bowel and colonic wall thickening, consistent with portal hypertensive gastroenterocolopathy.  Small paraumbilical ventral hernia containing fluid and fat. No herniated bowel loops.  Electronically Signed By: Danae Orleans M.D. On: 06/04/2023 17:09 06/08/23 - Ultrasound with paracentesis -     Current Facility-Administered Medications:    0.9 %  sodium chloride infusion, , Intravenous, Continuous, [place], Boykin Nearing, MD, Last Rate: 20 mL/hr at 10/16/23 1141, Continued from Pre-op at 10/16/23 1141  Facility-Administered Medications Ordered in Other Encounters:    glycopyrrolate (ROBINUL) injection, , Intravenous, Anesthesia Intra-op, Malva Cogan, CRNA, 0.2 mg at 10/16/23 1137  Medications Prior to Admission  Medication Sig Dispense Refill Last Dose/Taking   atenolol (TENORMIN) 50 MG tablet Take 1 tablet (50 mg total) by mouth daily. 90 tablet 1 10/16/2023 at  5:00 AM   atorvastatin (LIPITOR) 20 MG tablet TAKE ONE TABLET BY MOUTH ONCE A DAY AT SIX IN THE EVENING 90 tablet 1 10/15/2023   Cyanocobalamin (B-12) 1000 MCG SUBL Place 1 tablet under the tongue daily. 100 tablet 1 10/15/2023   esomeprazole (NEXIUM) 40 MG capsule Take 1 capsule (40 mg total) by mouth daily. 90 capsule 1 10/15/2023   furosemide (LASIX) 40 MG tablet Take 1 tablet (40 mg total) by mouth daily. 90 tablet 0 10/16/2023 at  5:00 AM   lisinopril (ZESTRIL) 2.5 MG tablet Take 1 tablet (2.5 mg total) by mouth daily. 90 tablet 1 10/15/2023   meclizine (ANTIVERT) 25 MG tablet Take 0.5-1 tablets (12.5-25 mg total) by mouth 3 (three) times daily  as needed for dizziness. 30 tablet 0 10/15/2023   omega-3 acid ethyl esters (LOVAZA) 1 g capsule Take 2 capsules (2 g total) by mouth 2 (two) times daily. 360 capsule 1 10/15/2023   spironolactone (ALDACTONE) 100 MG tablet Take 100 mg by mouth daily.    10/15/2023   colchicine 0.6 MG tablet TAKE 1 TABLET BY MOUTH TWICE DAILY AS NEEDED. TAKE ONE AND MAY REPEAT A SECONDDOSE IN 2 HOURS, MAY STOP WHEN PAIN RESOLVES 30 tablet 1      No Known Allergies   Past Medical History:  Diagnosis Date   Anxiety    Aortic atherosclerosis (HCC) 06/13/2018   July 2019 CT scan   Blind    Chronic LBP 09/13/2015   Chronic left hip pain 09/27/2015   Diabetes mellitus without complication (HCC)    Fracture, vertebra, pathologic    Gout    Hyperlipidemia    Hypertension    Hypomagnesemia    Retinitis pigmentosa    Sleep apnea     Review of systems:  Otherwise negative.    Physical Exam  Gen: Alert, oriented. Appears stated age.  HEENT: [place]/AT. PERRLA. Lungs: CTA, no wheezes. CV: RR nl S1, S2. Abd: soft, benign, no masses. BS+ Ext: No edema. Pulses 2+    Planned procedures: Proceed with EGD and colonoscopy. The patient understands the nature of the planned procedure, indications, risks, alternatives and potential complications including but not limited to bleeding, infection, perforation, damage to internal organs and possible oversedation/side effects from anesthesia. The patient agrees and gives consent to proceed.  Please refer to procedure notes for findings, recommendations and patient disposition/instructions.     Sawsan Riggio K. Norma Fredrickson, M.D. Gastroenterology 10/16/2023  11:43 AM

## 2023-10-16 NOTE — Op Note (Signed)
[facility] Patient Name: Joel Bryant Procedure Date: 10/16/2023 11:43 AM MRN: [id number] Account #: [id number] Date of Birth: 1960/11/24 Admit Type: Outpatient Age: 62 Room: [facility] ENDO ROOM 4 Gender: Male Note Status: Finalized Instrument Name: Upper Endoscope [id number] Procedure:             Upper GI endoscopy Indications:           Portal venous hypertension Providers:             Royce Macadamia K. Norma Fredrickson MD, MD Referring MD:          Onnie Boer. Sowles, MD (Referring MD) Medicines:             Propofol per Anesthesia Complications:         No immediate complications. Estimated blood loss: None. Procedure:             Pre-Anesthesia Assessment:                        - The risks and benefits of the procedure and the                         sedation options and risks were discussed with the                         patient. All questions were answered and informed                         consent was obtained.                        - Patient identification and proposed procedure were                         verified prior to the procedure by the nurse. The                         procedure was verified in the procedure room.                        - ASA Grade Assessment: III - A patient with severe                         systemic disease.                        - After reviewing the risks and benefits, the patient                         was deemed in satisfactory condition to undergo the                         procedure.                        After obtaining informed consent, the endoscope was                         passed under direct vision. Throughout the procedure,  the patient's blood pressure, pulse, and oxygen                         saturations were monitored continuously. The Endoscope                         was introduced through the mouth, and advanced to the                         third part of duodenum. The  upper GI endoscopy was                         accomplished without difficulty. The patient tolerated                         the procedure well. Findings:      Grade I varices were found in the lower third of the esophagus.       Estimated blood loss: none.      The exam of the esophagus was otherwise normal.      Moderate portal hypertensive gastropathy was found in the entire       examined stomach.      There is no endoscopic evidence of ulceration or varices in the entire       examined stomach.      The examined duodenum was normal. Impression:            - Grade I esophageal varices.                        - Portal hypertensive gastropathy.                        - Normal examined duodenum.                        - No specimens collected. Recommendation:        - Repeat upper endoscopy in 1 year for surveillance.                        - Return to GI office at the next available                         appointment.                        - Proceed with colonoscopy Procedure Code(s):     --- Professional ---                        [phone], Esophagogastroduodenoscopy, flexible,                         transoral; diagnostic, including collection of                         specimen(s) by brushing or washing, when performed                         (separate procedure) Diagnosis Code(s):     --- Professional ---  K31.89, Other diseases of stomach and duodenum                        K76.6, Portal hypertension                        I85.00, Esophageal varices without bleeding CPT copyright 2022 American [employer] Association. All rights reserved. The codes documented in this report are preliminary and upon coder review may  be revised to meet current compliance requirements. Stanton Kidney MD, MD 10/16/2023 11:56:59 AM This report has been signed electronically. Number of Addenda: 0 Note Initiated On: 10/16/2023 11:43 AM Estimated Blood Loss:  Estimated blood  loss: none.      [facility]

## 2023-10-17 ENCOUNTER — Encounter: Payer: Self-pay | Admitting: Internal Medicine

## 2023-10-18 ENCOUNTER — Encounter: Payer: Self-pay | Admitting: Internal Medicine

## 2023-10-18 NOTE — Anesthesia Preprocedure Evaluation (Signed)
Anesthesia Evaluation  Patient identified by MRN, date of birth, ID band Patient awake    Reviewed: Allergy & Precautions, H&P , NPO status , Patient's Chart, lab work & pertinent test results  History of Anesthesia Complications Negative for: history of anesthetic complications  Airway Mallampati: III  TM Distance: <3 FB Neck ROM: limited    Dental  (+) Chipped, Poor Dentition, Dental Advidsory Given   Pulmonary neg shortness of breath, sleep apnea (history of) , neg COPD, neg recent URI, Current Smoker, former smoker          Cardiovascular hypertension, (-) angina (-) Past MI and (-) DOE (-) dysrhythmias (-) Valvular Problems/Murmurs     Neuro/Psych neg Seizures PSYCHIATRIC DISORDERS Anxiety      Neuromuscular disease    GI/Hepatic Neg liver ROS,GERD  Medicated and Controlled,,  Endo/Other  diabetes, Well Controlled, Type 2    Renal/GU negative Renal ROS  negative genitourinary   Musculoskeletal  (+) Arthritis ,    Abdominal   Peds  Hematology negative hematology ROS (+)   Anesthesia Other Findings Past Medical History: No date: Anxiety 06/13/2018: Aortic atherosclerosis (HCC)     Comment:  July 2019 CT scan 09/13/2015: Chronic LBP 09/27/2015: Chronic left hip pain No date: Diabetes mellitus without complication (HCC) No date: Fracture, vertebra, pathologic No date: GERD (gastroesophageal reflux disease) No date: Hyperlipidemia No date: Hyperlipidemia No date: Hypertension No date: Hypomagnesemia  Past Surgical History: 1992: CATARACT EXTRACTION     Comment:  Insert Prosthetic Lens 02/28/2017: COLONOSCOPY WITH PROPOFOL; N/A     Comment:  Procedure: COLONOSCOPY WITH PROPOFOL;  Surgeon: Scot Jun, MD;  Location: [facility] ENDOSCOPY;  Service:               Endoscopy;  Laterality: N/A; No date: EYE SURGERY 2001: LAMINECTOMY     Comment:  lumbar No date: MULTIPLE TOOTH EXTRACTIONS      Comment:  2018 No date: TIBIA FRACTURE SURGERY No date: TONSILLECTOMY No date: WRIST SURGERY; Right     Comment:  x2  BMI    Body Mass Index:  32.49 kg/m      Reproductive/Obstetrics negative OB ROS                              Anesthesia Physical Anesthesia Plan  ASA: III  Anesthesia Plan: General   Post-op Pain Management:    Induction: Intravenous  PONV Risk Score and Plan: Propofol infusion and TIVA  Airway Management Planned: Natural Airway and Nasal Cannula  Additional Equipment:   Intra-op Plan:   Post-operative Plan:   Informed Consent: I have reviewed the patients History and Physical, chart, labs and discussed the procedure including the risks, benefits and alternatives for the proposed anesthesia with the patient or authorized representative who has indicated his/her understanding and acceptance.     Dental Advisory Given  Plan Discussed with: Anesthesiologist, CRNA and Surgeon  Anesthesia Plan Comments: (Patient consented for risks of anesthesia including but not limited to:  - adverse reactions to medications - risk of intubation if required - damage to teeth, lips or other oral mucosa - sore throat or hoarseness - Damage to heart, brain, lungs or loss of life  Patient voiced understanding.)         Anesthesia Quick Evaluation

## 2023-10-18 NOTE — Anesthesia Postprocedure Evaluation (Signed)
Anesthesia Post Note  Patient: HOSKIE DELOE  Procedure(s) Performed: COLONOSCOPY WITH PROPOFOL ESOPHAGOGASTRODUODENOSCOPY (EGD) WITH PROPOFOL  Patient location during evaluation: Endoscopy Anesthesia Type: General Level of consciousness: awake and alert Pain management: pain level controlled Vital Signs Assessment: post-procedure vital signs reviewed and stable Respiratory status: spontaneous breathing, nonlabored ventilation, respiratory function stable and patient connected to nasal cannula oxygen Cardiovascular status: blood pressure returned to baseline and stable Postop Assessment: no apparent nausea or vomiting Anesthetic complications: no   No notable events documented.   Last Vitals:  Vitals:   10/16/23 1221 10/16/23 1224  BP: 92/72 111/74  Pulse: 71 74  Resp: 17 14  Temp:    SpO2: 96% 98%    Last Pain:  Vitals:   10/17/23 0722  TempSrc:   PainSc: 0-No pain                 Lenard Simmer

## 2023-11-14 ENCOUNTER — Other Ambulatory Visit: Payer: Self-pay | Admitting: Family Medicine

## 2023-11-14 DIAGNOSIS — R600 Localized edema: Secondary | ICD-10-CM

## 2023-12-13 ENCOUNTER — Other Ambulatory Visit: Payer: Self-pay | Admitting: Family Medicine

## 2023-12-13 DIAGNOSIS — R600 Localized edema: Secondary | ICD-10-CM

## 2023-12-19 ENCOUNTER — Ambulatory Visit: Payer: Self-pay | Admitting: Family Medicine

## 2024-01-10 ENCOUNTER — Other Ambulatory Visit: Payer: Self-pay | Admitting: Family Medicine

## 2024-01-10 DIAGNOSIS — R600 Localized edema: Secondary | ICD-10-CM

## 2024-01-10 NOTE — Telephone Encounter (Signed)
 Last f/u 07/2023

## 2024-01-11 ENCOUNTER — Telehealth: Payer: Self-pay | Admitting: Family Medicine

## 2024-01-11 ENCOUNTER — Other Ambulatory Visit: Payer: Self-pay | Admitting: Family Medicine

## 2024-01-11 DIAGNOSIS — R600 Localized edema: Secondary | ICD-10-CM

## 2024-01-11 MED ORDER — FUROSEMIDE 40 MG PO TABS: 40.0000 mg | ORAL_TABLET | Freq: Every day | ORAL | 0 refills | Status: DC

## 2024-01-11 NOTE — Telephone Encounter (Signed)
 Copied from CRM [id number]. Topic: Clinical - Medication Question >> Jan 11, 2024 11:17 AM DeAngela L wrote: Reason for CRM: Patient called with questions about his prescription request by the pharm cause he will be out of medicine on Monday before his appointment on 02/06/24 and this is a prescription the the liver dr wants him to take, Pt states the pharmacy sent over a request to the Dr office for furosemide (LASIX) 40 MG tablet And have not received a response

## 2024-01-14 NOTE — Telephone Encounter (Signed)
 Pt notified- verbalized understanding.

## 2024-02-06 ENCOUNTER — Ambulatory Visit: Payer: 59 | Admitting: Family Medicine

## 2024-02-06 ENCOUNTER — Encounter: Payer: Self-pay | Admitting: Family Medicine

## 2024-02-06 VITALS — BP 130/74 | HR 48 | Resp 16 | Ht 68.0 in | Wt 204.8 lb

## 2024-02-06 DIAGNOSIS — F1021 Alcohol dependence, in remission: Secondary | ICD-10-CM

## 2024-02-06 DIAGNOSIS — K7031 Alcoholic cirrhosis of liver with ascites: Secondary | ICD-10-CM

## 2024-02-06 DIAGNOSIS — J410 Simple chronic bronchitis: Secondary | ICD-10-CM

## 2024-02-06 DIAGNOSIS — K766 Portal hypertension: Secondary | ICD-10-CM

## 2024-02-06 DIAGNOSIS — G319 Degenerative disease of nervous system, unspecified: Secondary | ICD-10-CM

## 2024-02-06 DIAGNOSIS — Z23 Encounter for immunization: Secondary | ICD-10-CM

## 2024-02-06 DIAGNOSIS — E538 Deficiency of other specified B group vitamins: Secondary | ICD-10-CM

## 2024-02-06 DIAGNOSIS — I700 Atherosclerosis of aorta: Secondary | ICD-10-CM

## 2024-02-06 DIAGNOSIS — E1140 Type 2 diabetes mellitus with diabetic neuropathy, unspecified: Secondary | ICD-10-CM

## 2024-02-06 DIAGNOSIS — K3189 Other diseases of stomach and duodenum: Secondary | ICD-10-CM

## 2024-02-06 DIAGNOSIS — I10 Essential (primary) hypertension: Secondary | ICD-10-CM

## 2024-02-06 DIAGNOSIS — I8500 Esophageal varices without bleeding: Secondary | ICD-10-CM

## 2024-02-06 DIAGNOSIS — K219 Gastro-esophageal reflux disease without esophagitis: Secondary | ICD-10-CM

## 2024-02-06 DIAGNOSIS — E1159 Type 2 diabetes mellitus with other circulatory complications: Secondary | ICD-10-CM

## 2024-02-06 DIAGNOSIS — D696 Thrombocytopenia, unspecified: Secondary | ICD-10-CM

## 2024-02-06 HISTORY — DX: Gastro-esophageal reflux disease without esophagitis: K21.9

## 2024-02-06 LAB — POCT GLYCOSYLATED HEMOGLOBIN (HGB A1C): Hemoglobin A1C: 6.5 % — AB (ref 4.0–5.6)

## 2024-02-06 MED ORDER — ATORVASTATIN CALCIUM 20 MG PO TABS: ORAL_TABLET | ORAL | 1 refills | Status: DC

## 2024-02-06 MED ORDER — LISINOPRIL 5 MG PO TABS: 5.0000 mg | ORAL_TABLET | Freq: Every day | ORAL | 1 refills | Status: DC

## 2024-02-06 MED ORDER — LISINOPRIL 2.5 MG PO TABS
2.5000 mg | ORAL_TABLET | Freq: Every day | ORAL | 1 refills | Status: DC
Start: 2024-02-06 — End: 2024-02-06

## 2024-02-06 MED ORDER — OMEGA-3-ACID ETHYL ESTERS 1 G PO CAPS: 2.0000 g | ORAL_CAPSULE | Freq: Two times a day (BID) | ORAL | 1 refills | Status: DC

## 2024-02-06 MED ORDER — ATENOLOL 25 MG PO TABS: 25.0000 mg | ORAL_TABLET | Freq: Every day | ORAL | 1 refills | Status: DC

## 2024-02-06 MED ORDER — ESOMEPRAZOLE MAGNESIUM 40 MG PO CPDR: 40.0000 mg | DELAYED_RELEASE_CAPSULE | Freq: Every day | ORAL | 1 refills | Status: DC

## 2024-02-06 MED ORDER — FUROSEMIDE 40 MG PO TABS: 40.0000 mg | ORAL_TABLET | Freq: Every day | ORAL | 1 refills | Status: DC

## 2024-02-06 NOTE — Progress Notes (Signed)
 Name: Joel Bryant   MRN: [id number]    DOB: 1961-10-25   Date:02/06/2024       Progress Note  Subjective  Chief Complaint  Chief Complaint  Patient presents with   Medical Management of Chronic Issues   HPI   DM II with complications. diagnosed years ago, he was on Januvia and Metformin but off medications for years now.  A1C has been at goal, today it was 6.5 % He still has some neuropathy/paresthesia on toes but stable and mild,  he still has microalbuminuria and is on ACE inhibitor, last year we decreased dose of lisinopril but bp today is 134.  Denies polyphagia, polyuria or polydipsia .   Alcoholic liver cirrhosis:  he drank heavily for many years, he was diagnosed with liver cirrhosis this Summer and tried to quit cold [place], he felt very paranoid, was hospitalized and states no alcohol in the past 2 months. He is seeing Dr. Norma Fredrickson, he states he will go to counselor as recommended to get into a alcohol support group to be eligible for liver transplant. He is now on spironolactone    Aorta Atherosclerosis: found  on CT abdomen done 05/22/2018 , he is back on statin therapy and we will recheck labs today.    History of drug abuse: he states he has used all kinds of drugs, including cocaine, heroine, he states he quit all illegal drugs in 2019  He states cannot go to the pain clinic because of his history of drug addiction and is in constant  pain - on his back but the pain is no longer severe .  Unchanged    HTN: he denies chest pain or palpitation, BP at home has been lower since he quit drinking, today he is bradycardic , we will decrease dose of atenolol to 25 mg daily and increase dose of lisinopril to 5 mg daily    Smoker's cough: he started smoking at age 63 , quit for 9 years and resumed smoking in 2022, he smokes less than one pack per day now.  He coughs daily and symptoms are stable. 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 , mother drove him to our office today but waited at the lobby    Brain Atrophy: found on CT brain, he is no longer drinking alcohol , he is back on statin therapy     Patient Active Problem List   Diagnosis Date Noted   Overweight (BMI 25.0-29.9) 06/21/2023   Alcoholic cirrhosis of liver with ascites (HCC) 06/20/2023   Hypokalemia 06/20/2023   Anemia 06/20/2023   Hyponatremia 06/20/2023   Acute alcoholic hallucinosis (HCC) 06/19/2023   AMS (altered mental status) 06/19/2023   Cerebral atrophy (HCC) 05/16/2023   Thrombocytopenia (HCC) 05/16/2023   Alcoholism (HCC) 05/16/2023   Periumbilical hernia 05/16/2023   GAD (generalized anxiety disorder) 11/20/2020   History of drug abuse in remission (HCC) 04/30/2020   Closed compression fracture of thoracic vertebra (HCC) 06/13/2018   Fatty liver 06/13/2018   Aortic atherosclerosis (HCC) 06/13/2018   Chronic pain syndrome 09/06/2016   Dyslipidemia associated with type 2 diabetes mellitus (HCC) 09/27/2015   H/O adenomatous polyp of colon 09/27/2015   Dysmetabolic syndrome 09/27/2015   Post Laminectomy Syndrome (L4-5 Hemilaminectomy and partial Facetectomy) 09/27/2015   Lumbar spondylosis 09/27/2015   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   Epidural fibrosis 09/27/2015   Chronic low back pain (  Location of Primary Source of Pain) (Bilateral) (L>R) 09/27/2015   Hypomagnesemia 09/27/2015   Chronic left knee pain 09/27/2015   Lumbar spinal stenosis (L4-5) 09/27/2015   Lumbar foraminal stenosis (Bilateral L3-4 and L4-5) 09/27/2015   Lumbar facet hypertrophy (Bilateral, Multilevel) 09/27/2015   Lumbar facet syndrome (Bilateral) 09/27/2015   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) 09/27/2015   Chronic hip pain (Location of Secondary source of pain) (Bilateral) (L>R) (secondary to osteonecrosis) 09/27/2015   Edema leg 09/13/2015   Gout 09/13/2015   Obstructive apnea 09/13/2015   Anxiety  disorder 06/11/2015   Hypertriglyceridemia 06/11/2015   Dyslipidemia 06/11/2015   Controlled type 2 diabetes mellitus with diabetic neuropathy, without long-term current use of insulin (HCC) 06/11/2015   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   Retinitis pigmentosa 06/11/2015    Past Surgical History:  Procedure Laterality Date   BACK SURGERY  2001   laminectomy lumbar spine   CATARACT EXTRACTION  1992   Insert Prosthetic Lens   COLONOSCOPY WITH PROPOFOL N/A 02/28/2017   Procedure: COLONOSCOPY WITH PROPOFOL;  Surgeon: Scot Jun, MD;  Location: [facility] ENDOSCOPY;  Service: Endoscopy;  Laterality: N/A;   COLONOSCOPY WITH PROPOFOL N/A 06/03/2020   Procedure: COLONOSCOPY WITH PROPOFOL;  Surgeon: Regis Bill, MD;  Location: [facility] ENDOSCOPY;  Service: Endoscopy;  Laterality: N/A;   COLONOSCOPY WITH PROPOFOL N/A 10/16/2023   Procedure: COLONOSCOPY WITH PROPOFOL;  Surgeon: Toledo, Boykin Nearing, MD;  Location: [facility] ENDOSCOPY;  Service: Gastroenterology;  Laterality: N/A;   ESOPHAGOGASTRODUODENOSCOPY (EGD) WITH PROPOFOL N/A 10/16/2023   Procedure: ESOPHAGOGASTRODUODENOSCOPY (EGD) WITH PROPOFOL;  Surgeon: Toledo, Boykin Nearing, MD;  Location: [facility] ENDOSCOPY;  Service: Gastroenterology;  Laterality: N/A;   EYE SURGERY     KYPHOPLASTY N/A 08/08/2018   Procedure: [id number];  Surgeon: Kennedy Bucker, MD;  Location: [facility] ORS;  Service: Orthopedics;  Laterality: N/A;   LAMINECTOMY  2001   lumbar   MULTIPLE TOOTH EXTRACTIONS     2018   TIBIA FRACTURE SURGERY     TONSILLECTOMY     WRIST SURGERY Right    x2    Family History  Problem Relation Age of Onset   Colon polyps Mother    Retinitis pigmentosa Mother    Retinitis pigmentosa Maternal Grandmother    Cancer Maternal Grandfather    Diabetes Paternal Grandmother    Alcohol abuse Father     Social History   Tobacco Use   Smoking status: Some Days    Current packs/day: 0.75    Average packs/day: 0.8 packs/day for 35.0 years (26.3  ttl pk-yrs)    Types: Cigarettes   Smokeless tobacco: Never   Tobacco comments:    Quit smoking for 9 years, started again Feb 2022    Started at age 23 and smokes for a total of 30 plus years   Substance Use Topics   Alcohol use: Yes    Comment: 15 beers per day, quit 05/2023     Current Outpatient Medications:    Cyanocobalamin (B-12) 1000 MCG SUBL, Place 1 tablet under the tongue daily., Disp: 100 tablet, Rfl: 1   spironolactone (ALDACTONE) 100 MG tablet, Take 100 mg by mouth daily., Disp: , Rfl:    atenolol (TENORMIN) 25 MG tablet, Take 1 tablet (25 mg total) by mouth daily., Disp: 90 tablet, Rfl: 1   atorvastatin (LIPITOR) 20 MG tablet, TAKE ONE TABLET BY MOUTH ONCE A DAY AT SIX IN THE EVENING, Disp: 90 tablet, Rfl: 1   colchicine 0.6 MG tablet, TAKE 1 TABLET  BY MOUTH TWICE DAILY AS NEEDED. TAKE ONE AND MAY REPEAT A SECONDDOSE IN 2 HOURS, MAY STOP WHEN PAIN RESOLVES (Patient not taking: Reported on 02/06/2024), Disp: 30 tablet, Rfl: 1   esomeprazole (NEXIUM) 40 MG capsule, Take 1 capsule (40 mg total) by mouth daily., Disp: 90 capsule, Rfl: 1   furosemide (LASIX) 40 MG tablet, Take 1 tablet (40 mg total) by mouth daily., Disp: 90 tablet, Rfl: 1   lisinopril (ZESTRIL) 5 MG tablet, Take 1 tablet (5 mg total) by mouth daily., Disp: 90 tablet, Rfl: 1   omega-3 acid ethyl esters (LOVAZA) 1 g capsule, Take 2 capsules (2 g total) by mouth 2 (two) times daily., Disp: 360 capsule, Rfl: 1  No Known Allergies  I personally reviewed active problem list, medication list, allergies, family history with the patient/caregiver today.   ROS  Ten systems reviewed and is negative except as mentioned in HPI    Objective Physical Exam Constitutional: Patient appears well-developed and well-nourished.  No distress.  HEENT: head atraumatic, normocephalic, pupils equal and reactive to light, neck supple Cardiovascular: Normal rate, regular rhythm and normal heart sounds.  No murmur heard. No BLE  edema. Pulmonary/Chest: Effort normal and breath sounds normal. No respiratory distress. Abdominal: Soft.  There is no tenderness. Small umbilical hernia  Psychiatric: Patient has a normal mood and affect. behavior is normal. Judgment and thought content normal.   Vitals:   02/06/24 0924  BP: 130/74  Pulse: (!) 48  Resp: 16  SpO2: 99%  Weight: 204 lb 12.8 oz (92.9 kg)  Height: 5\' 8"  (1.727 m)    Body mass index is 31.14 kg/m.  Recent Results (from the past 2160 hours)  POCT glycosylated hemoglobin (Hb A1C)     Status: Abnormal   Collection Time: 02/06/24  9:31 AM  Result Value Ref Range   Hemoglobin A1C 6.5 (A) 4.0 - 5.6 %   HbA1c POC (<> result, manual entry)     HbA1c, POC (prediabetic range)     HbA1c, POC (controlled diabetic range)      Diabetic Foot Exam:     PHQ2/9:    02/06/2024    9:23 AM 08/23/2023   10:18 AM 07/03/2023    2:45 PM 05/16/2023   10:38 AM 03/09/2023    1:35 PM  Depression screen PHQ 2/9  Decreased Interest 0 0 0 0 0  Down, Depressed, Hopeless 0 0 0 0 0  PHQ - 2 Score 0 0 0 0 0  Altered sleeping 0 0 0 0 0  Tired, decreased energy 0 0 0 0 0  Change in appetite 0 0 0 0 0  Feeling bad or failure about yourself  0 0 0 0 0  Trouble concentrating 0 0 0 0 0  Moving slowly or fidgety/restless 0 0 0 0 0  Suicidal thoughts 0 0 0 0 0  PHQ-9 Score 0 0 0 0 0  Difficult doing work/chores Not difficult at all  Not difficult at all  Not difficult at all    phq 9 is negative  Fall Risk:    08/23/2023   10:18 AM 07/03/2023    2:44 PM 05/16/2023   10:38 AM 03/09/2023    1:34 PM 11/21/2022    2:03 PM  Fall Risk   Falls in the past year? 1 1 0 0 0  Number falls in past yr: 0 0 0 0 0  Injury with Fall? 0 0 0 0 0  Risk for fall due to :  Impaired balance/gait;Impaired vision Impaired balance/gait Impaired vision No Fall Risks No Fall Risks  Follow up Falls prevention discussed Falls prevention discussed;Education provided;Falls evaluation completed Falls  prevention discussed Falls prevention discussed;Education provided;Falls evaluation completed Falls prevention discussed;Education provided;Falls evaluation completed     Assessment & Plan  1. Controlled type 2 diabetes mellitus with diabetic neuropathy, without long-term current use of insulin (HCC) (Primary)  - POCT glycosylated hemoglobin (Hb A1C) - Microalbumin / creatinine urine ratio  2. Cerebral atrophy [facility])  He states memory is better since he quit drinking but still forgetful  3. Smokers' cough (HCC)  He refuses inhalers   4. Aortic atherosclerosis (HCC)  - Lipid panel - atorvastatin (LIPITOR) 20 MG tablet; TAKE ONE TABLET BY MOUTH ONCE A DAY AT SIX IN THE EVENING  Dispense: 90 tablet; Refill: 1 - omega-3 acid ethyl esters (LOVAZA) 1 g capsule; Take 2 capsules (2 g total) by mouth 2 (two) times daily.  Dispense: 360 capsule; Refill: 1  5. Alcoholic cirrhosis of liver with ascites (HCC)  Managed by Dr. Norma Fredrickson, taking spironolactone and lasix  6. Chronic alcoholism in remission (HCC)  - furosemide (LASIX) 40 MG tablet; Take 1 tablet (40 mg total) by mouth daily.  Dispense: 90 tablet; Refill: 1  7. Thrombocytopenia (HCC)  - CBC with Differential/Platelet  8. Portal hypertensive gastropathy (HCC)  Under the care of Dr. Norma Fredrickson  9. Esophageal varices determined by endoscopy [facility])  Discussed results with patient   10. Essential hypertension  - Comprehensive metabolic panel with GFR - atenolol (TENORMIN) 25 MG tablet; Take 1 tablet (25 mg total) by mouth daily.  Dispense: 90 tablet; Refill: 1 - lisinopril (ZESTRIL) 5 MG tablet; Take 1 tablet (5 mg total) by mouth daily.  Dispense: 90 tablet; Refill: 1  11. Gastroesophageal reflux disease without esophagitis  - esomeprazole (NEXIUM) 40 MG capsule; Take 1 capsule (40 mg total) by mouth daily.  Dispense: 90 capsule; Refill: 1  12. B12 deficiency  - B12 and Folate Panel  13. Need for vaccination with  20-polyvalent pneumococcal conjugate vaccine  - Pneumococcal conjugate vaccine 20-valent (Prevnar 20)

## 2024-02-06 NOTE — Addendum Note (Signed)
 Addended by: Alba Cory F on: 02/06/2024 10:28 AM   Modules accepted: Level of Service

## 2024-02-07 ENCOUNTER — Encounter: Payer: Self-pay | Admitting: Family Medicine

## 2024-02-07 LAB — COMPREHENSIVE METABOLIC PANEL WITH GFR
AG Ratio: 1.5 (calc) (ref 1.0–2.5)
ALT: 9 U/L (ref 9–46)
AST: 20 U/L (ref 10–35)
Albumin: 4.5 g/dL (ref 3.6–5.1)
Alkaline phosphatase (APISO): 91 U/L (ref 35–144)
BUN/Creatinine Ratio: 35 (calc) — ABNORMAL HIGH (ref 6–22)
BUN: 30 mg/dL — ABNORMAL HIGH (ref 7–25)
CO2: 30 mmol/L (ref 20–32)
Calcium: 9.3 mg/dL (ref 8.6–10.3)
Chloride: 96 mmol/L — ABNORMAL LOW (ref 98–110)
Creat: 0.85 mg/dL (ref 0.70–1.35)
Globulin: 3.1 g/dL (ref 1.9–3.7)
Glucose, Bld: 123 mg/dL — ABNORMAL HIGH (ref 65–99)
Potassium: 4.6 mmol/L (ref 3.5–5.3)
Sodium: 133 mmol/L — ABNORMAL LOW (ref 135–146)
Total Bilirubin: 1.2 mg/dL (ref 0.2–1.2)
Total Protein: 7.6 g/dL (ref 6.1–8.1)
eGFR: 98 mL/min/{1.73_m2} (ref >=60)

## 2024-02-07 LAB — CBC WITH DIFFERENTIAL/PLATELET
Absolute Lymphocytes: 1739 {cells}/uL (ref 850–3900)
Absolute Monocytes: 949 {cells}/uL (ref 200–950)
Basophils Absolute: 50 {cells}/uL (ref 0–200)
Basophils Relative: 0.6 %
Eosinophils Absolute: 277 {cells}/uL (ref 15–500)
Eosinophils Relative: 3.3 %
HCT: 39.4 % (ref 38.5–50.0)
Hemoglobin: 13.7 g/dL (ref 13.2–17.1)
MCH: 30.5 pg (ref 27.0–33.0)
MCHC: 34.8 g/dL (ref 32.0–36.0)
MCV: 87.8 fL (ref 80.0–100.0)
MPV: 12.1 fL (ref 7.5–12.5)
Monocytes Relative: 11.3 %
Neutro Abs: 5384 {cells}/uL (ref 1500–7800)
Neutrophils Relative %: 64.1 %
Platelets: 101 10*3/uL — ABNORMAL LOW (ref 140–400)
RBC: 4.49 10*6/uL (ref 4.20–5.80)
RDW: 13.2 % (ref 11.0–15.0)
Total Lymphocyte: 20.7 %
WBC: 8.4 10*3/uL (ref 3.8–10.8)

## 2024-02-07 LAB — LIPID PANEL
Cholesterol: 156 mg/dL (ref <200)
HDL: 49 mg/dL (ref >=40)
LDL Cholesterol (Calc): 89 mg/dL
Non-HDL Cholesterol (Calc): 107 mg/dL (ref <130)
Total CHOL/HDL Ratio: 3.2 (calc) (ref <5.0)
Triglycerides: 88 mg/dL (ref <150)

## 2024-02-07 LAB — B12 AND FOLATE PANEL
Folate: 9.4 ng/mL
Vitamin B-12: 1471 pg/mL — ABNORMAL HIGH (ref 200–1100)

## 2024-02-07 LAB — MICROALBUMIN / CREATININE URINE RATIO
Creatinine, Urine: 95 mg/dL (ref 20–320)
Microalb, Ur: <0.2 mg/dL

## 2024-02-14 ENCOUNTER — Ambulatory Visit

## 2024-02-14 VITALS — Ht 68.0 in | Wt 204.0 lb

## 2024-02-14 DIAGNOSIS — Z0000 Encounter for general adult medical examination without abnormal findings: Secondary | ICD-10-CM

## 2024-02-14 NOTE — Progress Notes (Signed)
 Please attest and cosign this visit due to patients primary care provider not being in the office at the time the visit was completed.  Because this visit was a virtual/telehealth visit,  certain criteria was not obtained, such a blood pressure, CBG if applicable, and timed get up and go. Any medications not marked as "taking" were not mentioned during the medication reconciliation part of the visit. Any vitals not documented were not able to be obtained due to this being a telehealth visit or patient was unable to self-report a recent blood pressure reading due to a lack of equipment at home via telehealth. Vitals that have been documented are verbally provided by the patient.   Subjective:   Joel Bryant is a 63 y.o. who presents for a Medicare Wellness preventive visit.  Visit Complete: Virtual I connected with  [name] on 02/14/24 by a audio enabled telemedicine application and verified that I am speaking with the correct person using two identifiers.  Patient Location: Home  Provider Location: Home Office  I discussed the limitations of evaluation and management by telemedicine. The patient expressed understanding and agreed to proceed.  Vital Signs: Because this visit was a virtual/telehealth visit, some criteria may be missing or patient reported. Any vitals not documented were not able to be obtained and vitals that have been documented are patient reported.  VideoDeclined- This patient declined Librarian, academic. Therefore the visit was completed with audio only.  Persons Participating in Visit: Patient.  AWV Questionnaire: No: Patient Medicare AWV questionnaire was not completed prior to this visit.  Cardiac Risk Factors include: advanced age (>74men, >12 women);diabetes mellitus;dyslipidemia;hypertension;male gender;obesity (BMI >30kg/m2);smoking/ tobacco exposure     Objective:    Today's Vitals   02/14/24 1011  Weight: 204 lb (92.5  kg)  Height: 5\' 8"  (1.727 m)   Body mass index is 31.02 kg/m.     02/14/2024   10:37 AM 10/16/2023   11:22 AM 06/20/2023    9:00 AM 06/19/2023    9:26 PM 03/13/2023    8:19 AM 03/25/2022   12:05 PM 11/17/2021    2:21 PM  Advanced Directives  Does Patient Have a Medical Advance Directive? No No No No No No No  Would patient like information on creating a medical advance directive? No - Patient declined No - Patient declined No - Patient declined  No - Patient declined  No - Patient declined    Current Medications (verified) Outpatient Encounter Medications as of 02/14/2024  Medication Sig   atenolol (TENORMIN) 25 MG tablet Take 1 tablet (25 mg total) by mouth daily.   atorvastatin (LIPITOR) 20 MG tablet TAKE ONE TABLET BY MOUTH ONCE A DAY AT SIX IN THE EVENING   Cyanocobalamin (B-12) 1000 MCG SUBL Place 1 tablet under the tongue daily.   esomeprazole (NEXIUM) 40 MG capsule Take 1 capsule (40 mg total) by mouth daily.   furosemide (LASIX) 40 MG tablet Take 1 tablet (40 mg total) by mouth daily.   lisinopril (ZESTRIL) 5 MG tablet Take 1 tablet (5 mg total) by mouth daily.   omega-3 acid ethyl esters (LOVAZA) 1 g capsule Take 2 capsules (2 g total) by mouth 2 (two) times daily.   spironolactone (ALDACTONE) 100 MG tablet Take 100 mg by mouth daily.   colchicine 0.6 MG tablet TAKE 1 TABLET BY MOUTH TWICE DAILY AS NEEDED. TAKE ONE AND MAY REPEAT A SECONDDOSE IN 2 HOURS, MAY STOP WHEN PAIN RESOLVES (Patient not taking: Reported  on 02/14/2024)   No facility-administered encounter medications on file as of 02/14/2024.    Allergies (verified) Patient has no known allergies.   History: Past Medical History:  Diagnosis Date   Anxiety    Aortic atherosclerosis (HCC) 06/13/2018   July 2019 CT scan   Blind    Chronic LBP 09/13/2015   Chronic left hip pain 09/27/2015   Diabetes mellitus without complication (HCC)    Fracture, vertebra, pathologic    Gastroesophageal reflux disease without  esophagitis 02/06/2024   Gout    Hyperlipidemia    Hypertension    Hypomagnesemia    Retinitis pigmentosa    Sleep apnea    Past Surgical History:  Procedure Laterality Date   BACK SURGERY  2001   laminectomy lumbar spine   CATARACT EXTRACTION  1992   Insert Prosthetic Lens   COLONOSCOPY WITH PROPOFOL N/A 02/28/2017   Procedure: COLONOSCOPY WITH PROPOFOL;  Surgeon: Cassie Click, MD;  Location: [facility] ENDOSCOPY;  Service: Endoscopy;  Laterality: N/A;   COLONOSCOPY WITH PROPOFOL N/A 06/03/2020   Procedure: COLONOSCOPY WITH PROPOFOL;  Surgeon: Shane Darling, MD;  Location: [facility] ENDOSCOPY;  Service: Endoscopy;  Laterality: N/A;   COLONOSCOPY WITH PROPOFOL N/A 10/16/2023   Procedure: COLONOSCOPY WITH PROPOFOL;  Surgeon: Toledo, Alphonsus Jeans, MD;  Location: [facility] ENDOSCOPY;  Service: Gastroenterology;  Laterality: N/A;   ESOPHAGOGASTRODUODENOSCOPY (EGD) WITH PROPOFOL N/A 10/16/2023   Procedure: ESOPHAGOGASTRODUODENOSCOPY (EGD) WITH PROPOFOL;  Surgeon: Toledo, Alphonsus Jeans, MD;  Location: [facility] ENDOSCOPY;  Service: Gastroenterology;  Laterality: N/A;   EYE SURGERY     KYPHOPLASTY N/A 08/08/2018   Procedure: [id number];  Surgeon: Molli Angelucci, MD;  Location: [facility] ORS;  Service: Orthopedics;  Laterality: N/A;   LAMINECTOMY  2001   lumbar   MULTIPLE TOOTH EXTRACTIONS     2018   TIBIA FRACTURE SURGERY     TONSILLECTOMY     WRIST SURGERY Right    x2   Family History  Problem Relation Age of Onset   Colon polyps Mother    Retinitis pigmentosa Mother    Retinitis pigmentosa Maternal Grandmother    Cancer Maternal Grandfather    Diabetes Paternal Grandmother    Alcohol abuse Father    Social History   Socioeconomic History   Marital status: Divorced    Spouse name: Not on file   Number of children: 2   Years of education: Not on file   Highest education level: Not on file  Occupational History   Not on file  Tobacco Use   Smoking status: Some Days    Current packs/day: 0.75     Average packs/day: 0.8 packs/day for 35.0 years (26.3 ttl pk-yrs)    Types: Cigarettes   Smokeless tobacco: Never   Tobacco comments:    Quit smoking for 9 years, started again Feb 2022    Started at age 32 and smokes for a total of 30 plus years   Vaping Use   Vaping status: Never Used  Substance and Sexual Activity   Alcohol use: Yes    Comment: 15 beers per day, quit 05/2023   Drug use: Not Currently    Types: Marijuana, "Crack" cocaine, Cocaine, Other-see comments    Comment: Cocaine use previously; marijuana currently occasionally quit 11/03/2017   Sexual activity: Not Currently  Other Topics Concern   Not on file  Social History Narrative   Not on file   Social Drivers of Health   Financial Resource Strain: Low Risk  (02/14/2024)   Overall  Financial Resource Strain (CARDIA)    Difficulty of Paying Living Expenses: Not hard at all  Food Insecurity: No Food Insecurity (02/14/2024)   Hunger Vital Sign    Worried About Running Out of Food in the Last Year: Never true    Ran Out of Food in the Last Year: Never true  Transportation Needs: No Transportation Needs (02/14/2024)   PRAPARE - Administrator, Civil Service (Medical): No    Lack of Transportation (Non-Medical): No  Physical Activity: Inactive (02/14/2024)   Exercise Vital Sign    Days of Exercise per Week: 0 days    Minutes of Exercise per Session: 0 min  Stress: No Stress Concern Present (02/14/2024)   [employer] of Occupational Health - Occupational Stress Questionnaire    Feeling of Stress : Not at all  Social Connections: Socially Isolated (02/14/2024)   Social Connection and Isolation Panel [NHANES]    Frequency of Communication with Friends and Family: More than three times a week    Frequency of Social Gatherings with Friends and Family: More than three times a week    Attends Religious Services: Never    Database administrator or Organizations: No    Attends Banker Meetings:  Never    Marital Status: Divorced    Tobacco Counseling Ready to quit: No Counseling given: Yes Tobacco comments: Quit smoking for 9 years, started again Feb 2022 Started at age 73 and smokes for a total of 30 plus years     Clinical Intake:  Pre-visit preparation completed: Yes  Pain : No/denies pain     BMI - recorded: 31.02 Nutritional Status: BMI > 30  Obese Nutritional Risks: None Diabetes: No  Lab Results  Component Value Date   HGBA1C 6.5 (A) 02/06/2024   HGBA1C 6.4 (A) 08/23/2023   HGBA1C 5.7 (A) 05/16/2023     How often do you need to have someone help you when you read instructions, pamphlets, or other written materials from your doctor or pharmacy?: 1 - Never  Interpreter Needed?: No  Information entered by :: Sally Crazier CMA   Activities of Daily Living     02/14/2024   10:30 AM 08/23/2023   10:18 AM  In your present state of health, do you have any difficulty performing the following activities:  Hearing? 0 0  Vision? 1 1  Comment pt states he is almost blind. If he is outside he has to have help. Has no issues seeing inside his home. Declines a referral to establish with a new ophthalmologist.   Difficulty concentrating or making decisions? 0 1  Walking or climbing stairs? 0 1  Dressing or bathing? 0 1  Doing errands, shopping? 0 1  Preparing Food and eating ? N   Using the Toilet? N   In the past six months, have you accidently leaked urine? N   Do you have problems with loss of bowel control? N   Managing your Medications? N   Managing your Finances? N   Housekeeping or managing your Housekeeping? N     Patient Care Team: Sowles, Krichna, MD as PCP - General (Family Medicine)  Indicate any recent Medical Services you may have received from other than [facility] providers in the past year (date may be approximate).     Assessment:   This is a routine wellness examination for Oryon.  Hearing/Vision screen Hearing Screening - Comments:: Patient  denies any hearing difficulties.   Vision Screening - Comments:: Patient states  he isn't gonna go back to the eye doctor because he is almost blind and there is nothing they can do for him.    Goals Addressed             This Visit's Progress    Patient Stated       To remain sober.        Depression Screen     02/14/2024   10:51 AM 02/06/2024    9:23 AM 08/23/2023   10:18 AM 07/03/2023    2:45 PM 05/16/2023   10:38 AM 03/09/2023    1:35 PM 11/21/2022    2:03 PM  PHQ 2/9 Scores  PHQ - 2 Score 0 0 0 0 0 0 0  PHQ- 9 Score 0 0 0 0 0 0 0    Fall Risk     02/14/2024   10:46 AM 08/23/2023   10:18 AM 07/03/2023    2:44 PM 05/16/2023   10:38 AM 03/09/2023    1:34 PM  Fall Risk   Falls in the past year? 1 1 1  0 0  Number falls in past yr: 0 0 0 0 0  Injury with Fall? 1 0 0 0 0  Risk for fall due to : History of fall(s);Impaired balance/gait;Orthopedic patient;Impaired vision Impaired balance/gait;Impaired vision Impaired balance/gait Impaired vision No Fall Risks  Follow up Falls prevention discussed;Falls evaluation completed;Education provided Falls prevention discussed Falls prevention discussed;Education provided;Falls evaluation completed Falls prevention discussed Falls prevention discussed;Education provided;Falls evaluation completed    MEDICARE RISK AT HOME:  Medicare Risk at Home If so, are there any without handrails?: No Home free of loose throw rugs in walkways, pet beds, electrical cords, etc?: Yes Adequate lighting in your home to reduce risk of falls?: Yes Life alert?: No Use of a cane, walker or w/c?: No Grab bars in the bathroom?: Yes Shower chair or bench in shower?: Yes Elevated toilet seat or a handicapped toilet?: Yes  TIMED UP AND GO:  Was the test performed?  No  Cognitive Function: 6CIT completed        02/14/2024   10:51 AM 11/21/2022    2:27 PM  6CIT Screen  What Year? 0 points 0 points  What month? 0 points 0 points  What time? 0 points 0  points  Count back from 20 0 points 2 points  Months in reverse 0 points 0 points  Repeat phrase 0 points 0 points  Total Score 0 points 2 points    Immunizations Immunization History  Administered Date(s) Administered   Influenza Split 08/01/2011   Influenza, Seasonal, Injecte, Preservative Fre 07/11/2012, 08/23/2023   Influenza,inj,Quad PF,6+ Mos 08/13/2013, 09/13/2015, 09/07/2016, 09/19/2018, 08/01/2021, 08/01/2022   Influenza-Unspecified 08/10/2017, 08/03/2020   Moderna Sars-Covid-2 Vaccination 02/18/2020, 03/17/2020, 09/14/2020, 02/08/2021   PNEUMOCOCCAL CONJUGATE-20 02/06/2024   Pneumococcal Polysaccharide-23 09/15/2013   Tdap 09/11/2016   Zoster Recombinant(Shingrix) 07/08/2019, 12/05/2019    Screening Tests Health Maintenance  Topic Date Due   Lung Cancer Screening  Never done   OPHTHALMOLOGY EXAM  04/29/2018   COVID-19 Vaccine (5 - 2024-25 season) 02/22/2024 (Originally 07/01/2023)   INFLUENZA VACCINE  05/30/2024   HEMOGLOBIN A1C  08/07/2024   FOOT EXAM  08/22/2024   Colonoscopy  10/15/2024   Diabetic kidney evaluation - eGFR measurement  02/05/2025   Diabetic kidney evaluation - Urine ACR  02/05/2025   Medicare Annual Wellness (AWV)  02/13/2025   DTaP/Tdap/Td (2 - Td or Tdap) 09/11/2026   Pneumococcal Vaccine 27-41 Years old  Completed  Hepatitis C Screening  Completed   HIV Screening  Completed   Zoster Vaccines- Shingrix  Completed   HPV VACCINES  Aged Out   Meningococcal B Vaccine  Aged Out    Health Maintenance  Health Maintenance Due  Topic Date Due   Lung Cancer Screening  Never done   OPHTHALMOLOGY EXAM  04/29/2018   Health Maintenance Items Addressed: Patient declines referral to ophthalmologist Patient is aware that a lung cancer screening is recommended. States he has discussed with his primary care provider and when he is ready to have this done, he will let her know.   Additional Screening:  Vision Screening: Recommended annual  ophthalmology exams for early detection of glaucoma and other disorders of the eye.  Dental Screening: Recommended annual dental exams for proper oral hygiene  Community Resource Referral / Chronic Care Management: CRR required this visit?  No   CCM required this visit?  No     Plan:     I have personally reviewed and noted the following in the patient's chart:   Medical and social history Use of alcohol, tobacco or illicit drugs  Current medications and supplements including opioid prescriptions. Patient is not currently taking opioid prescriptions. Functional ability and status Nutritional status Physical activity Advanced directives List of other physicians Hospitalizations, surgeries, and ER visits in previous 12 months Vitals Screenings to include cognitive, depression, and falls Referrals and appointments  In addition, I have reviewed and discussed with patient certain preventive protocols, quality metrics, and best practice recommendations. A written personalized care plan for preventive services as well as general preventive health recommendations were provided to patient.      Izaiah Tabb, CMA   02/14/2024   After Visit Summary: (MyChart) Due to this being a telephonic visit, the after visit summary with patients personalized plan was offered to patient via MyChart   Notes: Nothing significant to report at this time.

## 2024-02-14 NOTE — Patient Instructions (Signed)
 Joel Bryant , Thank you for taking time to come for your Medicare Wellness Visit. I appreciate your ongoing commitment to your health goals. Please review the following plan we discussed and let me know if I can assist you in the future.   Referrals/Orders/Follow-Ups/Clinician Recommendations:  Next Medicare AWV: February 16, 2025 at 10:40 am TELEPHONE VISIT.   When you are ready to have your lung cancer screening done, please reach out to your pcp so she can set that up.      This is a list of the screening recommended for you and due dates:  Health Maintenance  Topic Date Due   Screening for Lung Cancer  Never done   Eye exam for diabetics  04/29/2018   COVID-19 Vaccine (5 - 2024-25 season) 02/22/2024*   Flu Shot  05/30/2024   Hemoglobin A1C  08/07/2024   Complete foot exam   08/22/2024   Colon Cancer Screening  10/15/2024   Yearly kidney function blood test for diabetes  02/05/2025   Yearly kidney health urinalysis for diabetes  02/05/2025   Medicare Annual Wellness Visit  02/13/2025   DTaP/Tdap/Td vaccine (2 - Td or Tdap) 09/11/2026   Pneumococcal Vaccination  Completed   Hepatitis C Screening  Completed   HIV Screening  Completed   Zoster (Shingles) Vaccine  Completed   HPV Vaccine  Aged Out   Meningitis B Vaccine  Aged Out  *Topic was postponed. The date shown is not the original due date.    Advanced directives: (Declined) Advance directive discussed with you today. Even though you declined this today, please call our office should you change your mind, and we can give you the proper paperwork for you to fill out. Advance Care Planning is important because it:  [x]  Makes sure you receive the medical care that is consistent with your values, goals, and preferences  [x]  It provides guidance to your family and loved ones and it also reduces their decisional burden about whether or not they are making the right decisions based on what you want done  Follow the link provided in  your after visit summary or read over the paperwork we have mailed to you to help you started getting your Advance Directives in place. If you need assistance in completing these, please reach out to [place] so that we can help you!   Next Medicare Annual Wellness Visit scheduled for next year: yes  Understanding Your Risk for Falls Millions of people have serious injuries from falls each year. It is important to understand your risk of falling. Talk with your health care provider about your risk and what you can do to lower it. If you do have a serious fall, make sure to tell your provider. Falling once raises your risk of falling again. How can falls affect me? Serious injuries from falls are common. These include: Broken bones, such as hip fractures. Head injuries, such as traumatic brain injuries (TBI) or concussions. A fear of falling can cause you to avoid activities and stay at home. This can make your muscles weaker and raise your risk for a fall. What can increase my risk? There are a number of risk factors that increase your risk for falling. The more risk factors you have, the higher your risk of falling. Serious injuries from a fall happen most often to people who are older than 64 years old. Teenagers and young adults ages 25-29 are also at higher risk. Common risk factors include: Weakness in the lower  body. Being generally weak or confused due to long-term (chronic) illness. Dizziness or balance problems. Poor vision. Medicines that cause dizziness or drowsiness. These may include: Medicines for your blood pressure, heart, anxiety, insomnia, or swelling (edema). Pain medicines. Muscle relaxants. Other risk factors include: Drinking alcohol. Having had a fall in the past. Having foot pain or wearing improper footwear. Working at a dangerous job. Having any of the following in your home: Tripping hazards, such as floor clutter or loose rugs. Poor lighting. Pets. Having  dementia or memory loss. What actions can I take to lower my risk of falling?     Physical activity Stay physically fit. Do strength and balance exercises. Consider taking a regular class to build strength and balance. Yoga and tai chi are good options. Vision Have your eyes checked every year and your prescription for glasses or contacts updated as needed. Shoes and walking aids Wear non-skid shoes. Wear shoes that have rubber soles and low heels. Do not wear high heels. Do not walk around the house in socks or slippers. Use a cane or walker as told by your provider. Home safety Attach secure railings on both sides of your stairs. Install grab bars for your bathtub, shower, and toilet. Use a non-skid mat in your bathtub or shower. Attach bath mats securely with double-sided, non-slip rug tape. Use good lighting in all rooms. Keep a flashlight near your bed. Make sure there is a clear path from your bed to the bathroom. Use night-lights. Do not use throw rugs. Make sure all carpeting is taped or tacked down securely. Remove all clutter from walkways and stairways, including extension cords. Repair uneven or broken steps and floors. Avoid walking on icy or slippery surfaces. Walk on the grass instead of on icy or slick sidewalks. Use ice melter to get rid of ice on walkways in the winter. Use a cordless phone. Questions to ask your health care provider Can you help me check my risk for a fall? Do any of my medicines make me more likely to fall? Should I take a vitamin D supplement? What exercises can I do to improve my strength and balance? Should I make an appointment to have my vision checked? Do I need a bone density test to check for weak bones (osteoporosis)? Would it help to use a cane or a walker? Where to find more information Centers for Disease Control and Prevention, STEADI: TonerPromos.no Community-Based Fall Prevention Programs: TonerPromos.no [employer] on Aging:  BaseRingTones.pl Contact a health care provider if: You fall at home. You are afraid of falling at home. You feel weak, drowsy, or dizzy. This information is not intended to replace advice given to you by your health care provider. Make sure you discuss any questions you have with your health care provider. Document Revised: 06/19/2022 Document Reviewed: 06/19/2022 Elsevier Patient Education  2024 [employer].

## 2024-02-26 ENCOUNTER — Ambulatory Visit (INDEPENDENT_AMBULATORY_CARE_PROVIDER_SITE_OTHER): Admitting: Professional Counselor

## 2024-02-26 DIAGNOSIS — F1021 Alcohol dependence, in remission: Secondary | ICD-10-CM
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
## 2024-03-19 ENCOUNTER — Telehealth: Payer: Self-pay | Admitting: Family Medicine

## 2024-03-19 NOTE — Telephone Encounter (Signed)
 Copied from CRM [id number]. Topic: Referral - Question >> Mar 19, 2024  3:48 PM Adaysia C wrote: Reason for CRM: Patient has requested a follow up call to discuss an orthopedic referral; please follow up with patient [phone]

## 2024-03-19 NOTE — Telephone Encounter (Signed)
 Appt made for Friday with Leisa PA

## 2024-03-21 ENCOUNTER — Ambulatory Visit: Admitting: Family Medicine

## 2024-03-28 ENCOUNTER — Other Ambulatory Visit: Payer: Self-pay | Admitting: Internal Medicine

## 2024-03-28 DIAGNOSIS — K7031 Alcoholic cirrhosis of liver with ascites: Secondary | ICD-10-CM

## 2024-04-02 ENCOUNTER — Ambulatory Visit
Admission: RE | Admit: 2024-04-02 | Discharge: 2024-04-02 | Discharge status: Home / Self Care | Source: Ambulatory Visit | Attending: Internal Medicine | Admitting: Internal Medicine

## 2024-04-02 DIAGNOSIS — K7031 Alcoholic cirrhosis of liver with ascites: Secondary | ICD-10-CM

## 2024-04-14 ENCOUNTER — Ambulatory Visit: Admitting: Professional Counselor

## 2024-04-16 ENCOUNTER — Ambulatory Visit: Admitting: Professional Counselor

## 2024-04-28 ENCOUNTER — Ambulatory Visit: Admitting: Professional Counselor

## 2024-07-03 DIAGNOSIS — H543 Unqualified visual loss, both eyes: Secondary | ICD-10-CM | POA: Insufficient documentation

## 2024-07-23 ENCOUNTER — Ambulatory Visit (INDEPENDENT_AMBULATORY_CARE_PROVIDER_SITE_OTHER): Admitting: Professional Counselor

## 2024-07-23 DIAGNOSIS — F4320 Adjustment disorder, unspecified: Secondary | ICD-10-CM | POA: Diagnosis not present

## 2024-07-23 DIAGNOSIS — F1021 Alcohol dependence, in remission: Secondary | ICD-10-CM | POA: Diagnosis not present

## 2024-07-23 NOTE — Progress Notes (Signed)
  THERAPIST PROGRESS NOTE  Session Time: 10:00 AM - 10:55 AM  Participation Level: Active  Behavioral Response: Well Groomed, Alert, Euthymic  Type of Therapy: Individual Therapy  Treatment Goals addressed: Active OP Depression  LTG: Trying to get on with my life and just see what happens. There ain't going to be a lot I can do to change it, so I need to accept it and go along with the flow.     Start:  07/23/24    Expected End:  07/22/25     STG: To process health and life transitions AEB self-report acceptance and maintaining wellness over the next 90 days.    ProgressTowards Goals: Initial  Interventions: Motivational Interviewing and Supportive  Summary: Joel Bryant is a 63 y.o. male who presents with adjustment disorder and alcohol use disorder in remission. He appeared alert and oriented x5. He shared updates since his initial assessment in April. Joel Bryant noted he thought he wasn't going to have to come back but his doctor stated he will need a liver in a couple of years. He scored minimally on anxiety and depression screenings today. He was unsure what to focus treatment goals on, but was in agreement to work towards acceptance of his health conditions and maintaining wellness. Joel Bryant engaged in discussion sharing stories from his past. He also shared a new hobby he has, breeding rats.   Therapist Response: Conducted session with Joel Bryant. Began session with check-in/update since previous session. Utilized empathetic and reflective listening. Developed treatment plan with input from [place] on current strengths and needs. Used open-ended questions to facilitate discussion and summarized Joel Bryant's thoughts/feelings. Scheduled additional appointment and concluded session.   Suicidal/Homicidal: No  Plan: Return again in 3 weeks.  Diagnosis: Alcohol use disorder, severe, in early remission (HCC)  Adjustment disorder, unspecified type  Collaboration of Care: N/A  Patient/Guardian was  advised Release of Information must be obtained prior to any record release in order to collaborate their care with an outside provider. Patient/Guardian was advised if they have not already done so to contact the registration department to sign all necessary forms in order for us  to release information regarding their care.   Consent: Patient/Guardian gives verbal consent for treatment and assignment of benefits for services provided during this visit. Patient/Guardian expressed understanding and agreed to proceed.   Joel Bryant, [facility] 07/23/2024

## 2024-08-04 ENCOUNTER — Other Ambulatory Visit: Payer: Self-pay | Admitting: Family Medicine

## 2024-08-04 DIAGNOSIS — I10 Essential (primary) hypertension: Secondary | ICD-10-CM

## 2024-08-04 DIAGNOSIS — F1021 Alcohol dependence, in remission: Secondary | ICD-10-CM

## 2024-08-06 ENCOUNTER — Encounter: Payer: Self-pay | Admitting: Family Medicine

## 2024-08-06 ENCOUNTER — Ambulatory Visit: Admitting: Family Medicine

## 2024-08-06 VITALS — BP 130/70 | HR 62 | Resp 18 | Ht 68.0 in | Wt 221.7 lb

## 2024-08-06 DIAGNOSIS — F1911 Other psychoactive substance abuse, in remission: Secondary | ICD-10-CM

## 2024-08-06 DIAGNOSIS — K766 Portal hypertension: Secondary | ICD-10-CM

## 2024-08-06 DIAGNOSIS — Z23 Encounter for immunization: Secondary | ICD-10-CM

## 2024-08-06 DIAGNOSIS — J410 Simple chronic bronchitis: Secondary | ICD-10-CM

## 2024-08-06 DIAGNOSIS — E1140 Type 2 diabetes mellitus with diabetic neuropathy, unspecified: Secondary | ICD-10-CM

## 2024-08-06 DIAGNOSIS — K7031 Alcoholic cirrhosis of liver with ascites: Secondary | ICD-10-CM

## 2024-08-06 DIAGNOSIS — K3189 Other diseases of stomach and duodenum: Secondary | ICD-10-CM

## 2024-08-06 DIAGNOSIS — I10 Essential (primary) hypertension: Secondary | ICD-10-CM

## 2024-08-06 DIAGNOSIS — K219 Gastro-esophageal reflux disease without esophagitis: Secondary | ICD-10-CM

## 2024-08-06 DIAGNOSIS — L729 Follicular cyst of the skin and subcutaneous tissue, unspecified: Secondary | ICD-10-CM

## 2024-08-06 DIAGNOSIS — B070 Plantar wart: Secondary | ICD-10-CM

## 2024-08-06 DIAGNOSIS — I700 Atherosclerosis of aorta: Secondary | ICD-10-CM

## 2024-08-06 DIAGNOSIS — I8500 Esophageal varices without bleeding: Secondary | ICD-10-CM | POA: Diagnosis not present

## 2024-08-06 DIAGNOSIS — F1021 Alcohol dependence, in remission: Secondary | ICD-10-CM

## 2024-08-06 LAB — POCT GLYCOSYLATED HEMOGLOBIN (HGB A1C): Hemoglobin A1C: 6.4 % — AB (ref 4.0–5.6)

## 2024-08-06 MED ORDER — LISINOPRIL 2.5 MG PO TABS: 2.5000 mg | ORAL_TABLET | Freq: Every day | ORAL | 1 refills | Status: AC

## 2024-08-06 MED ORDER — ESOMEPRAZOLE MAGNESIUM 40 MG PO CPDR
40.0000 mg | DELAYED_RELEASE_CAPSULE | Freq: Every day | ORAL | 1 refills | Status: AC
Start: 2024-08-06 — End: ?

## 2024-08-06 MED ORDER — OMEGA-3-ACID ETHYL ESTERS 1 G PO CAPS: 2.0000 g | ORAL_CAPSULE | Freq: Two times a day (BID) | ORAL | 1 refills | Status: AC

## 2024-08-06 MED ORDER — FUROSEMIDE 40 MG PO TABS: 40.0000 mg | ORAL_TABLET | Freq: Every day | ORAL | 1 refills | Status: AC

## 2024-08-06 MED ORDER — ATORVASTATIN CALCIUM 20 MG PO TABS: ORAL_TABLET | ORAL | 1 refills | Status: AC

## 2024-08-06 MED ORDER — ATENOLOL 50 MG PO TABS: 50.0000 mg | ORAL_TABLET | Freq: Every day | ORAL | 1 refills | Status: AC

## 2024-08-06 NOTE — Progress Notes (Signed)
 Name: Joel Bryant   MRN: [id number]    DOB: Mar 07, 1961   Date:08/06/2024       Progress Note  Subjective  Chief Complaint  Chief Complaint  Patient presents with   Medical Management of Chronic Issues   Hyperlipidemia   Hypertension   Diabetes   Foot Injury    Sore on bottom of left foot.  Wants podiatry referral   Discussed the use of AI scribe software for clinical note transcription with the patient, who gave verbal consent to proceed.  History of Present Illness Joel Bryant is a 63 year old male with diabetic neuropathy and alcoholic cirrhosis who presents for follow-up and evaluation of foot issues.  He has a knot on the lateral part of his left heel, which bothers him when he puts shoes on. The knot  has been present for a long time.  He has a tender spot behind his little toe on the right foot, which he attributes to an incident 15 years ago when he 'pegged it down too deep' while intoxicated.  He experiences intermittent sharp pains in his feet due to diabetic neuropathy. His diabetes is currently diet-controlled with an A1c of 6.4%  He has a history of chronic alcoholism, having abstained from alcohol since August 20 of the previous year following a diagnosis of cirrhosis of the liver. He is currently seeing a therapist named Almarie and has been referred to hepatologist to see if he is a candidate for liver transplant, although he has not yet been contacted for an appointment.  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 He also has a history of drug abuse, which has been in remission for six years.  His current medications include atenolol  50 mg, lisinopril  2.5 mg, atorvastatin , omega-3 fatty acids, and esomeprazole . He was previously on spironolactone  and furosemide  but is unsure about the current status of these prescriptions.    Patient Active Problem List   Diagnosis Date Noted   Blind in both eyes 07/03/2024    Esophageal varices determined by endoscopy (HCC) 02/06/2024   Portal hypertensive gastropathy (HCC) 02/06/2024   Smokers' cough (HCC) 02/06/2024   Gastroesophageal reflux disease without esophagitis 02/06/2024   B12 deficiency 02/06/2024   Overweight (BMI 25.0-29.9) 06/21/2023   Alcoholic cirrhosis of liver with ascites (HCC) 06/20/2023   Hypokalemia 06/20/2023   Anemia 06/20/2023   Hyponatremia 06/20/2023   Cerebral atrophy 05/16/2023   Thrombocytopenia 05/16/2023   Chronic alcoholism in remission (HCC) 05/16/2023   Periumbilical hernia 05/16/2023   GAD (generalized anxiety disorder) 11/20/2020   History of drug abuse in remission (HCC) 04/30/2020   Closed compression fracture of thoracic vertebra (HCC) 06/13/2018   Fatty liver 06/13/2018   Aortic atherosclerosis 06/13/2018   Chronic pain syndrome 09/06/2016   Dyslipidemia associated with type 2 diabetes mellitus (HCC) 09/27/2015   H/O adenomatous polyp of colon 09/27/2015   Dysmetabolic syndrome 09/27/2015   Post Laminectomy Syndrome (L4-5 Hemilaminectomy and partial Facetectomy) 09/27/2015   Lumbar spondylosis 09/27/2015   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   Epidural fibrosis 09/27/2015   Chronic low back pain (Location of Primary Source of Pain) (Bilateral) (L>R) 09/27/2015   Hypomagnesemia 09/27/2015   Chronic left knee pain 09/27/2015   Lumbar spinal stenosis (L4-5) 09/27/2015   Lumbar foraminal stenosis (Bilateral L3-4 and L4-5) 09/27/2015   Lumbar facet hypertrophy (Bilateral, Multilevel) 09/27/2015   Lumbar facet syndrome (Bilateral) 09/27/2015   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)  09/27/2015   Chronic hip pain (Location of Secondary source of pain) (Bilateral) (L>R) (secondary to osteonecrosis) 09/27/2015   Edema leg 09/13/2015   Gout 09/13/2015   Obstructive apnea 09/13/2015   Anxiety disorder 06/11/2015   Hypertriglyceridemia 06/11/2015   Essential hypertension 06/11/2015    Dyslipidemia 06/11/2015   Controlled type 2 diabetes mellitus with diabetic neuropathy, without long-term current use of insulin  (HCC) 06/11/2015   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   Retinitis pigmentosa 06/11/2015    Past Surgical History:  Procedure Laterality Date   BACK SURGERY  2001   laminectomy lumbar spine   CATARACT EXTRACTION  1992   Insert Prosthetic Lens   COLONOSCOPY WITH PROPOFOL  N/A 02/28/2017   Procedure: COLONOSCOPY WITH PROPOFOL ;  Surgeon: Lamar ONEIDA Holmes, MD;  Location: [facility] ENDOSCOPY;  Service: Endoscopy;  Laterality: N/A;   COLONOSCOPY WITH PROPOFOL  N/A 06/03/2020   Procedure: COLONOSCOPY WITH PROPOFOL ;  Surgeon: Maryruth Ole ONEIDA, MD;  Location: [facility] ENDOSCOPY;  Service: Endoscopy;  Laterality: N/A;   COLONOSCOPY WITH PROPOFOL  N/A 10/16/2023   Procedure: COLONOSCOPY WITH PROPOFOL ;  Surgeon: Toledo, Ladell POUR, MD;  Location: [facility] ENDOSCOPY;  Service: Gastroenterology;  Laterality: N/A;   ESOPHAGOGASTRODUODENOSCOPY (EGD) WITH PROPOFOL  N/A 10/16/2023   Procedure: ESOPHAGOGASTRODUODENOSCOPY (EGD) WITH PROPOFOL ;  Surgeon: Toledo, Ladell POUR, MD;  Location: [facility] ENDOSCOPY;  Service: Gastroenterology;  Laterality: N/A;   EYE SURGERY     KYPHOPLASTY N/A 08/08/2018   Procedure: [id number];  Surgeon: Kathlynn Sharper, MD;  Location: [facility] ORS;  Service: Orthopedics;  Laterality: N/A;   LAMINECTOMY  2001   lumbar   MULTIPLE TOOTH EXTRACTIONS     2018   TIBIA FRACTURE SURGERY     TONSILLECTOMY     WRIST SURGERY Right    x2    Family History  Problem Relation Age of Onset   Colon polyps Mother    Retinitis pigmentosa Mother    Retinitis pigmentosa Maternal Grandmother    Cancer Maternal Grandfather    Diabetes Paternal Grandmother    Alcohol abuse Father     Social History   Tobacco Use   Smoking status: Some Days    Current packs/day: 0.75    Average packs/day: 0.8 packs/day for 35.0 years (26.3 ttl pk-yrs)    Types: Cigarettes   Smokeless tobacco:  Never   Tobacco comments:    Quit smoking for 9 years, started again Feb 2022    Started at age 24 and smokes for a total of 30 plus years   Substance Use Topics   Alcohol use: Yes    Comment: 15 beers per day, quit 05/2023     Current Outpatient Medications:    Cyanocobalamin  (B-12) 1000 MCG SUBL, Place 1 tablet under the tongue daily., Disp: 100 tablet, Rfl: 1   atenolol  (TENORMIN ) 50 MG tablet, Take 1 tablet (50 mg total) by mouth daily., Disp: 90 tablet, Rfl: 1   atorvastatin  (LIPITOR) 20 MG tablet, TAKE ONE TABLET BY MOUTH ONCE A DAY AT SIX IN THE EVENING, Disp: 90 tablet, Rfl: 1   colchicine  0.6 MG tablet, TAKE 1 TABLET BY MOUTH TWICE DAILY AS NEEDED. TAKE ONE AND MAY REPEAT A SECONDDOSE IN 2 HOURS, MAY STOP WHEN PAIN RESOLVES (Patient not taking: Reported on 08/06/2024), Disp: 30 tablet, Rfl: 1   esomeprazole  (NEXIUM ) 40 MG capsule, Take 1 capsule (40 mg total) by mouth daily., Disp: 90 capsule, Rfl: 1   furosemide  (LASIX ) 40 MG tablet, Take 1 tablet (40 mg total) by mouth daily., Disp: 90 tablet, Rfl:  1   lisinopril  (ZESTRIL ) 2.5 MG tablet, Take 1 tablet (2.5 mg total) by mouth daily., Disp: 90 tablet, Rfl: 1   omega-3 acid ethyl esters (LOVAZA ) 1 g capsule, Take 2 capsules (2 g total) by mouth 2 (two) times daily., Disp: 360 capsule, Rfl: 1   spironolactone  (ALDACTONE ) 100 MG tablet, Take 100 mg by mouth daily. (Patient not taking: Reported on 08/06/2024), Disp: , Rfl:   No Known Allergies  I personally reviewed active problem list, medication list, allergies with the patient/caregiver today.   ROS  Ten systems reviewed and is negative except as mentioned in HPI    Objective Physical Exam  CONSTITUTIONAL: Patient appears well-developed and well-nourished. No distress. HEENT: Head atraumatic, normocephalic, neck supple. CARDIOVASCULAR: Normal rate, regular rhythm and normal heart sounds. No murmur heard. No edema in lower extremities. PULMONARY: Effort normal and breath  sounds normal. No respiratory distress. ABDOMINAL: There is positive fluid rebound from ascites during percussion of abdomen  MUSCULOSKELETAL: slow gait .  PSYCHIATRIC: Patient has a normal mood and affect. Behavior is normal. Judgment and thought content normal. NEUROLOGICAL: Sensation intact in feet.  Vitals:   08/06/24 1031  BP: 130/70  Pulse: 62  Resp: 18  SpO2: 99%  Weight: 221 lb 11.2 oz (100.6 kg)  Height: 5' 8 (1.727 m)    Body mass index is 33.71 kg/m.  Recent Results (from the past 2160 hours)  POCT glycosylated hemoglobin (Hb A1C)     Status: Abnormal   Collection Time: 08/06/24 10:48 AM  Result Value Ref Range   Hemoglobin A1C 6.4 (A) 4.0 - 5.6 %   HbA1c POC (<> result, manual entry)     HbA1c, POC (prediabetic range)     HbA1c, POC (controlled diabetic range)      Diabetic Foot Exam:  Diabetic foot exam was performed with the following findings:   Normal sensation of 10g monofilament Intact posterior tibialis and dorsalis pedis pulses Cyst of left lateral heel, also plantar wart present on distal lateral left foot      PHQ2/9:    07/23/2024   10:25 AM 02/26/2024   10:13 AM 02/14/2024   10:51 AM 02/06/2024    9:23 AM 08/23/2023   10:18 AM  Depression screen PHQ 2/9  Decreased Interest   0 0 0  Down, Depressed, Hopeless   0 0 0  PHQ - 2 Score   0 0 0  Altered sleeping   0 0 0  Tired, decreased energy   0 0 0  Change in appetite   0 0 0  Feeling bad or failure about yourself    0 0 0  Trouble concentrating   0 0 0  Moving slowly or fidgety/restless   0 0 0  Suicidal thoughts   0 0 0  PHQ-9 Score   0 0 0  Difficult doing work/chores   Not difficult at all Not difficult at all      Information is confidential and restricted. Go to Review Flowsheets to unlock data.    phq 9 is seeing therapist   Fall Risk:    02/14/2024   10:46 AM 08/23/2023   10:18 AM 07/03/2023    2:44 PM 05/16/2023   10:38 AM 03/09/2023    1:34 PM  Fall Risk   Falls in the  past year? 1 1 1  0 0  Number falls in past yr: 0 0 0 0 0  Injury with Fall? 1 0 0 0 0  Risk for fall  due to : History of fall(s);Impaired balance/gait;Orthopedic patient;Impaired vision Impaired balance/gait;Impaired vision Impaired balance/gait Impaired vision No Fall Risks  Follow up Falls prevention discussed;Falls evaluation completed;Education provided Falls prevention discussed Falls prevention discussed;Education provided;Falls evaluation completed Falls prevention discussed Falls prevention discussed;Education provided;Falls evaluation completed      Assessment & Plan Plantar wart and cyst, left foot Gel-filled cyst on lateral left heel and deep plantar wart behind left little toe causing discomfort. - Refer to podiatrist for evaluation and treatment.  Bunion, left foot Bunion on lateral left foot without significant symptoms.  Alcoholic cirrhosis of liver with ascites Managed with spironolactone . Awaiting liver specialist referral for transplant evaluation. Discussed hepatitis A and B vaccinations. - Continue spironolactone  100 mg as prescribed. - Follow up with liver specialist for transplant evaluation. - Discussed  hepatitis A and B vaccines, however we only have hepatitis B vaccines in our office, he will discuss it with GI or get it at local pharmacy  - Schedule follow-up for hepatitis vaccine series.  Gastroesophageal reflux disease and esophageal varices Managed with esomeprazole  to prevent bleeding and manage symptoms. - Continue esomeprazole  as prescribed.  Type 2 diabetes mellitus with diabetic neuropathy Diabetes well-controlled with A1c of 6.4%. Neuropathy symptoms present but sensation intact. - Continue current management with diet control.  Hypertension Managed with atenolol  and lisinopril . Blood pressure well-controlled at 130/70 mmHg. - Continue atenolol  50 mg daily. - Continue lisinopril  2.5 mg daily.  Hyperlipidemia Managed with atorvastatin  and  omega-3 fatty acids. Cholesterol levels within acceptable range. - Continue atorvastatin  as prescribed. - Continue omega-3 fatty acids twice daily.  Retinitis pigmentosa causing legal blindness Retinitis pigmentosa causing legal blindness.  Chronic alcoholism in remission In remission since August 20 of the previous year. Continues therapy sessions. - Continue attending therapy sessions.  Drug abuse in remission In remission for six years.  General Health Maintenance Discussed hepatitis A and B vaccinations due to liver condition. Flu shot administered. Smoking cessation and lung cancer screening discussed.

## 2024-08-07 ENCOUNTER — Ambulatory Visit: Admitting: Family Medicine

## 2024-08-13 ENCOUNTER — Ambulatory Visit (INDEPENDENT_AMBULATORY_CARE_PROVIDER_SITE_OTHER): Admitting: Professional Counselor

## 2024-08-13 DIAGNOSIS — F4320 Adjustment disorder, unspecified: Secondary | ICD-10-CM

## 2024-08-13 DIAGNOSIS — F1021 Alcohol dependence, in remission: Secondary | ICD-10-CM

## 2024-08-13 NOTE — Progress Notes (Unsigned)
  THERAPIST PROGRESS NOTE  Session Time: 10:00 AM - 10:53 AM   Participation Level: Active  Behavioral Response: Casual, Alert, Euthymic  Type of Therapy: Individual Therapy  Treatment Goals addressed:  Active OP Depression  LTG: Trying to get on with my life and just see what happens. There ain't going to be a lot I can do to change it, so I need to accept it and go along with the flow.                 Start:  07/23/24    Expected End:  07/22/25      STG: To process health and life transitions AEB self-report acceptance and maintaining wellness over the next 90 days.    ProgressTowards Goals: Progressing  Interventions: Motivational Interviewing and Supportive  Summary: Joel Bryant is a 63 y.o. male who presents with a history of alcohol use disorder and adjustment disorder. He appeared alert and oriented x5. He shared that he is feeling good and also noted changes in previous thinking, moving towards acceptance of his health and wanting to live as long as he can. Joel Bryant shared memories of his past and noted ways he has been lucky in his life. He continues to be hopeful that his health can improve.   Therapist Response: Conducted session with [name]. Began session with check-in/update since previous session. Utilized empathetic and reflective listening. Used open-ended questions to facilitate discussion and summarized Joel Bryant's thoughts/feelings. Scheduled additional appointment and concluded session.   Suicidal/Homicidal: No  Plan: Return again in 5 weeks.  Diagnosis: Adjustment disorder, unspecified type  Alcohol use disorder, severe, in early remission (HCC)  Collaboration of Care: N/A  Patient/Guardian was advised Release of Information must be obtained prior to any record release in order to collaborate their care with an outside provider. Patient/Guardian was advised if they have not already done so to contact the registration department to sign all necessary forms in  order for us  to release information regarding their care.   Consent: Patient/Guardian gives verbal consent for treatment and assignment of benefits for services provided during this visit. Patient/Guardian expressed understanding and agreed to proceed.   Joel Bryant, [facility] 08/14/2024

## 2024-09-02 ENCOUNTER — Ambulatory Visit (INDEPENDENT_AMBULATORY_CARE_PROVIDER_SITE_OTHER): Admitting: Podiatry

## 2024-09-02 DIAGNOSIS — M216X2 Other acquired deformities of left foot: Secondary | ICD-10-CM

## 2024-09-02 NOTE — Progress Notes (Signed)
 Subjective:  Patient ID: Joel Bryant, male    DOB: 1961/04/19,  MRN: [id number]  Chief Complaint  Patient presents with   Callouses    63 y.o. male presents with the above complaint.  Patient presents with left submetatarsal 5 porokeratotic lesion with central nucleated core noted pain on palpation to the lesion has not seen MRIs prior to seeing me denies any other acute complaints would like to discuss treatment options for this.  Pain scale 7 out of 10 dull aching nature   Review of Systems: Negative except as noted in the HPI. Denies N/V/F/Ch.  Past Medical History:  Diagnosis Date   Anxiety    Aortic atherosclerosis 06/13/2018   July 2019 CT scan   Blind    Chronic LBP 09/13/2015   Chronic left hip pain 09/27/2015   Diabetes mellitus without complication (HCC)    Fracture, vertebra, pathologic    Gastroesophageal reflux disease without esophagitis 02/06/2024   Gout    Hyperlipidemia    Hypertension    Hypomagnesemia    Retinitis pigmentosa    Sleep apnea     Current Outpatient Medications:    atenolol  (TENORMIN ) 50 MG tablet, Take 1 tablet (50 mg total) by mouth daily., Disp: 90 tablet, Rfl: 1   atorvastatin  (LIPITOR) 20 MG tablet, TAKE ONE TABLET BY MOUTH ONCE A DAY AT SIX IN THE EVENING, Disp: 90 tablet, Rfl: 1   colchicine  0.6 MG tablet, TAKE 1 TABLET BY MOUTH TWICE DAILY AS NEEDED. TAKE ONE AND MAY REPEAT A SECONDDOSE IN 2 HOURS, MAY STOP WHEN PAIN RESOLVES (Patient not taking: Reported on 08/06/2024), Disp: 30 tablet, Rfl: 1   Cyanocobalamin  (B-12) 1000 MCG SUBL, Place 1 tablet under the tongue daily., Disp: 100 tablet, Rfl: 1   esomeprazole  (NEXIUM ) 40 MG capsule, Take 1 capsule (40 mg total) by mouth daily., Disp: 90 capsule, Rfl: 1   furosemide  (LASIX ) 40 MG tablet, Take 1 tablet (40 mg total) by mouth daily., Disp: 90 tablet, Rfl: 1   lisinopril  (ZESTRIL ) 2.5 MG tablet, Take 1 tablet (2.5 mg total) by mouth daily., Disp: 90 tablet, Rfl: 1   omega-3 acid ethyl  esters (LOVAZA ) 1 g capsule, Take 2 capsules (2 g total) by mouth 2 (two) times daily., Disp: 360 capsule, Rfl: 1   spironolactone  (ALDACTONE ) 100 MG tablet, Take 100 mg by mouth daily. (Patient not taking: Reported on 08/06/2024), Disp: , Rfl:   Social History   Tobacco Use  Smoking Status Some Days   Current packs/day: 0.75   Average packs/day: 0.8 packs/day for 35.0 years (26.3 ttl pk-yrs)   Types: Cigarettes  Smokeless Tobacco Never  Tobacco Comments   Quit smoking for 9 years, started again Feb 2022   Started at age 68 and smokes for a total of 30 plus years     No Known Allergies Objective:  There were no vitals filed for this visit. There is no height or weight on file to calculate BMI. Constitutional Well developed. Well nourished.  Vascular Dorsalis pedis pulses palpable bilaterally. Posterior tibial pulses palpable bilaterally. Capillary refill normal to all digits.  No cyanosis or clubbing noted. Pedal hair growth normal.  Neurologic Normal speech. Oriented to person, place, and time. Epicritic sensation to light touch grossly present bilaterally.  Dermatologic Left submetatarsal 5 porokeratosis with central nucleated core pain on palpation to the lesion no open wounds noted.  Orthopedic: Normal joint ROM without pain or crepitus bilaterally. No visible deformities. No bony tenderness.   Radiographs: None  Assessment:   1. Plantar flexed metatarsal, left    Plan:  Patient was evaluated and treated and all questions answered.  Left submetatarsal 5 porokeratosis/benign skin lesion - All questions and concerns were discussed with the patient in extensive detail given the amount of pain that he is having he will benefit from debridement of the lesion using chisel blade and under the lesion was debride down to healthy discharge tissue no complication noted no pinpoint bleeding noted. - Shoe gear modification discussed  No follow-ups on file.

## 2024-09-17 ENCOUNTER — Ambulatory Visit (INDEPENDENT_AMBULATORY_CARE_PROVIDER_SITE_OTHER): Admitting: Professional Counselor

## 2024-09-17 DIAGNOSIS — F4320 Adjustment disorder, unspecified: Secondary | ICD-10-CM

## 2024-09-17 DIAGNOSIS — F1021 Alcohol dependence, in remission: Secondary | ICD-10-CM | POA: Diagnosis not present

## 2024-09-17 NOTE — Progress Notes (Signed)
  THERAPIST PROGRESS NOTE  Session Time: 10:05 AM - 10:55 AM   Participation Level: Active  Behavioral Response: Well Groomed, Alert, Euthymic  Type of Therapy: Individual Therapy  Treatment Goals addressed: Active OP Depression  LTG: Trying to get on with my life and just see what happens. There ain't going to be a lot I can do to change it, so I need to accept it and go along with the flow.                 Start:  07/23/24    Expected End:  07/22/25      STG: To process health and life transitions AEB self-report acceptance and maintaining wellness over the next 90 days.    ProgressTowards Goals: Progressing  Interventions: Motivational Interviewing and Supportive  Summary: Joel Bryant is a 63 y.o. male who presents with a history of adjustment disorder and alcohol use disorder, in remission. [name] appeared alert and oriented x5. He stated things are going well. He engaged in discussion, sharing some stories about his past. [name] noted he sees his doctor next month and can verify at that appointment what is needed from this writer to support his approval for liver transplant. [name] has considered going to AA. He weighed pros/cons during session. He reported the meeting he wants to attend is at his aunt's church and he would also have to attend a church service. He weighed his thoughts on this and religion as well. [name] noted he doesn't have plans for Thanksgiving. He briefly discussed family relationships and noted the lack of relationship with his children. He was receptive to input on his responsibility in reconnection with his children, even if they are adults.   Therapist Response: Conducted session with Joel Bryant. Began session with check-in/update since previous session. Utilized empathetic and reflective listening. Used open-ended questions to facilitate discussion and summarized [name]'s thoughts/feelings. Weighed pros/cons to attending church or [employer] Explored family  dynamics and highlighted parental responsibility in building connection with children, even adult children. Scheduled additional appointment and concluded session.   Suicidal/Homicidal: No  Plan: Return again in 7 weeks.  Diagnosis: Adjustment disorder, unspecified type  Alcohol use disorder, severe, in early remission (HCC)  Collaboration of Care: N/A  Patient/Guardian was advised Release of Information must be obtained prior to any record release in order to collaborate their care with an outside provider. Patient/Guardian was advised if they have not already done so to contact the registration department to sign all necessary forms in order for us  to release information regarding their care.   Consent: Patient/Guardian gives verbal consent for treatment and assignment of benefits for services provided during this visit. Patient/Guardian expressed understanding and agreed to proceed.   Almarie JONETTA Ligas, [facility] 09/17/2024

## 2024-10-14 ENCOUNTER — Other Ambulatory Visit: Payer: Self-pay | Admitting: Internal Medicine

## 2024-10-14 DIAGNOSIS — K7031 Alcoholic cirrhosis of liver with ascites: Secondary | ICD-10-CM

## 2024-10-17 ENCOUNTER — Ambulatory Visit
Admission: RE | Admit: 2024-10-17 | Discharge: 2024-10-17 | Disposition: A | Discharge status: Home / Self Care | Source: Ambulatory Visit | Attending: Internal Medicine | Admitting: Internal Medicine

## 2024-10-17 DIAGNOSIS — K7031 Alcoholic cirrhosis of liver with ascites: Secondary | ICD-10-CM | POA: Diagnosis present

## 2024-10-17 MED ORDER — IOHEXOL 300 MG/ML  SOLN
100.0000 mL | Freq: Once | INTRAMUSCULAR | Status: AC | PRN
  Administered 2024-10-17: 100 mL via INTRAVENOUS

## 2024-11-05 ENCOUNTER — Ambulatory Visit (INDEPENDENT_AMBULATORY_CARE_PROVIDER_SITE_OTHER): Admitting: Professional Counselor

## 2024-11-05 DIAGNOSIS — F1021 Alcohol dependence, in remission: Secondary | ICD-10-CM | POA: Diagnosis not present

## 2024-11-05 DIAGNOSIS — F4320 Adjustment disorder, unspecified: Secondary | ICD-10-CM | POA: Diagnosis not present

## 2024-11-05 NOTE — Progress Notes (Signed)
 " THERAPIST PROGRESS NOTE  Session Time: 10:03 AM - 10:58 AM   Participation Level: Active  Behavioral Response: Casual, Alert, Euthymic  Type of Therapy: Individual Therapy  Treatment Goals addressed: Active OP Depression  LTG: Trying to get on with my life and just see what happens. There ain't going to be a lot I can do to change it, so I need to accept it and go along with the flow.  (Progressing)    Start:  07/23/24    Expected End:  07/22/25    Goal Note Reviewed 11/05/24 - Of course, if you don't you're gonna drive yourself mad. I don't let things, I don't argue with nobody not more, it's just not worth it. My health means more to me than anything else. You got priorities you know?  STG: To process health and life transitions AEB self-report acceptance and maintaining wellness over the next 90 days.  (Progressing)  Goal Note Reviewed 11/05/24 Great. I'm still here for one day. I do what I do everyday, tend to my house, animals, and get on down the road. That's what life is all about. Pick yourself up and get on with the program. Getting out of the bed is the first goal. I take my medicine and all that stuff.   ProgressTowards Goals: Progressing  Interventions: Motivational Interviewing and Supportive  Summary: Joel Bryant is a 64 y.o. male who presents with a history of adjustment and alcohol use disorder in remission. Joel Bryant appeared alert and oriented x5. He reported he had dinner with his mom and brother for the holidays. He stated things have basically been the same. He discussed his most recent visit with his doctor. Joel Bryant noted he is scheduled with [facility] to see a liver specialist. He reported this should help determine his transplant availability. He reported he has been sober for 1 year, 4 months, and 17 days. He still hasn't attended AA meetings and engaged in discussion around this. He noted some pros/cons and obstacles to him following through. He is still open to the  idea. Joel Bryant engaged in discussion about his adult children. He is uncertain about reconnecting with them although there is a part of him that wants to. He still feels they are capable of reaching out to him if they want to. Joel Bryant noted progress on goals and areas for continued improvement.   Therapist Response: Conducted session with Joel Bryant. Began session with check-in/update since previous session. Utilized empathetic and reflective listening. Used open-ended questions to facilitate discussion and summarized Joel Bryant's thoughts/feelings. Discussed obstacles to attending AA and weighed pros/cons of going. Explored Joel Bryant's desire for reconnection with his children and thoughts/feelings that may interfere with this.  Reviewed treatment plan with input from [place] on current strengths, needs, and progress towards goals. Scheduled additional appointment and concluded session.   Suicidal/Homicidal: No  Plan: Return again in 4 weeks.  Diagnosis: Adjustment disorder, unspecified type  Alcohol use disorder, severe, in early remission [facility])  Collaboration of Care: Other provider involved in patient's care AEB chart review and Referral or follow-up with counselor/therapist AEB staff case with [facility] to consider transferring pt for care in alignment with medical provider (liver transplant)  Patient/Guardian was advised Release of Information must be obtained prior to any record release in order to collaborate their care with an outside provider. Patient/Guardian was advised if they have not already done so to contact the registration department to sign all necessary forms in order for us  to release information regarding their care.  Consent: Patient/Guardian gives verbal consent for treatment and assignment of benefits for services provided during this visit. Patient/Guardian expressed understanding and agreed to proceed.   Joel Bryant, [facility] 11/06/2024  "

## 2024-11-06 ENCOUNTER — Telehealth: Payer: Self-pay | Admitting: Professional Counselor

## 2024-11-06 NOTE — Telephone Encounter (Signed)
 Contacted [name] to discuss transfer of OPT to a [facility] to be in alignment with wants/needs of medical provider for potential liver transplant. He was informed of the options available. [name] noted he would prefer to remain at [facility] office with a male provider. Case will be transferred to [facility].

## 2024-12-03 ENCOUNTER — Ambulatory Visit: Admitting: Professional Counselor

## 2024-12-04 ENCOUNTER — Ambulatory Visit: Admitting: Podiatry

## 2024-12-04 DIAGNOSIS — M216X2 Other acquired deformities of left foot: Secondary | ICD-10-CM

## 2024-12-04 DIAGNOSIS — Q666 Other congenital valgus deformities of feet: Secondary | ICD-10-CM

## 2024-12-04 NOTE — Progress Notes (Signed)
 "  Subjective:  Patient ID: Joel Bryant, male    DOB: 1961-06-08,  MRN: [id number]  Chief Complaint  Patient presents with   Callouses    64 y.o. male presents with the above complaint.  Presents for follow-up of left submetatarsal 5 porokeratotic lesion with underlying plantarflexed metatarsal.  He states he continues to be bothersome and came back again.  He does not wear any orthotics he wears sketchers tennis shoes   Review of Systems: Negative except as noted in the HPI. Denies N/V/F/Ch.  Past Medical History:  Diagnosis Date   Anxiety    Aortic atherosclerosis 06/13/2018   July 2019 CT scan   Blind    Chronic LBP 09/13/2015   Chronic left hip pain 09/27/2015   Diabetes mellitus without complication (HCC)    Fracture, vertebra, pathologic    Gastroesophageal reflux disease without esophagitis 02/06/2024   Gout    Hyperlipidemia    Hypertension    Hypomagnesemia    Retinitis pigmentosa    Sleep apnea     Current Outpatient Medications:    atenolol  (TENORMIN ) 50 MG tablet, Take 1 tablet (50 mg total) by mouth daily., Disp: 90 tablet, Rfl: 1   atorvastatin  (LIPITOR) 20 MG tablet, TAKE ONE TABLET BY MOUTH ONCE A DAY AT SIX IN THE EVENING, Disp: 90 tablet, Rfl: 1   colchicine  0.6 MG tablet, TAKE 1 TABLET BY MOUTH TWICE DAILY AS NEEDED. TAKE ONE AND MAY REPEAT A SECONDDOSE IN 2 HOURS, MAY STOP WHEN PAIN RESOLVES (Patient not taking: Reported on 08/06/2024), Disp: 30 tablet, Rfl: 1   Cyanocobalamin  (B-12) 1000 MCG SUBL, Place 1 tablet under the tongue daily., Disp: 100 tablet, Rfl: 1   esomeprazole  (NEXIUM ) 40 MG capsule, Take 1 capsule (40 mg total) by mouth daily., Disp: 90 capsule, Rfl: 1   furosemide  (LASIX ) 40 MG tablet, Take 1 tablet (40 mg total) by mouth daily., Disp: 90 tablet, Rfl: 1   lisinopril  (ZESTRIL ) 2.5 MG tablet, Take 1 tablet (2.5 mg total) by mouth daily., Disp: 90 tablet, Rfl: 1   omega-3 acid ethyl esters (LOVAZA ) 1 g capsule, Take 2 capsules (2 g total) by  mouth 2 (two) times daily., Disp: 360 capsule, Rfl: 1   spironolactone  (ALDACTONE ) 100 MG tablet, Take 100 mg by mouth daily. (Patient not taking: Reported on 08/06/2024), Disp: , Rfl:   Social History   Tobacco Use  Smoking Status Some Days   Current packs/day: 0.75   Average packs/day: 0.8 packs/day for 35.0 years (26.3 ttl pk-yrs)   Types: Cigarettes  Smokeless Tobacco Never  Tobacco Comments   Quit smoking for 9 years, started again Feb 2022   Started at age 31 and smokes for a total of 30 plus years     No Known Allergies Objective:  There were no vitals filed for this visit. There is no height or weight on file to calculate BMI. Constitutional Well developed. Well nourished.  Vascular Dorsalis pedis pulses palpable bilaterally. Posterior tibial pulses palpable bilaterally. Capillary refill normal to all digits.  No cyanosis or clubbing noted. Pedal hair growth normal.  Neurologic Normal speech. Oriented to person, place, and time. Epicritic sensation to light touch grossly present bilaterally.  Dermatologic Left submetatarsal 5 porokeratosis with central nucleated core pain on palpation to the lesion no open wounds noted.  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   Radiographs: None Assessment:  1. Plantar flexed metatarsal, left   2. Pes planovalgus     Plan:  Patient was evaluated and treated and all questions answered.  Left submetatarsal 5 porokeratosis/benign skin lesion (chronic stable condition) - All questions and concerns were discussed with the patient in extensive detail given the amount of pain that he is having he will benefit from debridement of the lesion using chisel blade and under the lesion was debride down to healthy discharge tissue no complication noted no pinpoint bleeding noted. - Shoe gear modification discussed  Pes  planovalgus/foot deformity (chronic stable condition) -I explained to patient the etiology of pes planovalgus and relationship with heel pain/arch pain and various treatment options were discussed.  Given patient foot structure in the setting of heel pain/arch pain I believe patient will benefit from custom-made orthotics to help control the hindfoot motion support the arch of the foot and take the stress away from arches.  Patient agrees with the plan like to proceed with orthotics -Patient was casted for orthotics   No follow-ups on file.    "

## 2024-12-16 ENCOUNTER — Ambulatory Visit

## 2025-02-04 ENCOUNTER — Ambulatory Visit: Admitting: Family Medicine

## 2025-02-16 ENCOUNTER — Ambulatory Visit

## 2025-02-20 ENCOUNTER — Ambulatory Visit

## 2025-02-27 ENCOUNTER — Ambulatory Visit
